# Patient Record
Sex: Female | Born: 1980
Health system: Southern US, Community
[De-identification: ages and names within clinical notes are randomized; demographics above are authoritative.]

## PROBLEM LIST (undated history)

## (undated) ENCOUNTER — Inpatient Hospital Stay (HOSPITAL_COMMUNITY): Payer: Self-pay

## (undated) DIAGNOSIS — F419 Anxiety disorder, unspecified: Secondary | ICD-10-CM

## (undated) DIAGNOSIS — I639 Cerebral infarction, unspecified: Secondary | ICD-10-CM

## (undated) DIAGNOSIS — D649 Anemia, unspecified: Secondary | ICD-10-CM

## (undated) DIAGNOSIS — R002 Palpitations: Secondary | ICD-10-CM

## (undated) DIAGNOSIS — R011 Cardiac murmur, unspecified: Secondary | ICD-10-CM

## (undated) DIAGNOSIS — R569 Unspecified convulsions: Secondary | ICD-10-CM

## (undated) DIAGNOSIS — G459 Transient cerebral ischemic attack, unspecified: Secondary | ICD-10-CM

## (undated) DIAGNOSIS — R079 Chest pain, unspecified: Secondary | ICD-10-CM

## (undated) HISTORY — DX: Transient cerebral ischemic attack, unspecified: G45.9

## (undated) HISTORY — DX: Palpitations: R00.2

## (undated) HISTORY — DX: Cardiac murmur, unspecified: R01.1

## (undated) HISTORY — DX: Unspecified convulsions: R56.9

## (undated) HISTORY — PX: OTHER SURGICAL HISTORY: SHX169

## (undated) HISTORY — PX: ABDOMINAL HYSTERECTOMY: SHX81

## (undated) HISTORY — DX: Anxiety disorder, unspecified: F41.9

## (undated) HISTORY — DX: Chest pain, unspecified: R07.9

---
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
## 1999-05-31 ENCOUNTER — Other Ambulatory Visit: Admission: RE | Admit: 1999-05-31 | Discharge: 1999-05-31 | Payer: Self-pay | Admitting: *Deleted

## 2000-06-01 ENCOUNTER — Other Ambulatory Visit: Admission: RE | Admit: 2000-06-01 | Discharge: 2000-06-01 | Payer: Self-pay | Admitting: *Deleted

## 2001-11-20 ENCOUNTER — Emergency Department (HOSPITAL_COMMUNITY): Admission: EM | Admit: 2001-11-20 | Discharge: 2001-11-21 | Payer: Self-pay | Admitting: Emergency Medicine

## 2001-11-21 ENCOUNTER — Encounter: Payer: Self-pay | Admitting: Emergency Medicine

## 2002-07-31 ENCOUNTER — Emergency Department (HOSPITAL_COMMUNITY): Admission: EM | Admit: 2002-07-31 | Discharge: 2002-07-31 | Payer: Self-pay

## 2004-09-20 ENCOUNTER — Inpatient Hospital Stay (HOSPITAL_COMMUNITY): Admission: AC | Admit: 2004-09-20 | Discharge: 2004-09-25 | Payer: Self-pay

## 2004-10-19 ENCOUNTER — Ambulatory Visit (HOSPITAL_COMMUNITY): Admission: RE | Admit: 2004-10-19 | Discharge: 2004-10-20 | Payer: Self-pay | Admitting: Orthopedic Surgery

## 2004-12-17 ENCOUNTER — Other Ambulatory Visit: Admission: RE | Admit: 2004-12-17 | Discharge: 2004-12-17 | Payer: Self-pay | Admitting: Obstetrics and Gynecology

## 2004-12-28 ENCOUNTER — Emergency Department (HOSPITAL_COMMUNITY): Admission: EM | Admit: 2004-12-28 | Discharge: 2004-12-28 | Payer: Self-pay | Admitting: Emergency Medicine

## 2005-02-05 ENCOUNTER — Emergency Department (HOSPITAL_COMMUNITY): Admission: EM | Admit: 2005-02-05 | Discharge: 2005-02-05 | Payer: Self-pay | Admitting: *Deleted

## 2008-05-30 ENCOUNTER — Other Ambulatory Visit: Admission: RE | Admit: 2008-05-30 | Discharge: 2008-05-30 | Payer: Self-pay | Admitting: Family Medicine

## 2009-03-13 ENCOUNTER — Emergency Department (HOSPITAL_COMMUNITY): Admission: EM | Admit: 2009-03-13 | Discharge: 2009-03-13 | Payer: Self-pay | Admitting: Emergency Medicine

## 2010-03-11 ENCOUNTER — Emergency Department (HOSPITAL_COMMUNITY): Admission: EM | Admit: 2010-03-11 | Discharge: 2010-03-12 | Payer: Self-pay | Admitting: Emergency Medicine

## 2010-09-17 ENCOUNTER — Encounter: Payer: Self-pay | Admitting: Family Medicine

## 2010-09-27 ENCOUNTER — Encounter: Payer: Self-pay | Admitting: Family Medicine

## 2010-11-03 ENCOUNTER — Other Ambulatory Visit: Payer: Self-pay | Admitting: Family Medicine

## 2010-11-03 MED ORDER — FLUCONAZOLE 150 MG PO TABS: 150.0000 mg | ORAL_TABLET | Freq: Once | ORAL | Status: AC

## 2010-12-30 ENCOUNTER — Other Ambulatory Visit: Payer: Self-pay | Admitting: Family Medicine

## 2010-12-30 MED ORDER — AMOXICILLIN 500 MG PO CAPS: 500.0000 mg | ORAL_CAPSULE | Freq: Two times a day (BID) | ORAL | Status: AC
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
## 2010-12-31 ENCOUNTER — Ambulatory Visit (INDEPENDENT_AMBULATORY_CARE_PROVIDER_SITE_OTHER): Payer: 59 | Admitting: Family Medicine

## 2010-12-31 VITALS — BP 137/89 | HR 95 | Ht 67.0 in | Wt 150.0 lb

## 2010-12-31 DIAGNOSIS — R569 Unspecified convulsions: Secondary | ICD-10-CM | POA: Insufficient documentation

## 2010-12-31 MED ORDER — TRAMADOL HCL 50 MG PO TABS: ORAL_TABLET | ORAL | Status: AC

## 2010-12-31 NOTE — Progress Notes (Signed)
  Subjective:    Patient ID: Anita [name], female    DOB: 1981/05/25, 30 y.o.   MRN: [id number]  HPI  Tongue pain We had started her on abx for her skin scrapes and thiose are less red---her tongue is still really sore...more no than initially---where she but it during sz  Review of Systems Pertinent review of systems: negative for fever or unusual weight change.     Objective:   Physical Exam    WDWNNAD OP tip of tongue has linear laceration that does not appear infected. NECK no LAD SKIN Decreased erythema around her various scrapes    Assessment & Plan:  Tongue laceration conintue oral rinses tid and tramadol for pain
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
## 2011-01-26 ENCOUNTER — Other Ambulatory Visit: Payer: Self-pay | Admitting: Family Medicine

## 2011-01-26 DIAGNOSIS — B9689 Other specified bacterial agents as the cause of diseases classified elsewhere: Secondary | ICD-10-CM | POA: Insufficient documentation

## 2011-01-26 DIAGNOSIS — N760 Acute vaginitis: Secondary | ICD-10-CM | POA: Insufficient documentation

## 2011-01-26 MED ORDER — CLINDAMYCIN PHOSPHATE 2 % VA CREA: 1.0000 | TOPICAL_CREAM | Freq: Every day | VAGINAL | Status: DC

## 2011-02-02 ENCOUNTER — Ambulatory Visit (INDEPENDENT_AMBULATORY_CARE_PROVIDER_SITE_OTHER): Payer: 59 | Admitting: Family Medicine

## 2011-02-02 DIAGNOSIS — B3731 Acute candidiasis of vulva and vagina: Secondary | ICD-10-CM

## 2011-02-02 DIAGNOSIS — B373 Candidiasis of vulva and vagina: Secondary | ICD-10-CM

## 2011-02-02 MED ORDER — TERCONAZOLE 0.4 % VA CREA: 1.0000 | TOPICAL_CREAM | Freq: Every day | VAGINAL | Status: AC

## 2011-02-02 NOTE — Progress Notes (Signed)
  Subjective:    Patient ID: Anita [name], female    DOB: 02-25-1981, 30 y.o.   MRN: [id number]  HPI  Several days of itchy vaginal d/c. Sex was painful as skin was irritated. Finished the cliindamycin.   Review of Systems    Pertinent review of systems: negative for fever or unusual weight change.  Objective:   Physical Exam    GU: red vular area with thick white d/c    Assessment & Plan:  Yeast vaginitis terrazol 7 d

## 2011-02-16 ENCOUNTER — Ambulatory Visit (INDEPENDENT_AMBULATORY_CARE_PROVIDER_SITE_OTHER): Payer: 59 | Admitting: Family Medicine

## 2011-02-17 ENCOUNTER — Other Ambulatory Visit: Payer: Self-pay | Admitting: Family Medicine

## 2011-02-17 DIAGNOSIS — G43909 Migraine, unspecified, not intractable, without status migrainosus: Secondary | ICD-10-CM | POA: Insufficient documentation

## 2011-02-17 DIAGNOSIS — R569 Unspecified convulsions: Secondary | ICD-10-CM

## 2011-02-17 MED ORDER — LAMOTRIGINE 200 MG PO TABS: 200.0000 mg | ORAL_TABLET | Freq: Two times a day (BID) | ORAL | Status: DC

## 2011-02-17 MED ORDER — KETOROLAC TROMETHAMINE 30 MG/ML IJ SOLN
30.0000 mg | Freq: Once | INTRAMUSCULAR | Status: AC
  Administered 2011-02-17: 30 mg via INTRAMUSCULAR

## 2011-02-17 NOTE — Assessment & Plan Note (Signed)
Pt is at work and feels like she has a seizure coming on. She does not have her meds with her and needs 1 Lamictal tablet.  Sent to [facility] outpt pharmacy now and she will go pick it up now.

## 2011-02-17 NOTE — Progress Notes (Signed)
Anita [name] describes a migraine HA all day, unilateral on the left, associated with nausea.  She is at work and she just spoke with her neurologist who plans to place her on Imitrex.  She requested intervention.  I am uncomfortable with ordering injectable Imitrex as that is the only triptan we have in stock.  Will have the clinic staff administer Toradol 30 mg IM once.

## 2011-02-21 ENCOUNTER — Ambulatory Visit (INDEPENDENT_AMBULATORY_CARE_PROVIDER_SITE_OTHER): Payer: 59 | Admitting: Family Medicine

## 2011-02-21 ENCOUNTER — Encounter: Payer: Self-pay | Admitting: Family Medicine

## 2011-02-21 VITALS — BP 120/78 | Wt 151.0 lb

## 2011-02-21 DIAGNOSIS — N898 Other specified noninflammatory disorders of vagina: Secondary | ICD-10-CM

## 2011-02-21 DIAGNOSIS — IMO0002 Reserved for concepts with insufficient information to code with codable children: Secondary | ICD-10-CM | POA: Insufficient documentation

## 2011-02-21 LAB — POCT WET PREP (WET MOUNT)

## 2011-02-21 NOTE — Patient Instructions (Signed)
Recommend OTC yeast cream for 3 nights (miconazole or clotrimazole cream, not suppository)  Only use KY jelly, do not use scented lubricants Recommend not aggressively shaving, this is irritating.

## 2011-02-21 NOTE — Miscellaneous (Signed)
Summary: Metronidazole  Clinical Lists Changes  Medications: Added new medication of METRONIDAZOLE 500 MG TABS (METRONIDAZOLE) 1 Po BId x 14 days. - Signed Rx of METRONIDAZOLE 500 MG TABS (METRONIDAZOLE) 1 Po BId x 14 days.;  #28 x 0;  Signed;  Entered by: Jamie Brookes MD;  Authorized by: Jamie Brookes MD;  Method used: Electronically to [facility]*, [address] Shipping/mailing, [place], [place]  [address], Ph: [phone], Fax: [phone]    Prescriptions: METRONIDAZOLE 500 MG TABS (METRONIDAZOLE) 1 Po BId x 14 days.  #28 x 0   Entered and Authorized by:   Jamie Brookes MD   Signed by:   Jamie Brookes MD on 09/27/2010   Method used:   Electronically to        [facility]* (retail)       [address]       [address] [place] [address]mailing       [place], [place]  [address]       Ph: [phone]       Fax: [phone]   RxID:   [id number]

## 2011-02-21 NOTE — Progress Notes (Signed)
  Subjective:    Patient ID: Anita [name], female    DOB: [birth year], 30 y.o.   MRN: [id number]  HPI Complaining or redness and itching in vaginal area.  Discomfort with sexual intercourse.  Boyfriend has irritation at the meatus.  She had a vaginal yeast infection treated recently.  She would like STD testing.       Review of Systems  Constitutional: Negative for fever.  Genitourinary: Positive for vaginal pain, pelvic pain and dyspareunia. Negative for dysuria and vaginal discharge.       Objective:   Physical Exam  Constitutional: She appears well-developed and well-nourished.  Genitourinary:       Externally:  Shaved pubic hair and overall redness of external and internal labia.  No discharge in vaginal canal or from cervical os.  Tender during swabbing of cervix.  No pain with bimanual exam.  Negative wet prep.          Assessment & Plan:

## 2011-02-21 NOTE — Assessment & Plan Note (Signed)
Recent treatment for vaginal candidiasis, and weekend of sexual activity.  Recommended the following: use of OTC vaginal yeast cream for 3 nights to provide comfort.  Use of KY jelly only, not scented or warming lubricants.  Recommended not to shave pubic hair as it is a protective barrier.  Advised to have less trauma during intercourse.

## 2011-02-21 NOTE — Miscellaneous (Signed)
  Clinical Lists Changes Wet prep + yeast; using OTC cream that is causing increased irritation. Medications: Added new medication of FLUCONAZOLE 150 MG TABS (FLUCONAZOLE) one today and one in 2 days - Signed Rx of FLUCONAZOLE 150 MG TABS (FLUCONAZOLE) one today and one in 2 days;  #2 x 1;  Signed;  Entered by: Luretha Murphy NP;  Authorized by: Luretha Murphy NP;  Method used: Electronically to [facility]*, [address] Shipping/mailing, [place], [place]  [address], Ph: [phone], Fax: [phone]    Prescriptions: FLUCONAZOLE 150 MG TABS (FLUCONAZOLE) one today and one in 2 days  #2 x 1   Entered and Authorized by:   Luretha Murphy NP   Signed by:   Luretha Murphy NP on 09/17/2010   Method used:   Electronically to        [facility]* (retail)       [address]       [address] Shipping/mailing       [place], [place]  [address]       Ph: [phone]       Fax: [phone]   RxID:   [id number]

## 2011-02-22 LAB — GC/CHLAMYDIA PROBE AMP, GENITAL: Chlamydia, DNA Probe: NEGATIVE

## 2011-03-01 NOTE — Progress Notes (Signed)
  Subjective:    Patient ID: Anita [name], female    DOB: 1981-02-08, 30 y.o.   MRN: [id number]  HPI  Appointment cancelled  Review of Systems     Objective:   Physical Exam        Assessment & Plan:

## 2011-04-07 ENCOUNTER — Other Ambulatory Visit: Payer: Self-pay | Admitting: Family Medicine

## 2011-04-07 ENCOUNTER — Encounter: Payer: Self-pay | Admitting: Family Medicine

## 2011-04-07 ENCOUNTER — Emergency Department (HOSPITAL_COMMUNITY)
Admission: EM | Admit: 2011-04-07 | Discharge: 2011-04-07 | Disposition: A | Discharge status: Home / Self Care | Payer: 59 | Attending: Emergency Medicine | Admitting: Emergency Medicine

## 2011-04-07 DIAGNOSIS — R569 Unspecified convulsions: Secondary | ICD-10-CM

## 2011-04-07 DIAGNOSIS — R51 Headache: Secondary | ICD-10-CM | POA: Insufficient documentation

## 2011-04-07 DIAGNOSIS — G40909 Epilepsy, unspecified, not intractable, without status epilepticus: Secondary | ICD-10-CM | POA: Insufficient documentation

## 2011-04-07 DIAGNOSIS — Z79899 Other long term (current) drug therapy: Secondary | ICD-10-CM | POA: Insufficient documentation

## 2011-04-07 LAB — CBC
HCT: 40.8 % (ref 36.0–46.0)
Hemoglobin: 14 g/dL (ref 12.0–15.0)
MCHC: 34.3 g/dL (ref 30.0–36.0)
RBC: 4.6 MIL/uL (ref 3.87–5.11)

## 2011-04-07 LAB — DIFFERENTIAL
Basophils Absolute: 0 10*3/uL (ref 0.0–0.1)
Lymphocytes Relative: 27 % (ref 12–46)
Monocytes Absolute: 0.9 10*3/uL (ref 0.1–1.0)
Monocytes Relative: 11 % (ref 3–12)
Neutro Abs: 4.9 10*3/uL (ref 1.7–7.7)

## 2011-04-07 LAB — BASIC METABOLIC PANEL
BUN: 8 mg/dL (ref 6–23)
CO2: 24 mEq/L (ref 19–32)
Chloride: 102 mEq/L (ref 96–112)
GFR calc non Af Amer: >60 mL/min (ref >60)
Glucose, Bld: 89 mg/dL (ref 70–99)
Potassium: 3.5 mEq/L (ref 3.5–5.1)

## 2011-04-07 MED ORDER — CLONAZEPAM 0.5 MG PO TABS: 0.5000 mg | ORAL_TABLET | Freq: Every evening | ORAL | Status: DC | PRN

## 2011-04-07 MED ORDER — LORAZEPAM 2 MG/ML IJ SOLN
2.0000 mg | Freq: Once | INTRAMUSCULAR | Status: AC
  Administered 2011-04-07: 2 mg via INTRAMUSCULAR

## 2011-04-07 MED ORDER — LACOSAMIDE 50 MG PO TABS: 50.0000 mg | ORAL_TABLET | Freq: Two times a day (BID) | ORAL | Status: DC

## 2011-04-07 MED ORDER — LAMOTRIGINE 200 MG PO TABS: ORAL_TABLET | ORAL | Status: DC

## 2011-04-07 NOTE — Progress Notes (Signed)
Seen by me at [facility] with seizurea activity this am.   Had two episodes of "blacking out" un witnessed as she was riding bus to work. Then at work she developed headache and aura and had seizure activity x2.  Loss of bladder. Post ictal.  1st episode lasted a few minutes per witnesses and second lasted about 20 minutes--she received 2 mg lorazepam Im injected by me at 15 min or so into her last episode.  She did not fall--her co workers noticed she was acting odd and were there to help her to floor and place pillow under her head.  Specific pertinents on ROS.Denies any recent illness, had headache this am bu that is frequent part of her aura per patient. Denies any recent fever, cough, no dysuria no sweats, no nausea or Gi upset. No alcohol in may months. Is taking her lamictal regularly. Has had no migraines in last few weeks. No new stressors, no change in sleep patter or eating habits. No weight change. No menstrual irregularities. ROS is otherwise negative.  PE: BP during sz was 200 /110 and post ictal was 140 / 80.   GENERALl: Well developed, well nourished, in no acute distress. NECK: Supple, FROM, without lymphadenopathy.   PERRLA EOMI. nonicteric sclera THYROID: normal without nodularity CAROTID ARTERIES: without bruits LUNGS: clear to auscultation bilaterally. No wheezes or rales. HEART: Regular rate and rhythm, no murmurs ABDOMEN: soft with positive bowel sounds SKIN no rash. Tatoos. Normal color and normal cap refill PSTCH A xO x 4 atthis time. Interactive and asking and answering questions appropriately. NEURO: No gross focal deficits after she recovered from post ictal period.

## 2011-04-07 NOTE — Progress Notes (Signed)
Lorazepam  2 mg  IM drawn up by Arlyss Repress CMA and  administered by Dr. Jennette Kettle.

## 2011-04-07 NOTE — Progress Notes (Signed)
Addended by: Orvil Feil L on: 04/07/2011 08:53 AM   Modules accepted: Orders

## 2011-04-07 NOTE — Progress Notes (Signed)
  Subjective:    Patient ID: Anita [name], female    DOB: Jul 26, 1981, 30 y.o.   MRN: [id number]  HPI  She reports additional med changes of increase inlamictal 200 am and 300pm with klonopin 0.5 qhs.   Review of Systems     Objective:   Physical Exam        Assessment & Plan:

## 2011-04-07 NOTE — Assessment & Plan Note (Signed)
tnsf to ED where we will get CBC, UA, UDS, lanmictal level. Her Mom is on her way over. She is resting comfortabley. SPoke w her neurologist Dr Terrace Arabia and see pt instructions for our treatment plan. Add vimpat 50 bid and she has f/u Dr Terrace Arabia already scheduled end of September. She will be transported home by her mom.

## 2011-04-07 NOTE — Patient Instructions (Addendum)
I have spoke with Dr Eldridge Scot [phone] and advised her of seizure activity this am .Dr Terrace Arabia suggests we add vimpat 5o bid and Norma keeps her existing appt at end of September.  I will forard lamictal level to Dr Terrace Arabia. Dr Terrace Arabia asks Emmogene to call her office in interim with any new issues or concerns. She can also follow up at [facility] with me, Dr Jennette Kettle.

## 2011-04-07 NOTE — Progress Notes (Signed)
30 yo female with known seizure disorder who had two witnessed seizures this morning upon arrival to work at [facility] center. By report she had two "black out" episodes on the bus on the way to work this morning.  Her first seizure lasted over 1 minute and was generalized tonic clonic. There was an interval of postictal confusion for around 2 mins followed by another seizure which lasted about 1 minute. Following this seizure Anita [name] had continued confusion but improving mental status. She was given 2mg  im ativan and EMS was called for transfer to the ED.   Patient Active Problem List  Diagnoses  . Seizure  . Migraine  . Dyspareunia    Current Outpatient Prescriptions on File Prior to Visit  Medication Sig Dispense Refill  . lamoTRIgine (LAMICTAL) 200 MG tablet Take 1 tablet (200 mg total) by mouth 2 (two) times daily.  1 tablet  0

## 2011-04-10 LAB — LAMOTRIGINE LEVEL: Lamotrigine Lvl: 8.6 ug/mL (ref 4.0–18.0)

## 2011-05-31 ENCOUNTER — Other Ambulatory Visit: Payer: Self-pay | Admitting: Family Medicine

## 2011-11-03 ENCOUNTER — Other Ambulatory Visit: Payer: Self-pay | Admitting: Family Medicine

## 2011-11-03 ENCOUNTER — Other Ambulatory Visit (INDEPENDENT_AMBULATORY_CARE_PROVIDER_SITE_OTHER): Payer: 59

## 2011-11-03 DIAGNOSIS — N760 Acute vaginitis: Secondary | ICD-10-CM

## 2011-11-03 LAB — POCT WET PREP (WET MOUNT)

## 2011-11-03 MED ORDER — TERCONAZOLE 0.4 % VA CREA: 1.0000 | TOPICAL_CREAM | Freq: Every day | VAGINAL | Status: AC

## 2011-11-03 NOTE — Progress Notes (Signed)
Vaginal itching w d/c, burning. Wet prep done the other day showed yeast, she had diflucan x 2 and no improvement. Exam c/w yeast with white d/c, reddened vestibule. No lesions that would be concerning for herpes. Will try terazol to get C. Lillia Corporal

## 2012-04-17 ENCOUNTER — Ambulatory Visit (INDEPENDENT_AMBULATORY_CARE_PROVIDER_SITE_OTHER): Payer: 59 | Admitting: Family Medicine

## 2012-04-17 VITALS — HR 75 | Temp 98.0°F | Resp 18

## 2012-04-17 DIAGNOSIS — N949 Unspecified condition associated with female genital organs and menstrual cycle: Secondary | ICD-10-CM

## 2012-04-17 DIAGNOSIS — R102 Pelvic and perineal pain: Secondary | ICD-10-CM | POA: Insufficient documentation

## 2012-04-17 NOTE — Assessment & Plan Note (Addendum)
With positive finding of right adnexal mass and mild cervical motion tenderness we will consider in the differential ectopic pregnancy, anatomic pathology (cysts) and PID. Ectopic Pregnancy: Pt just came of her period and denies the possibility of pregnancy. Anatomic pathology: pt has history of ovarian cysts and with mass palpated this could be a possibility. Mild to moderated PID: pt with symptoms and signs that can be consistent with this diagnosis. At this time pt does not seem to have an acute abdomen.  Plan: Dicussed signs and symptoms of worsening condition and the need of reevaluation if this happens.  Pelvic ultrasound and CBC w/diff. If by u/s ectopic is concerning will obtain Bhcg quantification. Due to the absence of fever, signs of peritonitis and PO tolerability I will be incline to treat PID as outpatient basis. The mass palpated is worrisome for possibility of tubal/ovary involvement. Ultrasound will guide [place] more in this respect. The antibiotic regimen for this condition will be Ceftriaxone 250 mg IM x1. Plus Metronidazole 500 mg PO BID and Doxycycline 100 mg PO BID for 14 days course.

## 2012-04-17 NOTE — Progress Notes (Signed)
  Subjective:    Patient ID: Anita [name], female    DOB: Aug 27, 1981, 31 y.o.   MRN: [id number]  HPI Pt seen today for pelvic pain. She has been having abdominal/pelvic discomfort for a while sometimes before her periods others after her menses. At this time the pain started after her LMP. Pain is described as dull with the sensation of "something hardened/inflamed inside". Pain exacerbates with sexual intercourse. She also complaints of mild to moderated white vaginal discharge, denies vaginal bleeding.She mentions that when she was around 31 y/o was diagnosed with ovarian cysts and has not had recent follow up. She Pt denies fever or chills, nausea or vomiting.  Review of Systems Per HPI    Objective:   Physical Exam  Constitutional: She is oriented to person, place, and time. No distress.  Cardiovascular: Normal rate.   Pulmonary/Chest: Effort normal.  Abdominal: Soft. She exhibits no distension.  Genitourinary:       Speculum: mild thin vaginal discharge. Nulliparous OS. No cervical erythema or friability. Bimanual exam: very posterior cervix, mild cervical motion tenderness. Tender mass of approximately 2cm  palpated on the right adnexa. Left adnexa non palpable. Uterus normal size.   Musculoskeletal: She exhibits no edema.  Neurological: She is alert and oriented to person, place, and time.       Assessment & Plan:

## 2012-04-18 ENCOUNTER — Other Ambulatory Visit (INDEPENDENT_AMBULATORY_CARE_PROVIDER_SITE_OTHER): Payer: 59 | Admitting: Family Medicine

## 2012-04-18 ENCOUNTER — Other Ambulatory Visit: Payer: Self-pay | Admitting: Family Medicine

## 2012-04-18 DIAGNOSIS — R102 Pelvic and perineal pain: Secondary | ICD-10-CM

## 2012-04-19 ENCOUNTER — Encounter: Payer: Self-pay | Admitting: Family Medicine

## 2012-04-19 ENCOUNTER — Ambulatory Visit (HOSPITAL_COMMUNITY)
Admission: RE | Admit: 2012-04-19 | Discharge: 2012-04-19 | Disposition: A | Discharge status: Home / Self Care | Payer: 59 | Source: Ambulatory Visit | Attending: Family Medicine | Admitting: Family Medicine

## 2012-04-19 DIAGNOSIS — R102 Pelvic and perineal pain: Secondary | ICD-10-CM

## 2012-04-19 DIAGNOSIS — N949 Unspecified condition associated with female genital organs and menstrual cycle: Secondary | ICD-10-CM | POA: Insufficient documentation

## 2012-04-19 LAB — CBC WITH DIFFERENTIAL/PLATELET
Basophils Absolute: 0.1 10*3/uL (ref 0.0–0.1)
Basophils Relative: 1 % (ref 0–1)
Eosinophils Relative: 2 % (ref 0–5)
HCT: 38.9 % (ref 36.0–46.0)
Hemoglobin: 13.2 g/dL (ref 12.0–15.0)
MCH: 29.9 pg (ref 26.0–34.0)
MCHC: 33.9 g/dL (ref 30.0–36.0)
MCV: 88 fL (ref 78.0–100.0)
Monocytes Absolute: 1 10*3/uL (ref 0.1–1.0)
Monocytes Relative: 12 % (ref 3–12)
RDW: 12.8 % (ref 11.5–15.5)

## 2012-04-19 MED ORDER — DOXYCYCLINE HYCLATE 100 MG PO TABS: 100.0000 mg | ORAL_TABLET | Freq: Two times a day (BID) | ORAL | Status: AC

## 2012-04-19 MED ORDER — CEFTRIAXONE SODIUM 250 MG IJ SOLR
250.0000 mg | Freq: Once | INTRAMUSCULAR | Status: AC
  Administered 2012-04-19: 250 mg via INTRAMUSCULAR

## 2012-04-19 MED ORDER — METRONIDAZOLE 500 MG PO TABS: 500.0000 mg | ORAL_TABLET | Freq: Two times a day (BID) | ORAL | Status: AC

## 2012-04-19 MED ORDER — CEFTRIAXONE SODIUM 250 MG IJ SOLR: 250.0000 mg | Freq: Once | INTRAMUSCULAR | Status: AC

## 2012-04-19 NOTE — Addendum Note (Signed)
Addended by: Deno Etienne on: 04/19/2012 02:02 PM   Modules accepted: Orders

## 2012-05-02 ENCOUNTER — Other Ambulatory Visit: Payer: Self-pay | Admitting: Family Medicine

## 2012-05-02 DIAGNOSIS — R102 Pelvic and perineal pain: Secondary | ICD-10-CM

## 2012-05-02 NOTE — Progress Notes (Signed)
Continued pelvic pain Increased clots with menstruation; increased dysmenorrhea PUS normal Clinically sounds like adenomyosis Will set up pelvic mri Denny Levy

## 2012-05-02 NOTE — Progress Notes (Signed)
Pt has an appt for Monday September 10 @ 445 with [facility] radiology. Pt has been made aware of this appt.Loralee Pacas [place]

## 2012-05-08 ENCOUNTER — Inpatient Hospital Stay (HOSPITAL_COMMUNITY): Admission: RE | Admit: 2012-05-08 | Payer: 59 | Source: Ambulatory Visit

## 2012-05-11 ENCOUNTER — Ambulatory Visit (HOSPITAL_COMMUNITY)
Admission: RE | Admit: 2012-05-11 | Discharge: 2012-05-11 | Disposition: A | Discharge status: Home / Self Care | Payer: 59 | Source: Ambulatory Visit | Attending: Family Medicine | Admitting: Family Medicine

## 2012-05-11 DIAGNOSIS — R102 Pelvic and perineal pain: Secondary | ICD-10-CM

## 2012-05-11 DIAGNOSIS — N949 Unspecified condition associated with female genital organs and menstrual cycle: Secondary | ICD-10-CM | POA: Insufficient documentation

## 2012-05-11 DIAGNOSIS — N72 Inflammatory disease of cervix uteri: Secondary | ICD-10-CM | POA: Insufficient documentation

## 2012-05-14 ENCOUNTER — Other Ambulatory Visit: Payer: Self-pay | Admitting: Family Medicine

## 2012-05-14 DIAGNOSIS — E282 Polycystic ovarian syndrome: Secondary | ICD-10-CM | POA: Insufficient documentation

## 2012-05-14 MED ORDER — DESOGESTREL-ETHINYL ESTRADIOL 0.15-30 MG-MCG PO TABS: 1.0000 | ORAL_TABLET | Freq: Every day | ORAL | Status: DC

## 2012-08-27 ENCOUNTER — Other Ambulatory Visit (HOSPITAL_COMMUNITY): Payer: Self-pay | Admitting: Neurology

## 2012-08-27 DIAGNOSIS — G40309 Generalized idiopathic epilepsy and epileptic syndromes, not intractable, without status epilepticus: Secondary | ICD-10-CM

## 2012-08-28 ENCOUNTER — Ambulatory Visit (HOSPITAL_COMMUNITY)
Admission: RE | Admit: 2012-08-28 | Discharge: 2012-08-28 | Disposition: A | Discharge status: Home / Self Care | Payer: 59 | Source: Ambulatory Visit | Attending: Neurology | Admitting: Neurology

## 2012-08-28 DIAGNOSIS — G40309 Generalized idiopathic epilepsy and epileptic syndromes, not intractable, without status epilepticus: Secondary | ICD-10-CM

## 2012-11-28 ENCOUNTER — Other Ambulatory Visit: Payer: Self-pay | Admitting: Family Medicine

## 2012-11-28 DIAGNOSIS — R5381 Other malaise: Secondary | ICD-10-CM

## 2012-11-28 DIAGNOSIS — R5383 Other fatigue: Secondary | ICD-10-CM

## 2012-11-29 ENCOUNTER — Other Ambulatory Visit: Payer: Self-pay | Admitting: Family Medicine

## 2012-11-29 DIAGNOSIS — R5381 Other malaise: Secondary | ICD-10-CM

## 2012-11-29 DIAGNOSIS — R5383 Other fatigue: Secondary | ICD-10-CM

## 2012-11-29 LAB — TSH: TSH: 1.55 u[IU]/mL (ref 0.350–4.500)

## 2012-12-07 ENCOUNTER — Other Ambulatory Visit: Payer: Self-pay

## 2012-12-07 NOTE — Telephone Encounter (Signed)
I did not change Lamictal dose. Continue as I previously prescribed. Will get a Lamictal level at next visit on the 28th of April.

## 2012-12-07 NOTE — Telephone Encounter (Signed)
I called patient back to relay that Eber Jones would like the patient to remain on meds as prescribed.  Patient verbalized understanding and said she would talk to CM about meds when she is in for appt.  I called pharmacy as well to advise no med change at this time.  Spoke with Brett Canales.  He is aware patient should remain on same dose.

## 2012-12-07 NOTE — Telephone Encounter (Signed)
Patient called back.  I spoke with her.  She said she is now taking Lamictal 200mg  tabs one and one half twice daily.  Says she has also cut back her Vimpat from 200mg  twice daily to 200mg  once daily.  Says she has an appt with CM later this month.  She claims she discussed the Lamictal Dose change with provider here, however I cannot seem to find anything in system regarding this.  Said she will speak with Eber Jones about Vimpat dose at her OV and the only med she needs at this time is Lamictal at new dose.  Eber Jones, please advise.  Thank you.

## 2012-12-07 NOTE — Telephone Encounter (Signed)
Pharmacy is requesting refills.  Brett Canales called [place] (from pharmacy) stating Mom called them and told them the dose has changed on Lamictal and Vimpat and they need new rx's.  I do not show a dose change in system.  I called home number, got no answer, left message.  I called alternate number [phone], it was a work line.  Got no answer, was put into VM for physicians and pharmacist.  I left a message asking that Lupita Leash call us back regarding Keyri.

## 2012-12-10 ENCOUNTER — Ambulatory Visit: Payer: 59 | Admitting: Family Medicine

## 2012-12-10 ENCOUNTER — Other Ambulatory Visit (INDEPENDENT_AMBULATORY_CARE_PROVIDER_SITE_OTHER): Payer: 59 | Admitting: *Deleted

## 2012-12-10 ENCOUNTER — Encounter (HOSPITAL_COMMUNITY): Payer: Self-pay

## 2012-12-10 ENCOUNTER — Other Ambulatory Visit: Payer: Self-pay | Admitting: Family Medicine

## 2012-12-10 ENCOUNTER — Ambulatory Visit (HOSPITAL_COMMUNITY)
Admission: RE | Admit: 2012-12-10 | Discharge: 2012-12-10 | Disposition: A | Discharge status: Home / Self Care | Payer: 59 | Source: Ambulatory Visit | Attending: Family Medicine | Admitting: Family Medicine

## 2012-12-10 DIAGNOSIS — R109 Unspecified abdominal pain: Secondary | ICD-10-CM

## 2012-12-10 DIAGNOSIS — E282 Polycystic ovarian syndrome: Secondary | ICD-10-CM

## 2012-12-10 DIAGNOSIS — N949 Unspecified condition associated with female genital organs and menstrual cycle: Secondary | ICD-10-CM | POA: Insufficient documentation

## 2012-12-10 DIAGNOSIS — R110 Nausea: Secondary | ICD-10-CM | POA: Insufficient documentation

## 2012-12-10 DIAGNOSIS — N83209 Unspecified ovarian cyst, unspecified side: Secondary | ICD-10-CM | POA: Insufficient documentation

## 2012-12-10 LAB — BASIC METABOLIC PANEL
BUN: 11 mg/dL (ref 6–23)
CO2: 29 mEq/L (ref 19–32)
Calcium: 9.7 mg/dL (ref 8.4–10.5)
Chloride: 99 mEq/L (ref 96–112)
Creat: 0.71 mg/dL (ref 0.50–1.10)

## 2012-12-10 MED ORDER — IOHEXOL 300 MG/ML  SOLN
80.0000 mL | Freq: Once | INTRAMUSCULAR | Status: AC | PRN
  Administered 2012-12-10: 80 mL via INTRAVENOUS

## 2012-12-24 ENCOUNTER — Ambulatory Visit (INDEPENDENT_AMBULATORY_CARE_PROVIDER_SITE_OTHER): Payer: 59 | Admitting: Nurse Practitioner

## 2012-12-24 ENCOUNTER — Encounter: Payer: Self-pay | Admitting: Nurse Practitioner

## 2012-12-24 VITALS — BP 103/64 | HR 77 | Ht 67.5 in | Wt 158.0 lb

## 2012-12-24 DIAGNOSIS — Z79899 Other long term (current) drug therapy: Secondary | ICD-10-CM

## 2012-12-24 DIAGNOSIS — Z5181 Encounter for therapeutic drug level monitoring: Secondary | ICD-10-CM | POA: Insufficient documentation

## 2012-12-24 DIAGNOSIS — G40309 Generalized idiopathic epilepsy and epileptic syndromes, not intractable, without status epilepticus: Secondary | ICD-10-CM

## 2012-12-24 MED ORDER — LAMOTRIGINE 200 MG PO TABS: 300.0000 mg | ORAL_TABLET | Freq: Two times a day (BID) | ORAL | Status: DC

## 2012-12-24 MED ORDER — LACOSAMIDE 200 MG PO TABS: 200.0000 mg | ORAL_TABLET | Freq: Every day | ORAL | Status: DC

## 2012-12-24 NOTE — Patient Instructions (Addendum)
Patient to continue Lamictal 300 mg twice daily, meds to be renewed Patient to continue Vimpat 200 daily at bedtime Continue Klonopin 0.5 at bedtime as needed Patient to come into the office in May to get labs done, Lamictal level Followup in 6 months

## 2012-12-24 NOTE — Progress Notes (Signed)
HPI: Returns for followup after her last visit 08/24/2012. She has a generalized seizure disorder. Date of last seizure 08/24/2012 when she hit her head on the table after having a seizure. CT of the head was normal. She denies further staring spells, confusion, sleep disturbances, lapses of time, headache and bowel and bladder incontinence. She continues to work full-time at [employer] . No new neurologic complaints   ROS: - negative   Physical Exam General: well developed, well nourished, seated, in no evident distress Head: head normocephalic and atraumatic. Oropharynx benign Neck: supple with no carotid or supraclavicular bruits Cardiovascular: regular rate and rhythm, no murmurs  Neurologic Exam Mental Status: Awake and fully alert. Oriented to place and time. Recent and remote memory intact. Attention span, concentration and fund of knowledge appropriate. Mood and affect appropriate.  Cranial Nerves:  Pupils equal, briskly reactive to light. Extraocular movements full without nystagmus. Visual fields full to confrontation. Hearing intact and symmetric to finger snap. Facial sensation intact. Face, tongue, palate move normally and symmetrically. Neck flexion and extension normal.  Motor: Normal bulk and tone. Normal strength in all tested extremity muscles. Sensory.: intact to touch and pinprick and vibratory.  Coordination: Rapid alternating movements normal in all extremities. Finger-to-nose and heel-to-shin performed accurately bilaterally. Gait and Station: Arises from chair without difficulty. Stance is normal. Gait demonstrates normal stride length and balance . Able to heel, toe and tandem walk without difficulty.  Reflexes: 1+ and symmetric. Toes downgoing.     ASSESSMENT: Generalized seizure disorder last seizure being 08/24/2012. Patient changed her Lamictal dose to 300 twice a day and is only taking Vimpat 200 daily instead of twice daily. She feels this combination  has less side effects for her.     PLAN: Patient to continue Lamictal 300 mg twice daily, meds to be renewed Patient to continue Vimpat 200 daily at bedtime Continue Klonopin 0.5 at bedtime as needed Patient to come into the office in May (in several weeks to get labs done, Lamictal level. She has already taken dose today.  Followup in 6 months  Nilda Riggs, [facility] APRN

## 2012-12-27 ENCOUNTER — Telehealth: Payer: Self-pay | Admitting: *Deleted

## 2012-12-27 NOTE — Telephone Encounter (Signed)
Lanora Manis with [facility] is calling because she needs to know if you met to send in the generic Lamictal? The patient has always been on the brand name and they need to make sure. Please call Lanora Manis at [phone] to clarify.

## 2012-12-27 NOTE — Telephone Encounter (Signed)
Call to [facility], needs brand Lamictal.

## 2013-01-29 ENCOUNTER — Other Ambulatory Visit: Payer: Self-pay | Admitting: Neurology

## 2013-03-20 ENCOUNTER — Other Ambulatory Visit: Payer: Self-pay | Admitting: *Deleted

## 2013-03-20 DIAGNOSIS — N760 Acute vaginitis: Secondary | ICD-10-CM

## 2013-03-20 MED ORDER — CLINDAMYCIN PHOSPHATE 2 % VA CREA: 1.0000 | TOPICAL_CREAM | Freq: Every day | VAGINAL | Status: AC

## 2013-03-25 ENCOUNTER — Encounter: Payer: Self-pay | Admitting: Family Medicine

## 2013-05-10 ENCOUNTER — Other Ambulatory Visit: Payer: Self-pay | Admitting: Sports Medicine

## 2013-05-10 ENCOUNTER — Ambulatory Visit (HOSPITAL_COMMUNITY)
Admission: RE | Admit: 2013-05-10 | Discharge: 2013-05-10 | Disposition: A | Discharge status: Home / Self Care | Payer: 59 | Source: Ambulatory Visit | Attending: Family Medicine | Admitting: Family Medicine

## 2013-05-10 DIAGNOSIS — R05 Cough: Secondary | ICD-10-CM | POA: Insufficient documentation

## 2013-05-10 DIAGNOSIS — R042 Hemoptysis: Secondary | ICD-10-CM

## 2013-05-10 DIAGNOSIS — R079 Chest pain, unspecified: Secondary | ICD-10-CM | POA: Insufficient documentation

## 2013-05-10 DIAGNOSIS — R059 Cough, unspecified: Secondary | ICD-10-CM | POA: Insufficient documentation

## 2013-05-10 NOTE — Progress Notes (Signed)
1 day hx of 3 episodes of hemoptysis. No chest pain, no LE swelling, no recent travel. No hypoxia, normal HR in clinic (97% and 72) Lung Sounds clear.  Hx of night sweats, no weight loss. CXR to ensure no occult PNA, infectious etiology.   Discussed red flags for further follow up.  No concerns for PE but must consider if persistent symptoms; well's score of 1 due to hemoptysis.  Discussed with Dr. Salem Caster, DO [facility] Resident - PGY-3 05/10/2013 12:41 PM

## 2013-06-25 ENCOUNTER — Ambulatory Visit (INDEPENDENT_AMBULATORY_CARE_PROVIDER_SITE_OTHER): Payer: 59 | Admitting: Nurse Practitioner

## 2013-06-25 ENCOUNTER — Encounter (INDEPENDENT_AMBULATORY_CARE_PROVIDER_SITE_OTHER): Payer: Self-pay

## 2013-06-25 ENCOUNTER — Encounter: Payer: Self-pay | Admitting: Nurse Practitioner

## 2013-06-25 VITALS — BP 102/67 | HR 77 | Ht 67.5 in | Wt 165.0 lb

## 2013-06-25 DIAGNOSIS — Z79899 Other long term (current) drug therapy: Secondary | ICD-10-CM

## 2013-06-25 DIAGNOSIS — G40309 Generalized idiopathic epilepsy and epileptic syndromes, not intractable, without status epilepticus: Secondary | ICD-10-CM

## 2013-06-25 MED ORDER — CLONAZEPAM 0.5 MG PO TABS: 0.5000 mg | ORAL_TABLET | Freq: Every evening | ORAL | Status: DC | PRN

## 2013-06-25 NOTE — Patient Instructions (Signed)
Trough Lamictal level Continue Lamictal and Vimpat at current doses.  Will refill Klonopin F/U in 6 months

## 2013-06-25 NOTE — Progress Notes (Signed)
[facility]  PATIENT: Anita [name] DOB: 1981/01/28   REASON FOR VISIT: follow up for seizure disorder   HISTORY OF PRESENT ILLNESS:Anita [name] returns for follow up. Anita [name] is a  32 year old female with  a generalized seizure disorder. Date of last seizure 08/24/2012 when she hit her head on the table after having a seizure. CT of the head was normal. She denies further staring spells, confusion, sleep disturbances, lapses of time, headache and bowel and bladder incontinence. She continues to work full-time at [employer] . She did not get her Lamictal level after last visit. She is not driving.    REVIEW OF SYSTEMS: Full 14 system review of systems performed and notable only for:  Constitutional: N/A  Cardiovascular: N/A  Ear/Nose/Throat: N/A  Skin: N/A  Eyes: N/A  Respiratory: N/A  Gastroitestinal: N/A  Hematology/Lymphatic: N/A  Endocrine: N/A Musculoskeletal:N/A  Allergy/Immunology: N/A  Neurological: N/A Psychiatric: N/A   ALLERGIES: Allergies  Allergen Reactions  . Dye Fdc Red [Dye Fdc Red 3 (Erythrosine)]   . Flagyl [Metronidazole Hcl]     GI upset  . Morphine And Related     vomitting    HOME MEDICATIONS: Outpatient Prescriptions Prior to Visit  Medication Sig Dispense Refill  . clonazePAM (KLONOPIN) 0.5 MG tablet TAKE 1 TABLET BY MOUTH AT NIGHT, MAY REPEAT IF NEEDED FOR INSOMNIA  45 tablet  3  . lacosamide (VIMPAT) 200 MG TABS Take 1 tablet (200 mg total) by mouth at bedtime.  30 tablet  6  . lamoTRIgine (LAMICTAL) 200 MG tablet Take 300 mg by mouth 2 (two) times daily. BRAND Medically Necessary       No facility-administered medications prior to visit.    PAST MEDICAL HISTORY: Past Medical History  Diagnosis Date  . Seizures   . Anxiety   . Heart murmur     PAST SURGICAL HISTORY: History reviewed. No pertinent past surgical history.  FAMILY HISTORY: Family History  Problem Relation Age of Onset  . Healthy Mother   .  Healthy Father     SOCIAL HISTORY: History   Social History  . Marital Status: Single    Spouse Name: N/A    Number of Children: 0  . Years of Education: college   Occupational History  .  [employer]   Social History Main Topics  . Smoking status: Never Smoker   . Smokeless tobacco: Never Used  . Alcohol Use: No  . Drug Use: No  . Sexual Activity: Not on file   Other Topics Concern  . Not on file   Social History Narrative   Patient lives at home with her boyfriend. Patient works full time at [employer].   Education- College   Right handed.   Caffeine- coffee four times a week.     PHYSICAL EXAM  Filed Vitals:   06/25/13 0820  BP: 102/67  Pulse: 77  Height: 5' 7.5" (1.715 m)  Weight: 165 lb (74.844 kg)   Body mass index is 25.45 kg/(m^2).  Generalized: Well developed, in no acute distress    Neurological examination   Mentation: Alert oriented to time, place, history taking. Follows all commands speech and language fluent  Cranial nerve II-XII: Pupils were equal round reactive to light extraocular movements were full, visual field were full on confrontational test. Facial sensation and strength were normal. hearing was intact to finger rubbing bilaterally. Uvula tongue midline. head turning and shoulder shrug and were normal and symmetric.Tongue protrusion into  cheek strength was normal. Motor: normal bulk and tone, full strength in the BUE, BLE, fine finger movements normal,  No focal weakness Coordination: finger-nose-finger, heel-to-shin bilaterally, no dysmetria Reflexes: 1+ upper lower and symmetric Gait and Station: Rising up from seated position without assistance, normal stance,  moderate stride, good arm swing, smooth turning, able to perform tiptoe, and heel walking without difficulty. Tandem steady  DIAGNOSTIC DATA (LABS, IMAGING, TESTING) - I reviewed patient records, labs, notes, testing and imaging myself where available.      Component  Value Date/Time   NA 137 12/10/2012 1122   K 4.2 12/10/2012 1122   CL 99 12/10/2012 1122   CO2 29 12/10/2012 1122   GLUCOSE 78 12/10/2012 1122   BUN 11 12/10/2012 1122   CREATININE 0.71 12/10/2012 1122   CREATININE 0.70 04/07/2011 0900   CALCIUM 9.7 12/10/2012 1122   GFRNONAA >60 04/07/2011 0900   GFRAA >60 04/07/2011 0900      Lab Results  Component Value Date   TSH 1.550 11/29/2012      ASSESSMENT AND PLAN  32 y.o. year old female  has a past medical history of Seizures here to followup. Currently on Vimpat and Lamictal with last seizure occurring 08/24/12.  Trough Lamictal level Continue Lamictal and Vimpat at current doses.  Will refill Klonopin, RX given to patient F/U in 6 months Anita [name], [facility], [facility], APRN  [facility] [address], [address] [place], [place] [address] [phone]

## 2013-07-02 ENCOUNTER — Ambulatory Visit: Payer: 59

## 2013-07-08 ENCOUNTER — Other Ambulatory Visit: Payer: Self-pay | Admitting: Nurse Practitioner

## 2013-07-11 NOTE — Telephone Encounter (Signed)
Pt's prescription was faxed over to [facility] at [phone].

## 2013-07-17 ENCOUNTER — Other Ambulatory Visit: Payer: Self-pay | Admitting: Family Medicine

## 2013-07-17 MED ORDER — CLINDAMYCIN HCL 300 MG PO CAPS: 300.0000 mg | ORAL_CAPSULE | Freq: Three times a day (TID) | ORAL | Status: DC

## 2013-07-24 ENCOUNTER — Telehealth: Payer: Self-pay | Admitting: Nurse Practitioner

## 2013-07-24 NOTE — Telephone Encounter (Signed)
Called for more clarification, no answer, lt VM message for call back

## 2013-07-24 NOTE — Telephone Encounter (Signed)
TC to pt. Left message last RX on 06/25/13 for 45 pills which is 1 daily and prn. She has refills on that on

## 2013-11-18 ENCOUNTER — Other Ambulatory Visit: Payer: Self-pay | Admitting: Family Medicine

## 2013-11-18 MED ORDER — CITALOPRAM HYDROBROMIDE 20 MG PO TABS: 20.0000 mg | ORAL_TABLET | Freq: Every day | ORAL | Status: DC

## 2013-11-18 NOTE — Progress Notes (Signed)
Increased depression and anxiety---situational issues. Home stressors. No SI/HI but anxious and tearful. Anxiety worst when coming out into pulblic. Similar problems about 3 years ago. Was briefly on meds (SSRI and benzo).I think she needs different PCP for these type issues. We discussed at length. Seems like anxious and depressive sx that are situational so I will give her one month SSRI until she gets with her new PCP. She is in agreement with this. She will let me know if things change before she is able to get appt w new PCP. Reiterated no alcohol, continue her sz meds.

## 2013-12-11 ENCOUNTER — Other Ambulatory Visit: Payer: Self-pay | Admitting: Neurology

## 2013-12-24 ENCOUNTER — Encounter (INDEPENDENT_AMBULATORY_CARE_PROVIDER_SITE_OTHER): Payer: Self-pay

## 2013-12-24 ENCOUNTER — Other Ambulatory Visit (INDEPENDENT_AMBULATORY_CARE_PROVIDER_SITE_OTHER): Payer: Self-pay

## 2013-12-24 ENCOUNTER — Ambulatory Visit (INDEPENDENT_AMBULATORY_CARE_PROVIDER_SITE_OTHER): Payer: 59 | Admitting: Nurse Practitioner

## 2013-12-24 ENCOUNTER — Encounter: Payer: Self-pay | Admitting: Nurse Practitioner

## 2013-12-24 VITALS — BP 108/68 | HR 86 | Ht 67.0 in | Wt 167.0 lb

## 2013-12-24 DIAGNOSIS — Z79899 Other long term (current) drug therapy: Secondary | ICD-10-CM

## 2013-12-24 DIAGNOSIS — Z0289 Encounter for other administrative examinations: Secondary | ICD-10-CM

## 2013-12-24 DIAGNOSIS — G40309 Generalized idiopathic epilepsy and epileptic syndromes, not intractable, without status epilepticus: Secondary | ICD-10-CM

## 2013-12-24 MED ORDER — CLONAZEPAM 0.5 MG PO TABS: 0.5000 mg | ORAL_TABLET | Freq: Every evening | ORAL | Status: DC | PRN

## 2013-12-24 NOTE — Patient Instructions (Signed)
Lamictal level already drawn today Continue Lamictal and Vimpat at current dose Continue Klonopin will refill Followup yearly when necessary Call for any seizure activity

## 2013-12-24 NOTE — Progress Notes (Signed)
[facility]  PATIENT: JAVIER MAMONE DOB: [birth year]   REASON FOR VISIT: Followup for seizure disorder   HISTORY OF PRESENT ILLNESS: [name], 33 year old female returns for followup. She has a history of generalized seizure disorder with last seizure occurring December 2013. She is currently on Vimpat at 200 mg at bedtime and Lamictal 200 mg in the morning and 300 at night. She got trough labs drawn  this morning prior to visit. She continues to work full time. She is driving. She has had no new neurologic complaints    HISTORY:  generalized seizure disorder. Date of last seizure 08/24/2012 when she hit her head on the table after having a seizure. CT of the head was normal. She denies further staring spells, confusion, sleep disturbances, lapses of time, headache and bowel and bladder incontinence. She continues to work full-time at [employer] . She did not get her Lamictal level after last visit. She is not driving.   REVIEW OF SYSTEMS: Full 14 system review of systems performed and notable only for those listed, all others are neg:  Constitutional: N/A  Cardiovascular: N/A  Ear/Nose/Throat: N/A  Skin: N/A  Eyes: N/A  Respiratory: N/A  Gastroitestinal: N/A  Hematology/Lymphatic: N/A  Endocrine: N/A Musculoskeletal:N/A  Allergy/Immunology: N/A  Neurological: N/A Psychiatric: N/A   ALLERGIES: Allergies  Allergen Reactions  . Dye Fdc Red [Dye Fdc Red 3 (Erythrosine)]   . Flagyl [Metronidazole Hcl]     GI upset  . Morphine And Related     vomitting    HOME MEDICATIONS: Outpatient Prescriptions Prior to Visit  Medication Sig Dispense Refill  . VIMPAT 200 MG TABS tablet TAKE 1 TABLET BY MOUTH AT BEDTIME  30 tablet  PRN  . clonazePAM (KLONOPIN) 0.5 MG tablet Take 1 tablet (0.5 mg total) by mouth at bedtime as needed for anxiety.  45 tablet  3  . LAMICTAL 200 MG tablet Take 1.5 tablets (300 mg total) by mouth 2 (two) times daily.  90 tablet  0    . citalopram (CELEXA) 20 MG tablet Take 1 tablet (20 mg total) by mouth daily.  30 tablet  0   No facility-administered medications prior to visit.    PAST MEDICAL HISTORY: Past Medical History  Diagnosis Date  . Seizures   . Anxiety   . Heart murmur     PAST SURGICAL HISTORY: History reviewed. No pertinent past surgical history.  FAMILY HISTORY: Family History  Problem Relation Age of Onset  . Healthy Mother   . Healthy Father     SOCIAL HISTORY: History   Social History  . Marital Status: Single    Spouse Name: N/A    Number of Children: 0  . Years of Education: college   Occupational History  .  [employer]   Social History Main Topics  . Smoking status: Never Smoker   . Smokeless tobacco: Never Used  . Alcohol Use: No  . Drug Use: No  . Sexual Activity: Not on file   Other Topics Concern  . Not on file   Social History Narrative   Patient lives at home with her boyfriend. Patient works full time at [employer].   Education- College   Right handed.   Caffeine- coffee four times a week.     PHYSICAL EXAM  Filed Vitals:   12/24/13 0858  BP: 108/68  Pulse: 86  Height: 5\' 7"  (1.702 m)  Weight: 167 lb (75.751 kg)   Body mass index is  26.15 kg/(m^2).  Generalized: Well developed, in no acute distress  Head: normocephalic and atraumatic,. Oropharynx benign  Musculoskeletal: No deformity   Neurological examination   Mentation: Alert oriented to time, place, history taking. Follows all commands speech and language fluent  Cranial nerve II-XII: Pupils were equal round reactive to light extraocular movements were full, visual field were full on confrontational test. Facial sensation and strength were normal. hearing was intact to finger rubbing bilaterally. Uvula tongue midline. head turning and shoulder shrug were normal and symmetric.Tongue protrusion into cheek strength was normal. Motor: normal bulk and tone, full strength in the BUE, BLE, fine  finger movements normal, no pronator drift. No focal weakness Coordination: finger-nose-finger, heel-to-shin bilaterally, no dysmetria Reflexes: 1+ upper lower and symmetric, plantar responses were flexor bilaterally. Gait and Station: Rising up from seated position without assistance, normal stance,  moderate stride, good arm swing, smooth turning, able to perform tiptoe, and heel walking without difficulty. Tandem gait is steady  DIAGNOSTIC DATA (LABS, IMAGING, TESTING) - ASSESSMENT AND PLAN  33 y.o. year old female  has a past medical history of Seizures; and insomnia here to followup. Last seizure occurred 08/24/2012  Lamictal level already drawn today Continue Lamictal and Vimpat at current dose Continue Klonopin will refill Followup yearly when necessary Call for any seizure activity [name], [facility], [facility], [facility] [address], [place], [place] [address] [phone]

## 2013-12-26 LAB — LAMOTRIGINE LEVEL: LAMOTRIGINE LVL: 7.2 ug/mL (ref 2.0–20.0)

## 2013-12-26 NOTE — Progress Notes (Signed)
Quick Note:  Shared labs with patient per Ms Martin's findings, thru VM message ______

## 2014-01-16 ENCOUNTER — Other Ambulatory Visit: Payer: Self-pay

## 2014-01-16 MED ORDER — LAMICTAL 200 MG PO TABS: ORAL_TABLET | ORAL | Status: DC

## 2014-01-16 MED ORDER — LAMOTRIGINE 200 MG PO TABS: ORAL_TABLET | ORAL | Status: DC

## 2014-02-05 ENCOUNTER — Other Ambulatory Visit: Payer: Self-pay | Admitting: Neurology

## 2014-02-07 NOTE — Telephone Encounter (Signed)
Pt's Rx was faxed to the pharmacy.

## 2014-05-20 ENCOUNTER — Ambulatory Visit (HOSPITAL_COMMUNITY)
Admission: RE | Admit: 2014-05-20 | Discharge: 2014-05-20 | Disposition: A | Discharge status: Home / Self Care | Payer: 59 | Source: Ambulatory Visit | Attending: Family Medicine | Admitting: Family Medicine

## 2014-05-20 ENCOUNTER — Ambulatory Visit (INDEPENDENT_AMBULATORY_CARE_PROVIDER_SITE_OTHER): Payer: 59 | Admitting: Family Medicine

## 2014-05-20 DIAGNOSIS — S99921A Unspecified injury of right foot, initial encounter: Secondary | ICD-10-CM

## 2014-05-20 DIAGNOSIS — M79609 Pain in unspecified limb: Secondary | ICD-10-CM | POA: Diagnosis not present

## 2014-05-20 DIAGNOSIS — S8990XA Unspecified injury of unspecified lower leg, initial encounter: Secondary | ICD-10-CM | POA: Insufficient documentation

## 2014-05-20 DIAGNOSIS — S99929A Unspecified injury of unspecified foot, initial encounter: Secondary | ICD-10-CM

## 2014-05-20 DIAGNOSIS — S99919A Unspecified injury of unspecified ankle, initial encounter: Secondary | ICD-10-CM | POA: Insufficient documentation

## 2014-05-20 DIAGNOSIS — X58XXXA Exposure to other specified factors, initial encounter: Secondary | ICD-10-CM | POA: Diagnosis not present

## 2014-05-20 MED ORDER — KETOROLAC TROMETHAMINE 30 MG/ML IM SOLN: 30.0000 mg | Freq: Once | INTRAMUSCULAR | Status: DC

## 2014-05-20 MED ORDER — KETOROLAC TROMETHAMINE 30 MG/ML IJ SOLN
30.0000 mg | Freq: Once | INTRAMUSCULAR | Status: AC
  Administered 2014-05-20: 30 mg via INTRAMUSCULAR

## 2014-05-20 NOTE — Addendum Note (Signed)
Addended by: Cordelia Poche A on: 05/20/2014 03:30 PM   Modules accepted: Orders

## 2014-05-20 NOTE — Progress Notes (Signed)
    Subjective   POLA FURNO is a 33 y.o. female that presents for a same day visit  1. Right foot pain: Patient presents with right foot pain that started 11 days ago after assumed trauma. She states she could have injured while she was trekking through [employer] or after jumping over a fence. The pain has made it difficult for her to walk. She has used naproxen, ibuprofen and Tylenol daily for pain, while recently starting [employer], which has helped. She has iced and elevated the foot as well. She is currently worried about a possible fracture since the foot pain is not improving.   History  Substance Use Topics  . Smoking status: Never Smoker   . Smokeless tobacco: Never Used  . Alcohol Use: No    ROS Per HPI  Objective   There were no vitals taken for this visit.  General: Well appearing female in no acute distress Musculoskeletal: Tenderness along right 4th and 5th metatarsals. 2+ DP. Normal ROM of right ankle compared to left. No obvious deformity. Gait assessed and patient walks with a limp due to pain.  Assessment and Plan   Please refer to problem based charting of assessment and plan

## 2014-05-20 NOTE — Addendum Note (Signed)
Addended by: [place] Pink on: 05/20/2014 04:59 PM   Modules accepted: Orders

## 2014-05-20 NOTE — Assessment & Plan Note (Addendum)
Possible sprain vs fracture. Will obtain x-ray. Concern for excessive NSAID use. Counseled on decreasing use. Will obtain BMET and prescribe diclofenac 75mg  BID. Patient instructed to stop OTC NSAID use while taking this medication. Will follow-up x-ray. Recommended Rest, Ice, Compression and Elevation.  Addendum: Toradol 30mg  IM while in clinic. Patient to start analgesic therapy tomorrow.

## 2014-05-21 LAB — BASIC METABOLIC PANEL
BUN: 10 mg/dL (ref 6–23)
CHLORIDE: 103 meq/L (ref 96–112)
CO2: 27 meq/L (ref 19–32)
Calcium: 9.7 mg/dL (ref 8.4–10.5)
Creat: 0.67 mg/dL (ref 0.50–1.10)
Glucose, Bld: 80 mg/dL (ref 70–99)
POTASSIUM: 4 meq/L (ref 3.5–5.3)
SODIUM: 137 meq/L (ref 135–145)

## 2014-05-22 ENCOUNTER — Other Ambulatory Visit: Payer: Self-pay | Admitting: Family Medicine

## 2014-05-22 MED ORDER — DICLOFENAC SODIUM 75 MG PO TBEC: 75.0000 mg | DELAYED_RELEASE_TABLET | Freq: Two times a day (BID) | ORAL | Status: DC

## 2014-06-23 ENCOUNTER — Encounter (HOSPITAL_COMMUNITY): Payer: Self-pay | Admitting: Emergency Medicine

## 2014-06-23 ENCOUNTER — Other Ambulatory Visit (HOSPITAL_COMMUNITY)
Admission: RE | Admit: 2014-06-23 | Discharge: 2014-06-23 | Disposition: A | Discharge status: Home / Self Care | Payer: 59 | Source: Ambulatory Visit | Attending: Family Medicine | Admitting: Family Medicine

## 2014-06-23 ENCOUNTER — Emergency Department (HOSPITAL_COMMUNITY)
Admission: EM | Admit: 2014-06-23 | Discharge: 2014-06-23 | Disposition: A | Discharge status: Home / Self Care | Payer: 59 | Source: Home / Self Care | Attending: Family Medicine | Admitting: Family Medicine

## 2014-06-23 DIAGNOSIS — Z113 Encounter for screening for infections with a predominantly sexual mode of transmission: Secondary | ICD-10-CM | POA: Diagnosis present

## 2014-06-23 DIAGNOSIS — N760 Acute vaginitis: Secondary | ICD-10-CM | POA: Diagnosis present

## 2014-06-23 NOTE — ED Provider Notes (Signed)
CSN: [id number]     Arrival date & time 06/23/14  1824 History   First MD Initiated Contact with Patient 06/23/14 1856     Chief Complaint  Patient presents with  . Vaginal Discharge   (Consider location/radiation/quality/duration/timing/severity/associated sxs/prior Treatment) Patient is a 33 y.o. female presenting with vaginal discharge. The history is provided by the patient.  Vaginal Discharge Quality:  White and watery Severity:  Mild Onset quality:  Gradual Duration:  3 weeks Chronicity:  New Associated symptoms: dyspareunia   Associated symptoms: no dysuria, no fever, no urinary frequency, no urinary hesitancy, no urinary incontinence and no vaginal itching   Risk factors: no new sexual partner and no STI exposure   Risk factors comment:  H/o ovarian cysts.   Past Medical History  Diagnosis Date  . Seizures   . Anxiety   . Heart murmur    History reviewed. No pertinent past surgical history. Family History  Problem Relation Age of Onset  . Healthy Mother   . Healthy Father    History  Substance Use Topics  . Smoking status: Never Smoker   . Smokeless tobacco: Never Used  . Alcohol Use: No   OB History   Grav Para Term Preterm Abortions TAB SAB Ect Mult Living                 Review of Systems  Constitutional: Negative.  Negative for fever.  Genitourinary: Positive for vaginal discharge, pelvic pain and dyspareunia. Negative for bladder incontinence, dysuria, hesitancy, frequency, flank pain, vaginal bleeding and menstrual problem.    Allergies  Dye fdc red; Flagyl; and Morphine and related  Home Medications   Prior to Admission medications   Medication Sig Start Date End Date Taking? Authorizing Provider  clonazePAM (KLONOPIN) 0.5 MG tablet Take 1 tablet (0.5 mg total) by mouth at bedtime as needed (take 1 tab at bedtime as needed for insomnia and repeat as neccessary #45). 12/24/13  Yes [name], NP  LAMICTAL 200 MG tablet 1.5 tabs in the  am 1 tab at hs 01/16/14  Yes Marcial Pacas, MD  VIMPAT 200 MG TABS tablet TAKE 1 TABLET BY MOUTH AT BEDTIME 02/05/14  Yes Marcial Pacas, MD  diclofenac (VOLTAREN) 75 MG EC tablet Take 1 tablet (75 mg total) by mouth 2 (two) times daily. 05/22/14   Cordelia Poche, MD  ketorolac (TORADOL) 30 MG/ML injection Inject 1 mL (30 mg total) into the muscle once. 05/20/14   Cordelia Poche, MD   BP 112/79  Pulse 74  Temp(Src) 97.9 F (36.6 C) (Oral)  Resp 14  SpO2 100%  LMP 06/14/2014 Physical Exam  Nursing note and vitals reviewed. Constitutional: She is oriented to person, place, and time. She appears well-developed and well-nourished.  Neck: Normal range of motion. Neck supple.  Abdominal: Soft. Bowel sounds are normal.  Genitourinary: Uterus normal. Uterus is not tender. Cervix exhibits no motion tenderness. Right adnexum displays tenderness and fullness. Right adnexum displays no mass. Left adnexum displays tenderness and fullness. Left adnexum displays no mass. No vaginal discharge found.    Endocervical polyp, no d/c.or bleeding.  Lymphadenopathy:    She has no cervical adenopathy.  Neurological: She is alert and oriented to person, place, and time.  Skin: Skin is warm and dry.    ED Course  Procedures (including critical care time) Labs Review Labs Reviewed - No data to display  Imaging Review No results found.   MDM   1. Vaginitis  Billy Fischer, MD 06/23/14 [phone]

## 2014-06-23 NOTE — ED Notes (Signed)
Pt states that she has had a discharge for 3 weeks. Pt states no pain at this time and pt is in no acute distress at this time.

## 2014-06-23 NOTE — Discharge Instructions (Signed)
See your doctor for pelvic u/s and bx of cervical polyp and check of test results.

## 2014-06-24 LAB — CERVICOVAGINAL ANCILLARY ONLY
Chlamydia: NEGATIVE
NEISSERIA GONORRHEA: NEGATIVE

## 2014-06-25 LAB — CERVICOVAGINAL ANCILLARY ONLY
WET PREP (BD AFFIRM): NEGATIVE
WET PREP (BD AFFIRM): NEGATIVE
WET PREP (BD AFFIRM): NEGATIVE

## 2014-07-07 ENCOUNTER — Other Ambulatory Visit: Payer: Self-pay

## 2014-07-07 MED ORDER — CLONAZEPAM 0.5 MG PO TABS: 0.5000 mg | ORAL_TABLET | Freq: Every evening | ORAL | Status: DC | PRN

## 2014-07-10 ENCOUNTER — Ambulatory Visit (INDEPENDENT_AMBULATORY_CARE_PROVIDER_SITE_OTHER): Payer: 59 | Admitting: Family Medicine

## 2014-07-10 VITALS — BP 119/72 | HR 92 | Temp 98.5°F | Ht 67.0 in | Wt 163.1 lb

## 2014-07-10 DIAGNOSIS — Z3041 Encounter for surveillance of contraceptive pills: Secondary | ICD-10-CM

## 2014-07-10 DIAGNOSIS — N841 Polyp of cervix uteri: Secondary | ICD-10-CM

## 2014-07-10 MED ORDER — NORGESTIMATE-ETH ESTRADIOL 0.25-35 MG-MCG PO TABS: 1.0000 | ORAL_TABLET | Freq: Every day | ORAL | Status: DC

## 2014-07-11 DIAGNOSIS — Z309 Encounter for contraceptive management, unspecified: Secondary | ICD-10-CM | POA: Insufficient documentation

## 2014-07-11 DIAGNOSIS — Z0000 Encounter for general adult medical examination without abnormal findings: Secondary | ICD-10-CM | POA: Insufficient documentation

## 2014-07-11 NOTE — Progress Notes (Signed)
Patient ID: Anita [name], female   DOB: [birth year], 33 y.o.   MRN: [id number] Patient here for further evaluation and management of a polyp noted at her cervical os during a visit to urgent care for vaginal discharge.has been having a lot of discharge and has been treated with doxycycline which initially seemed to make it better but then it returned. Discharge is thick, clear or light in color. Her menses are regular. She's not currently using any birth control although she is section active with men. Has not had any unusual spotting or unusual vaginal bleeding.  PERTINENT  PMH / PSH: I have reviewed the patient's medications, allergies, past medical and surgical history. Pertinent findings that relate to today's visit / issues include: Polycystic ovarian syndrome, seizure disorder on chronic antiepileptics. OBJECTIVE:vital signs are reviewed GU: Externally normal female. No adnexal masses or tenderness are noted. Uterus appears normal in size and position. Speculum exam reveals a small typical appearing polyp at the cervical os. PROCEDURE NOTE: prior to the examination, thePatient given informed consent for removal of cervical polyp should it be Determined that in fact that's what the lesion was. Signed copy of the consent form is in the chart. After the os was exposed with speculum, Procrit time out was taken within the period typical appearing polyp was grasped with ring forceps and it easily separated from the os. Polyp was about 3-4 mm in diameter. There were no abnormalities of the polyp that made me concerned for malignancy and we did not send this for pathology. There was essentially no bleeding from loss. Patient tolerated procedure well. All equipment was removed. Post procedure instructions were given. ASSESSMENT: Cervical polyp, removed #2. Sexually active female without any current contraception and with history of PCO S complaining of recurrent vaginal process think is most likely cervical mucus.  We discussed at length. I would recommend she goes back on the birth control pills or some other type of contraception and she agrees. It may also decrease her problems with discharge. Her prescription and will be called and she'll follow-up with me in 2 months, sooner with problems.

## 2014-07-20 ENCOUNTER — Emergency Department (HOSPITAL_COMMUNITY)
Admission: EM | Admit: 2014-07-20 | Discharge: 2014-07-20 | Disposition: A | Discharge status: Home / Self Care | Payer: 59 | Source: Home / Self Care | Attending: Family Medicine | Admitting: Family Medicine

## 2014-07-20 ENCOUNTER — Encounter (HOSPITAL_COMMUNITY): Payer: Self-pay

## 2014-07-20 DIAGNOSIS — J040 Acute laryngitis: Secondary | ICD-10-CM

## 2014-07-20 DIAGNOSIS — J029 Acute pharyngitis, unspecified: Secondary | ICD-10-CM

## 2014-07-20 DIAGNOSIS — J069 Acute upper respiratory infection, unspecified: Secondary | ICD-10-CM

## 2014-07-20 LAB — POCT RAPID STREP A: Streptococcus, Group A Screen (Direct): NEGATIVE

## 2014-07-20 MED ORDER — IPRATROPIUM BROMIDE 0.02 % IN SOLN: 0.5000 mg | Freq: Once | RESPIRATORY_TRACT | Status: DC

## 2014-07-20 MED ORDER — IPRATROPIUM-ALBUTEROL 0.5-2.5 (3) MG/3ML IN SOLN
3.0000 mL | Freq: Once | RESPIRATORY_TRACT | Status: AC
  Administered 2014-07-20: 3 mL via RESPIRATORY_TRACT

## 2014-07-20 MED ORDER — IPRATROPIUM BROMIDE 0.06 % NA SOLN: 2.0000 | NASAL | Status: DC | PRN

## 2014-07-20 MED ORDER — ALBUTEROL SULFATE (2.5 MG/3ML) 0.083% IN NEBU: 5.0000 mg | INHALATION_SOLUTION | Freq: Once | RESPIRATORY_TRACT | Status: DC

## 2014-07-20 MED ORDER — IPRATROPIUM-ALBUTEROL 0.5-2.5 (3) MG/3ML IN SOLN
RESPIRATORY_TRACT | Status: AC
  Filled 2014-07-20: qty 3

## 2014-07-20 NOTE — Discharge Instructions (Signed)
Laryngitis Laryngitis is redness, soreness, and puffiness (inflammation) of the vocal cords. It causes hoarseness, cough, loss of voice, sore throat, and dry throat. It may be caused by:  Infection.  Too much smoking.  Too much talking or yelling.  Breathing in of toxic fumes.  Allergies.  A backup of acid from your stomach. HOME CARE  Drink enough fluids to keep your pee (urine) clear or pale yellow.  Rest until you no longer have problems or as told by your doctor.  Breathe in moist air.  Take all medicine as told by your doctor.  Do not smoke.  Talk as little as possible (this includes whispering).  Write on paper instead of talking until your voice is back to normal.  Follow up with your doctor if you have not improved after 10 days. GET HELP IF:   You have trouble breathing.  You cough up blood.  You have a fever that will not go away.  You have increasing pain.  You have trouble swallowing. MAKE SURE YOU:  Understand these instructions.  Will watch your condition.  Will get help right away if you are not doing well or get worse. Document Released: 08/04/2011 Document Revised: 11/07/2011 Document Reviewed: 08/04/2011 [facility] Patient Information 2015 [place], [place]. This information is not intended to replace advice given to you by your health care provider. Make sure you discuss any questions you have with your health care provider.  Pharyngitis Pharyngitis is a sore throat (pharynx). There is redness, pain, and swelling of your throat. HOME CARE   Drink enough fluids to keep your pee (urine) clear or pale yellow.  Only take medicine as told by your doctor.  You may get sick again if you do not take medicine as told. Finish your medicines, even if you start to feel better.  Do not take aspirin.  Rest.  Rinse your mouth (gargle) with salt water ( tsp of salt per 1 qt of water) every 1-2 hours. This will help the pain.  If you are not at risk  for choking, you can suck on hard candy or sore throat lozenges. GET HELP IF:  You have large, tender lumps on your neck.  You have a rash.  You cough up green, yellow-brown, or bloody spit. GET HELP RIGHT AWAY IF:   You have a stiff neck.  You drool or cannot swallow liquids.  You throw up (vomit) or are not able to keep medicine or liquids down.  You have very bad pain that does not go away with medicine.  You have problems breathing (not from a stuffy nose). MAKE SURE YOU:   Understand these instructions.  Will watch your condition.  Will get help right away if you are not doing well or get worse. Document Released: 02/01/2008 Document Revised: 06/05/2013 Document Reviewed: 04/22/2013 [facility] Patient Information 2015 [place], [place]. This information is not intended to replace advice given to you by your health care provider. Make sure you discuss any questions you have with your health care provider.  Upper Respiratory Infection, Adult An upper respiratory infection (URI) is also sometimes known as the common cold. The upper respiratory tract includes the nose, sinuses, throat, trachea, and bronchi. Bronchi are the airways leading to the lungs. Most people improve within 1 week, but symptoms can last up to 2 weeks. A residual cough may last even longer.  CAUSES Many different viruses can infect the tissues lining the upper respiratory tract. The tissues become irritated and inflamed and often  become very moist. Mucus production is also common. A cold is contagious. You can easily spread the virus to others by oral contact. This includes kissing, sharing a glass, coughing, or sneezing. Touching your mouth or nose and then touching a surface, which is then touched by another person, can also spread the virus. SYMPTOMS  Symptoms typically develop 1 to 3 days after you come in contact with a cold virus. Symptoms vary from person to person. They may include:  Runny  nose.  Sneezing.  Nasal congestion.  Sinus irritation.  Sore throat.  Loss of voice (laryngitis).  Cough.  Fatigue.  Muscle aches.  Loss of appetite.  Headache.  Low-grade fever. DIAGNOSIS  You might diagnose your own cold based on familiar symptoms, since most people get a cold 2 to 3 times a year. Your caregiver can confirm this based on your exam. Most importantly, your caregiver can check that your symptoms are not due to another disease such as strep throat, sinusitis, pneumonia, asthma, or epiglottitis. Blood tests, throat tests, and X-rays are not necessary to diagnose a common cold, but they may sometimes be helpful in excluding other more serious diseases. Your caregiver will decide if any further tests are required. RISKS AND COMPLICATIONS  You may be at risk for a more severe case of the common cold if you smoke cigarettes, have chronic heart disease (such as heart failure) or lung disease (such as asthma), or if you have a weakened immune system. The very young and very old are also at risk for more serious infections. Bacterial sinusitis, middle ear infections, and bacterial pneumonia can complicate the common cold. The common cold can worsen asthma and chronic obstructive pulmonary disease (COPD). Sometimes, these complications can require emergency medical care and may be life-threatening. PREVENTION  The best way to protect against getting a cold is to practice good hygiene. Avoid oral or hand contact with people with cold symptoms. Wash your hands often if contact occurs. There is no clear evidence that vitamin C, vitamin E, echinacea, or exercise reduces the chance of developing a cold. However, it is always recommended to get plenty of rest and practice good nutrition. TREATMENT  Treatment is directed at relieving symptoms. There is no cure. Antibiotics are not effective, because the infection is caused by a virus, not by bacteria. Treatment may include:  Increased  fluid intake. Sports drinks offer valuable electrolytes, sugars, and fluids.  Breathing heated mist or steam (vaporizer or shower).  Eating chicken soup or other clear broths, and maintaining good nutrition.  Getting plenty of rest.  Using gargles or lozenges for comfort.  Controlling fevers with ibuprofen or acetaminophen as directed by your caregiver.  Increasing usage of your inhaler if you have asthma. Zinc gel and zinc lozenges, taken in the first 24 hours of the common cold, can shorten the duration and lessen the severity of symptoms. Pain medicines may help with fever, muscle aches, and throat pain. A variety of non-prescription medicines are available to treat congestion and runny nose. Your caregiver can make recommendations and may suggest nasal or lung inhalers for other symptoms.  HOME CARE INSTRUCTIONS   Only take over-the-counter or prescription medicines for pain, discomfort, or fever as directed by your caregiver.  Use a warm mist humidifier or inhale steam from a shower to increase air moisture. This may keep secretions moist and make it easier to breathe.  Drink enough water and fluids to keep your urine clear or pale yellow.  Rest as  needed.  Return to work when your temperature has returned to normal or as your caregiver advises. You may need to stay home longer to avoid infecting others. You can also use a face mask and careful hand washing to prevent spread of the virus. SEEK MEDICAL CARE IF:   After the first few days, you feel you are getting worse rather than better.  You need your caregiver's advice about medicines to control symptoms.  You develop chills, worsening shortness of breath, or brown or red sputum. These may be signs of pneumonia.  You develop yellow or brown nasal discharge or pain in the face, especially when you bend forward. These may be signs of sinusitis.  You develop a fever, swollen neck glands, pain with swallowing, or white areas in  the back of your throat. These may be signs of strep throat. SEEK IMMEDIATE MEDICAL CARE IF:   You have a fever.  You develop severe or persistent headache, ear pain, sinus pain, or chest pain.  You develop wheezing, a prolonged cough, cough up blood, or have a change in your usual mucus (if you have chronic lung disease).  You develop sore muscles or a stiff neck. Document Released: 02/08/2001 Document Revised: 11/07/2011 Document Reviewed: 11/20/2013 [facility] Patient Information 2015 [place], [place]. This information is not intended to replace advice given to you by your health care provider. Make sure you discuss any questions you have with your health care provider.

## 2014-07-20 NOTE — ED Provider Notes (Signed)
CSN: [id number]     Arrival date & time 07/20/14  1220 History   First MD Initiated Contact with Patient 07/20/14 1331     Chief Complaint  Patient presents with  . Cough   (Consider location/radiation/quality/duration/timing/severity/associated sxs/prior Treatment) HPI           33 year old female presents for evaluation of several days of cough, losing her voice, and sore throat. This began after she was exposed to someone at work who had similar illness. She decided to go ahead and come in to be seen today because she is experiencing a burning sensation in her chest. The cough is dry, nonproductive. She denies fever, chills, NVD, chest pain, or shortness of breath. No recent travel. Over-the-counter medications are not helping.  Past Medical History  Diagnosis Date  . Seizures   . Anxiety   . Heart murmur    History reviewed. No pertinent past surgical history. Family History  Problem Relation Age of Onset  . Healthy Mother   . Healthy Father    History  Substance Use Topics  . Smoking status: Never Smoker   . Smokeless tobacco: Never Used  . Alcohol Use: No   OB History    No data available     Review of Systems  Constitutional: Negative for fever and chills.  HENT: Positive for congestion, rhinorrhea and sore throat. Negative for sinus pressure.   Respiratory: Positive for cough. Negative for chest tightness and wheezing.   All other systems reviewed and are negative.   Allergies  Dye fdc red; Flagyl; and Morphine and related  Home Medications   Prior to Admission medications   Medication Sig Start Date End Date Taking? Authorizing Provider  clonazePAM (KLONOPIN) 0.5 MG tablet Take 1 tablet (0.5 mg total) by mouth at bedtime as needed (take 1 tab at bedtime as needed for insomnia and repeat as neccessary #45). 07/07/14   Marcial Pacas, MD  ipratropium (ATROVENT) 0.06 % nasal spray Place 2 sprays into both nostrils every 4 (four) hours as needed for rhinitis. 07/20/14    Liam Graham, PA-C  LAMICTAL 200 MG tablet 1.5 tabs in the am 1 tab at hs 01/16/14   Marcial Pacas, MD  norgestimate-ethinyl estradiol (ORTHO-CYCLEN,SPRINTEC,PREVIFEM) 0.25-35 MG-MCG tablet Take 1 tablet by mouth daily. 07/10/14   Dickie La, MD  VIMPAT 200 MG TABS tablet TAKE 1 TABLET BY MOUTH AT BEDTIME 02/05/14   Marcial Pacas, MD   BP 120/88 mmHg  Pulse 86  Temp(Src) 98.2 F (36.8 C) (Oral)  Resp 16  SpO2 99%  LMP 07/10/2014 Physical Exam  Constitutional: She is oriented to person, place, and time. Vital signs are normal. She appears well-developed and well-nourished. No distress.  HENT:  Head: Normocephalic and atraumatic.  Cardiovascular: Normal rate, regular rhythm and normal heart sounds.   Pulmonary/Chest: Effort normal. No respiratory distress. She has wheezes (very slight).  Neurological: She is alert and oriented to person, place, and time. She has normal strength. Coordination normal.  Skin: Skin is warm and dry. No rash noted. She is not diaphoretic.  Psychiatric: She has a normal mood and affect. Judgment normal.  Nursing note and vitals reviewed.   ED Course  Procedures (including critical care time) Labs Review Labs Reviewed  POCT RAPID STREP A ([facility] CARE ONLY)    Imaging Review No results found.  Wheezing has resolved with 1 DuoNeb breathing treatment, although she cannot finish it because it caused a severe headache  MDM   1.  URI (upper respiratory infection)   2. Pharyngitis   3. Laryngitis    Vital signs are normal, she is afebrile and nontoxic. Most likely viral upper respiratory infection. Treat symptomatically. Atrovent nasal spray for the rhinorrhea. Follow-up when necessary  Meds ordered this encounter  Medications  . DISCONTD: albuterol (PROVENTIL) (2.5 MG/3ML) 0.083% nebulizer solution 5 mg    Sig:   . DISCONTD: ipratropium (ATROVENT) nebulizer solution 0.5 mg    Sig:   . ipratropium-albuterol (DUONEB) 0.5-2.5 (3) MG/3ML nebulizer solution 3  mL    Sig:   . ipratropium (ATROVENT) 0.06 % nasal spray    Sig: Place 2 sprays into both nostrils every 4 (four) hours as needed for rhinitis.    Dispense:  15 mL    Refill:  1    Order Specific Question:  Supervising Provider    Answer:  Lynne Leader, [place]       Liam Graham, PA-C 07/20/14 1348

## 2014-07-20 NOTE — ED Notes (Signed)
C/o cough x several days, getting worse, lost her voice, ST

## 2014-07-22 LAB — CULTURE, GROUP A STREP

## 2014-08-04 ENCOUNTER — Other Ambulatory Visit: Payer: Self-pay | Admitting: Neurology

## 2014-08-13 ENCOUNTER — Telehealth: Payer: Self-pay | Admitting: Nurse Practitioner

## 2014-08-13 NOTE — Telephone Encounter (Signed)
Patient stated she trying to get pregnant and questioning what medications does she need to discontinue.  Please call and advise.

## 2014-08-14 NOTE — Telephone Encounter (Signed)
TC to patient in looking back at her record in [place] she has been on multiple medications and monotherapy has not controlled seizure activity. Last seizure in 2013. Advised to take folic acid and when she gets pregnant she is considered high risk. Will need to check levels every 3 to 4 months.She verbalizes understanding

## 2014-08-28 ENCOUNTER — Other Ambulatory Visit: Payer: Self-pay | Admitting: Neurology

## 2014-08-28 NOTE — Telephone Encounter (Signed)
Dr Krista Blue is out of the office.  Forwarding request to [facility] for approval

## 2014-08-28 NOTE — Telephone Encounter (Signed)
Rx signed and faxed.

## 2014-09-24 ENCOUNTER — Other Ambulatory Visit: Payer: Self-pay | Admitting: Obstetrics and Gynecology

## 2014-09-25 LAB — CYTOLOGY - PAP

## 2014-09-29 ENCOUNTER — Other Ambulatory Visit (HOSPITAL_COMMUNITY): Payer: Self-pay | Admitting: Obstetrics and Gynecology

## 2014-09-29 DIAGNOSIS — N809 Endometriosis, unspecified: Secondary | ICD-10-CM

## 2014-10-06 ENCOUNTER — Ambulatory Visit (HOSPITAL_COMMUNITY)
Admission: RE | Admit: 2014-10-06 | Discharge: 2014-10-06 | Disposition: A | Discharge status: Home / Self Care | Payer: 59 | Source: Ambulatory Visit | Attending: Obstetrics and Gynecology | Admitting: Obstetrics and Gynecology

## 2014-10-06 DIAGNOSIS — N803 Endometriosis of pelvic peritoneum: Secondary | ICD-10-CM | POA: Diagnosis not present

## 2014-10-06 DIAGNOSIS — N809 Endometriosis, unspecified: Secondary | ICD-10-CM

## 2014-10-06 DIAGNOSIS — N979 Female infertility, unspecified: Secondary | ICD-10-CM | POA: Insufficient documentation

## 2014-10-06 MED ORDER — IOHEXOL 300 MG/ML  SOLN
20.0000 mL | Freq: Once | INTRAMUSCULAR | Status: AC | PRN
  Administered 2014-10-06: 20 mL

## 2014-10-08 ENCOUNTER — Ambulatory Visit: Payer: 59 | Admitting: Family Medicine

## 2014-12-18 ENCOUNTER — Telehealth: Payer: Self-pay | Admitting: *Deleted

## 2014-12-18 DIAGNOSIS — Z0289 Encounter for other administrative examinations: Secondary | ICD-10-CM

## 2014-12-18 NOTE — Telephone Encounter (Signed)
Patient form on [employer].

## 2014-12-24 ENCOUNTER — Other Ambulatory Visit: Payer: Self-pay | Admitting: Neurology

## 2014-12-30 ENCOUNTER — Telehealth: Payer: Self-pay | Admitting: *Deleted

## 2014-12-30 NOTE — Telephone Encounter (Signed)
Patient DMV form faxed to [place] [employer] on 12/30/14.

## 2015-01-20 ENCOUNTER — Other Ambulatory Visit: Payer: Self-pay | Admitting: Neurology

## 2015-01-20 ENCOUNTER — Telehealth: Payer: Self-pay | Admitting: Neurology

## 2015-01-20 MED ORDER — LAMICTAL 200 MG PO TABS: ORAL_TABLET | ORAL | Status: DC

## 2015-01-20 NOTE — Telephone Encounter (Signed)
This is a patient of [facility].  She has scheduled her annual appt.  I have re-sent the Rx to last until seen.  I called back to advise.  Got no answer.  Left message.   (Sorry Faith, unsure why this was sent to you as well, this is not your patient.)

## 2015-01-20 NOTE — Telephone Encounter (Signed)
Patient called stating the LAMICTAL 200 MG tablet was called in for 12 day supply. She would like 30 day supply. She is not picking up the 12 day supply so not to have to pay 2 copays. Please call and advise. Patient can be reached at [phone].

## 2015-01-22 ENCOUNTER — Telehealth: Payer: Self-pay | Admitting: *Deleted

## 2015-01-22 ENCOUNTER — Other Ambulatory Visit: Payer: Self-pay | Admitting: Neurology

## 2015-01-22 NOTE — Telephone Encounter (Signed)
Clonazepam rx faxed and confirmed to [place] at [phone].

## 2015-01-23 ENCOUNTER — Telehealth: Payer: Self-pay | Admitting: Neurology

## 2015-01-23 ENCOUNTER — Telehealth: Payer: Self-pay

## 2015-01-23 NOTE — Telephone Encounter (Signed)
Patient called stating she had a seizure yesterday 01/22/15 wanting to speak with Dr. Please call and advise. Patient can be reached at [phone].

## 2015-01-23 NOTE — Telephone Encounter (Signed)
Patient states she takes Klonopin PRN for sleep, but lately has been taking nightly.  She says that when she tries to go without it she has a seizure.  She is afraid of addiction and doesn't want to take nightly, can you please advise.

## 2015-01-27 NOTE — Telephone Encounter (Signed)
Separate enc sent to Dr. Krista Blue for review.  Anita [name] spoke to this patient.

## 2015-01-27 NOTE — Telephone Encounter (Signed)
Left message for a return call.  Need to offer her an earlier appt.  We have openings this week available.

## 2015-01-27 NOTE — Telephone Encounter (Signed)
Patient called/returning Michelle's call from 01/27/15. Patient states that she would like to just keep her appointment for 02/10/15 with Cecille Rubin, NP

## 2015-01-27 NOTE — Telephone Encounter (Signed)
Chart reviewed, patient was last seen by Hoyle Sauer in April 2015, is taking Vimpat 200 mg twice a day, lamotrigine 200/300 every day for her epilepsy,  She has follow-up with Hoyle Sauer again in June 2016, Sharyn Lull, may move up her appointment with me at my next available to discuss her medication choices.

## 2015-02-10 ENCOUNTER — Ambulatory Visit (INDEPENDENT_AMBULATORY_CARE_PROVIDER_SITE_OTHER): Payer: 59 | Admitting: Nurse Practitioner

## 2015-02-10 ENCOUNTER — Encounter: Payer: Self-pay | Admitting: Nurse Practitioner

## 2015-02-10 VITALS — BP 131/83 | HR 84 | Ht 67.0 in | Wt 177.8 lb

## 2015-02-10 DIAGNOSIS — Z5181 Encounter for therapeutic drug level monitoring: Secondary | ICD-10-CM

## 2015-02-10 DIAGNOSIS — G40309 Generalized idiopathic epilepsy and epileptic syndromes, not intractable, without status epilepticus: Secondary | ICD-10-CM

## 2015-02-10 DIAGNOSIS — R569 Unspecified convulsions: Secondary | ICD-10-CM

## 2015-02-10 MED ORDER — LACOSAMIDE 200 MG PO TABS: 200.0000 mg | ORAL_TABLET | Freq: Every day | ORAL | Status: DC

## 2015-02-10 MED ORDER — LAMICTAL 200 MG PO TABS: 300.0000 mg | ORAL_TABLET | Freq: Two times a day (BID) | ORAL | Status: DC

## 2015-02-10 NOTE — Progress Notes (Signed)
[facility]  PATIENT: Anita [name] DOB: 18-Sep-1980   REASON FOR VISIT:follow up for seizure disorder, chronic insomnia HISTORY FROM:patient    HISTORY OF PRESENT ILLNESS:Anita [name], 34 year old female returns for followup. She was last seen in this office 4/28/ 2015.  She has a history of generalized seizure disorder with last seizure occurring Jan 23, 2015 after stopping her Klonopin abruptly for 1 week. She is trying to get pregnant and wanted to be off of the drug. She takes Klonopin to help her sleep at night . She never feels rested in the mornings but she denies any daytime drowsiness. She is currently on Vimpat  200 mg at bedtime and Lamictal 300 mg in the morning and 300 at night. She increased her morning dose after her most recent seizure, she did not notify us.  She continues to work full time.  She has had no other new neurologic complaints.    HISTORY: generalized seizure disorder. Date of last seizure 08/24/2012 when she hit her head on the table after having a seizure. CT of the head was normal. She denies further staring spells, confusion, sleep disturbances, lapses of time, headache and bowel and bladder incontinence. She continues to work full-time at [employer] . She did not get her Lamictal level after last visit. She is not driving.    REVIEW OF SYSTEMS: Full 14 system review of systems performed and notable only for those listed, all others are neg:  Constitutional: neg  Cardiovascular: neg Ear/Nose/Throat: neg  Skin: neg Eyes: neg Respiratory: neg Gastroitestinal: neg  Hematology/Lymphatic: neg  Endocrine: neg Musculoskeletal:neg Allergy/Immunology: neg Neurological: Recent seizure after abruptly stopping Klonopin Psychiatric: neg Sleep : Chronic insomnia   ALLERGIES: Allergies  Allergen Reactions  . Dye Fdc Red [Dye Fdc Red 3 (Erythrosine)]   . Flagyl [Metronidazole Hcl]     GI upset  . Morphine And Related    vomitting    HOME MEDICATIONS: Outpatient Prescriptions Prior to Visit  Medication Sig Dispense Refill  . clonazePAM (KLONOPIN) 0.5 MG tablet TAKE 1 TABLET ONCE DAILY AT BEDTIME AS NEEDED FOR INSOMNIA, REPEAT AS NECCESSARY *MUST LAST 30 DAYS* 45 tablet 0  . LAMICTAL 200 MG tablet TAKE 1 TABLET BY MOUTH IN THE MORNING AND 1 & 1/2 TABLETS AT NIGHT (Patient taking differently: Take 300 mg by mouth 2 (two) times daily. TAKE 1 TABLET BY MOUTH IN THE MORNING AND 1 & 1/2 TABLETS AT NIGHT) 75 tablet 0  . VIMPAT 200 MG TABS tablet TAKE 1 TABLET BY MOUTH ONCE DAILY AT BEDTIME 30 tablet 5  . ipratropium (ATROVENT) 0.06 % nasal spray Place 2 sprays into both nostrils every 4 (four) hours as needed for rhinitis. (Patient not taking: Reported on 02/10/2015) 15 mL 1  . norgestimate-ethinyl estradiol (ORTHO-CYCLEN,SPRINTEC,PREVIFEM) 0.25-35 MG-MCG tablet Take 1 tablet by mouth daily. (Patient not taking: Reported on 02/10/2015) 3 Package 3   No facility-administered medications prior to visit.    PAST MEDICAL HISTORY: Past Medical History  Diagnosis Date  . Seizures   . Anxiety   . Heart murmur     PAST SURGICAL HISTORY: History reviewed. No pertinent past surgical history.  FAMILY HISTORY: Family History  Problem Relation Age of Onset  . Healthy Mother   . Healthy Father     SOCIAL HISTORY: History   Social History  . Marital Status: Single    Spouse Name: N/A  . Number of Children: 0  . Years of Education: college   Occupational  History  .     Social History Main Topics  . Smoking status: Never Smoker   . Smokeless tobacco: Never Used  . Alcohol Use: No  . Drug Use: No  . Sexual Activity: Yes   Other Topics Concern  . Not on file   Social History Narrative   Patient lives at home with her boyfriend. Patient works full time at [employer].   Education- College   Right handed.   Caffeine- coffee four times a week.     PHYSICAL EXAM  Filed Vitals:   02/10/15  0806  BP: 131/83  Pulse: 84  Height: 5\' 7"  (1.702 m)  Weight: 177 lb 12.8 oz (80.65 kg)   Body mass index is 27.84 kg/(m^2). Generalized: Well developed, in no acute distress  Head: normocephalic and atraumatic,. Oropharynx benign  Musculoskeletal: No deformity   Neurological examination   Mentation: Alert oriented to time, place, history taking. Follows all commands speech and language fluent ESS 11.   Cranial nerve II-XII: Pupils were equal round reactive to light extraocular movements were full, visual field were full on confrontational test. Facial sensation and strength were normal. hearing was intact to finger rubbing bilaterally. Uvula tongue midline. head turning and shoulder shrug were normal and symmetric.Tongue protrusion into cheek strength was normal. Motor: normal bulk and tone, full strength in the BUE, BLE, fine finger movements normal, no pronator drift. No focal weakness Coordination: finger-nose-finger, heel-to-shin bilaterally, no dysmetria Reflexes: 1+ upper lower and symmetric, plantar responses were flexor bilaterally. Gait and Station: Rising up from seated position without assistance, normal stance, moderate stride, good arm swing, smooth turning, able to perform tiptoe, and heel walking without difficulty. Tandem gait is steady  DIAGNOSTIC DATA (LABS, IMAGING, TESTING) -  ASSESSMENT AND PLAN  34 y.o. year old female  has a past medical history of Seizures; Anxiety;  and insomnia here to follow-up for seizure disorder. Last seizure occurred in 01/23/2015 after abruptly stopping her Klonopin for more than a week.  Continue Vimpat at current dose will refill Continue Lamictal at current dose will refill Trough level of Lamictal later in the week Alternate Klonopin half a tab every other day with a full tab every other day for 2 weeks then decrease to half a tab every other day for 2 weeks then call the office for further direction Set up for sleep study after  discontinuation of Klonopin Follow-up in 3 months [name], [facility], [facility], [facility] [address], [place], [place] [address] [phone]

## 2015-02-10 NOTE — Patient Instructions (Addendum)
Continue Vimpat at current dose will refill Continue Lamictal at current dose will refill Trough level of Lamictal later in the week Alternate Klonopin half a tab every other day with a full tab every other day for 2 weeks then decrease to half a tab every other day for 2 weeks then call the office for further direction Set up for sleep study after discontinuation of Klonopin Follow-up in 3 months

## 2015-02-11 ENCOUNTER — Ambulatory Visit (INDEPENDENT_AMBULATORY_CARE_PROVIDER_SITE_OTHER): Payer: 59 | Admitting: Family Medicine

## 2015-02-11 DIAGNOSIS — M6248 Contracture of muscle, other site: Secondary | ICD-10-CM | POA: Diagnosis not present

## 2015-02-11 DIAGNOSIS — M62838 Other muscle spasm: Secondary | ICD-10-CM

## 2015-02-11 DIAGNOSIS — R569 Unspecified convulsions: Secondary | ICD-10-CM | POA: Diagnosis not present

## 2015-02-11 MED ORDER — KETOROLAC TROMETHAMINE 60 MG/2ML IM SOLN
60.0000 mg | Freq: Once | INTRAMUSCULAR | Status: AC
  Administered 2015-02-11: 60 mg via INTRAMUSCULAR

## 2015-02-11 NOTE — Assessment & Plan Note (Signed)
Seizure 01/23/2015. See note on 02/11/2015. This is followed by neurology.

## 2015-02-11 NOTE — Addendum Note (Signed)
Addended by: Katharina Caper, Darene Nappi D on: 02/11/2015 10:15 AM   Modules accepted: Orders

## 2015-02-11 NOTE — Progress Notes (Signed)
   Subjective:    Patient ID: Anita [name], female    DOB: 1980-09-25, 34 y.o.   MRN: [id number]  HPI Acute onset last evening of left-sided neck pain radiating around the left ear and into the left trapezius muscle. Ache, 6 out of 10. She took some Tylenol did not seem to help much. She is a little worried because she's never had any pain similar to this before. She's not having any problems hearing, no visual changes, no rash noted, has not had fever, chills, sweats. Denies nausea, no chest pain, shortness of breath.  Only additional medical history since I saw her last is that she was trying to taper off her Klonopin which she takes for seizure anxiety disorder and evidently she abruptly stopped it. Had a seizure on May 27. Was not driving. Says her neurologist is aware of this. They have come up with a more reasonable plan to taper off Klonopin.  Review of Systems See history of present illness    Objective:   Physical Exam Vital signs are reviewed GEN.: Well-developed female no acute distress HEENT: Neck is supple with full range of motion. There is one area of muscle spasm is very prominent and tender to palpation in the left posterior neck radiating down to the left trapezius. Palpation here increases her head and neck pain somewhat. Pupils are equal round reactive to light, extraocular muscles are intact, conjunctivae nonicteric. Oropharynx is benign. TMs bilaterally are somewhat retracted but still have a good cone of light and are mobile. SKIN: There is no rash, no vesicles noted around the left ear were in the external auditory canal. CV: Regular rate and rhythm       Assessment & Plan:  Neck pain which I think is related to muscle spasm. Other things that would include in the differential are ear infection but there is no sign of that. The mastoid is nontender so don't think she's dealing with anything infectious such as mastoiditis. She's had no fever. Could be early presentation  of shingles and we discussed that. If she starts developing rash she will let me know immediately. In clinic we gave her Toradol injection and she will take 6oo OTC milligrams ibuprofen every 8 hours with food over the next 2-3 days. Follow-up if not improving or follow-up sooner if she has new or worsening symptoms.

## 2015-02-12 NOTE — Progress Notes (Signed)
I have reviewed and agreed above plan. 

## 2015-02-16 ENCOUNTER — Other Ambulatory Visit: Payer: Self-pay | Admitting: Neurology

## 2015-02-18 ENCOUNTER — Other Ambulatory Visit: Payer: Self-pay | Admitting: Nurse Practitioner

## 2015-02-18 MED ORDER — CLONAZEPAM 0.5 MG PO TABS: ORAL_TABLET | ORAL | Status: DC

## 2015-02-18 NOTE — Telephone Encounter (Signed)
Patient is calling to get a Rx for clonazePAM (KLONOPIN) 0.5 MG tablet sent to [facility] on [address]. The Rx was to be sent but the pharmacy states they do not have it. Thank you.

## 2015-02-18 NOTE — Telephone Encounter (Signed)
Patient needs new Rx for Clonazepam with updated instructions.  Last OV note says: Alternate Klonopin half a tab every other day with a full tab every other day for 2 weeks then decrease to half a tab every other day for 2 weeks then call the office for further direction.  Request entered, forwarded to provider for approval.

## 2015-02-18 NOTE — Telephone Encounter (Signed)
Rx has been signed and faxed  

## 2015-03-11 ENCOUNTER — Encounter: Payer: Self-pay | Admitting: Family Medicine

## 2015-03-11 ENCOUNTER — Ambulatory Visit (INDEPENDENT_AMBULATORY_CARE_PROVIDER_SITE_OTHER): Payer: 59 | Admitting: Family Medicine

## 2015-03-11 VITALS — BP 129/82 | HR 106 | Temp 98.3°F | Ht 67.0 in | Wt 177.8 lb

## 2015-03-11 DIAGNOSIS — L0292 Furuncle, unspecified: Secondary | ICD-10-CM

## 2015-03-11 MED ORDER — FLUCONAZOLE 100 MG PO TABS: 100.0000 mg | ORAL_TABLET | Freq: Every day | ORAL | Status: DC

## 2015-03-11 MED ORDER — CEPHALEXIN 500 MG PO CAPS: 500.0000 mg | ORAL_CAPSULE | Freq: Two times a day (BID) | ORAL | Status: DC

## 2015-03-11 NOTE — Progress Notes (Signed)
   Subjective:    Patient ID: Anita [name], female    DOB: 04/05/1981, 34 y.o.   MRN: [id number]  HPI Lumps under left axilla. Tender. Noticed them 3 or 4 days ago in they've gotten a little bit worse. They're red but not hot. No discharge. She also has one in her right groin area there is much smaller and single. Has never had this type of lesion before. Feels otherwise well.   Review of Systems No fever, sweats, chills. No unusual fatigue, no unusual bruising.    Objective:   Physical Exam  Vital signs reviewed GEN.: Well-developed female no acute distress SKIN: Left axilla reveals several subcutaneous nodular areas in a linear path with some superficial erythema, no discharge, no excess warmth. There is no lymphadenopathy in either axilla, no supraclavicular or neck lymphadenopathy. The lesion in the right groin is single, 3 or 4 mm in diameter, subcutaneous.      Assessment & Plan:  This is most likely a folliculitis related to shaving. I'll put her on some anabiotics orally for about a week and see if it resolves. 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 She's never had that before and it would be unusual to have her first signs at age 34. She'll calm if this does not resolve or she has any new or worsening symptoms.

## 2015-03-13 ENCOUNTER — Encounter: Payer: Self-pay | Admitting: Family Medicine

## 2015-03-13 ENCOUNTER — Ambulatory Visit (INDEPENDENT_AMBULATORY_CARE_PROVIDER_SITE_OTHER): Payer: 59 | Admitting: Family Medicine

## 2015-03-13 DIAGNOSIS — L0293 Carbuncle, unspecified: Secondary | ICD-10-CM

## 2015-03-13 MED ORDER — CLINDAMYCIN HCL 300 MG PO CAPS: 300.0000 mg | ORAL_CAPSULE | Freq: Four times a day (QID) | ORAL | Status: DC

## 2015-03-13 MED ORDER — ACETAMINOPHEN 500 MG PO TABS
1500.0000 mg | ORAL_TABLET | Freq: Once | ORAL | Status: AC
  Administered 2015-03-13: 1500 mg via ORAL

## 2015-03-13 NOTE — Progress Notes (Addendum)
Subjective: Anita [name] is a 34 y.o. female patient of Dr. Verlon Au presenting for carbuncle/boil.   An intensely painful red, raised lesion grew from perifollicular inflammation in the right upper leg crease over the past 3-4 days. She was evaluated for similar lesion in her left axilla on 7/13 by PCP and treated with keflex. Both areas have enlarged and become more painful despite taking this as directed. Advil help the pain somewhat, but she called out of work today due to severe pain with walking and sitting.  - ROS: No fevers, sweats, chills, fatigue, unusual bleeding or bruising.  Objective: LMP 02/16/2015 Gen: Afebrile, well-appearing 34 y.o. female  limping into exam room and grimacing in pain with sitting.  Skin: About 2 inches from, and not involving, the right vaginal labia is an approximately 1.5in x 1in raised erythematous tender indurated abscess with some fluctuance and surrounding erythema. Left axilla with slightly tender single deep subcutaneous nodular erythematous area without warmth or discharge. Lymph: No inguinal, axillary or cervical lymphadenopathy.   INCISION AND DRAINAGE OF ABSCESS PROCEDURE:  A timeout protocol was performed prior to initiating the procedure. The area was prepared and draped in the usual, sterile manner. The site was anesthetized with 10cc 2% lidocaine with epinephrine. A small (~0.5cm long) stab incision along the local skin lines was made and a small volume of purulent material expressed. Loculations were disrupted with hemostat exploration. ~1.5cm iodoform packing was loosely place in abscess cavity with ~1cm exposed tail. Bleeding was minimal. The patient tolerated the procedure well without complications.  Standard post-procedure care is explained and return precautions are given.  Leonia Corona, CMA was present for the entirety of this encounter.   Assessment/Plan: Anita [name] is a 34 y.o. female here for perivaginal carbuncle.   See problem  list for plan.

## 2015-03-13 NOTE — Patient Instructions (Signed)
You need to be seen   Incision Care  An incision is a surgical cut to open your skin. You need to take care of your incision. This helps you to not get an infection. HOME CARE  Only take medicine as told by your doctor.  Do not take off your bandage (dressing) or get your incision wet until your doctor approves. Change the bandage and call your doctor if the bandage gets wet, dirty, or starts to smell.  Take showers. Do not take baths, swim, or do anything that may soak your incision until it heals.  Return to your normal diet and activities as told or allowed by your doctor.  Avoid lifting any weight until your doctor approves.  Put medicine that helps lessen itching on your incision as told by your doctor. Do not pick or scratch at your incision.  Keep your doctor visit to have your stitches (sutures) or staples removed.  Drink enough fluids to keep your pee (urine) clear or pale yellow. GET HELP RIGHT AWAY IF:  You have a fever.  You have a rash.  You are dizzy, or you pass out (faint) while standing.  You have trouble breathing.  You have a reaction or side effects to medicine given to you.  You have redness, puffiness (swelling), or more pain in the incision and medicine does not help.  You have fluid, blood, or yellowish-white fluid (pus) coming from the incision lasting over 1 day.  You have muscle aches, chills, or you feel sick.  You have a bad smell coming from the incision or bandage.  Your incision opens up after stitches, staples, or sticky strips have been removed.  You keep feeling sick to your stomach (nauseous) or keep throwing up (vomiting). MAKE SURE YOU:   Understand these instructions.  Will watch your condition.  Will get help right away if you are not doing well or get worse. Document Released: 11/07/2011 Document Reviewed: 10/09/2013 [facility] Patient Information 2015 [place]. This information is not intended to replace advice given  to you by your health care provider. Make sure you discuss any questions you have with your health care provider.

## 2015-03-13 NOTE — Assessment & Plan Note (Addendum)
I&D today with minimal packing to maintain drainage. Standard post-procedure care explained and supplies given for dressing changes. Follow up scheduled for 7/18 at 3:15pm for recheck. Will call me if worsening before that time. Given cellulitis and worsening after starting keflex, I suspect MRSA and will Rx clindamycin (Preg cat. B as she is attempting to become pregnant).

## 2015-03-16 ENCOUNTER — Encounter: Payer: Self-pay | Admitting: Family Medicine

## 2015-03-16 ENCOUNTER — Ambulatory Visit (INDEPENDENT_AMBULATORY_CARE_PROVIDER_SITE_OTHER): Payer: 59 | Admitting: Family Medicine

## 2015-03-16 VITALS — BP 130/88 | HR 109 | Temp 98.0°F | Ht 67.0 in | Wt 178.0 lb

## 2015-03-16 DIAGNOSIS — L0293 Carbuncle, unspecified: Secondary | ICD-10-CM

## 2015-03-16 NOTE — Patient Instructions (Signed)
Community-Associated MRSA °CA-MRSA stands for community-associated methicillin-resistant Staphylococcus aureus. MRSA is a type of bacteria that is resistant to some common antibiotics. It can cause infections in the skin and many other places in the body. Staphylococcus aureus, often called "staph," is a bacteria that normally lives on the skin or in the nose. Staph on the surface of the skin or in the nose does not cause problems. However, if the staph enters the body through a cut, wound, or break in the skin, an infection can happen. °Up until recently, infections with the MRSA type of staph mainly occurred in hospitals and other health care settings. There are now increasing problems with MRSA infections in the community as well. Infections with MRSA may be very serious or even life threatening. °CA-MRSA is becoming more common. It is known to spread in crowded settings, in jails and prisons, and in situations where there is close skin-to-skin contact, such as during sporting events or in locker rooms. MRSA can be spread through shared items, such as children's toys, razors, towels, or sports equipment.  °CAUSES °All staph, including MRSA, are normally harmless unless they enter the body through a scratch, cut, or wound, such as with surgery. All staph, including MRSA, can be spread from person-to-person by touching contaminated objects or through direct contact. °· MRSA now causes illness in people who have not been in hospitals or other health care facilities. Cases of MRSA diseases in the community have been associated with: °¨ Recent antibiotic use. °¨ Sharing contaminated towels or clothes. °¨ Having active skin diseases. °¨ Participating in contact sports. °¨ Living in crowded settings. °¨ Intravenous (IV) drug use. °· Community-associated MRSA infections are usually skin infections, but may cause other severe illnesses. °· Staph bacteria are one of the most common causes of skin infection. However, they  are also a common cause of pneumonia, bone or joint infections, and bloodstream infections. °DIAGNOSIS °Diagnosis of MRSA is done by cultures of fluid samples that may come from: °· Swabs taken from cuts or wounds in infected areas. °· Nasal swabs. °· Saliva or deep cough specimens from the lungs (sputum). °· Urine. °· Blood. °Many people are "colonized" with MRSA but have no signs of infection. This means that people carry the MRSA germ on their skin or in their nose and may never develop MRSA infection.  °TREATMENT  °Treatment varies and is based on how serious, how deep, or how extensive the infection is. For example: °· Some skin infections, such as a small boil or abscess, may be treated by draining yellowish-white fluid (pus) from the site of the infection. °· Deeper or more widespread soft tissue infections are usually treated with surgery to drain pus and with antibiotic medicine given by vein or by mouth. This may be recommended even if you are pregnant. °· Serious infections may require a hospital stay. °If antibiotics are given, they may be needed for several weeks. °PREVENTION °Because many people are colonized with staph, including MRSA, preventing the spread of the bacteria from person-to-person is most important. The best way to prevent the spread of bacteria and other germs is through proper hand washing or by using alcohol-based hand disinfectants. The following are other ways to help prevent MRSA infection within community settings.  °· Wash your hands frequently with soap and water for at least 15 seconds. Otherwise, use alcohol-based hand disinfectants when soap and water is not available. °· Make sure people who live with you wash their hands often, too. °·   Do not share personal items. For example, avoid sharing razors and other personal hygiene items, towels, clothing, and athletic equipment. °· Wash and dry your clothes and bedding at the warmest temperatures recommended on the labels. °· Keep  wounds covered. Pus from infected sores may contain MRSA and other bacteria. Keep cuts and abrasions clean and covered with germ-free (sterile), dry bandages until they are healed. °· If you have a wound that appears infected, ask your caregiver if a culture for MRSA and other bacteria should be done. °· If you are breastfeeding, talk to your caregiver about MRSA. You may be asked to temporarily stop breastfeeding. °HOME CARE INSTRUCTIONS  °· Take your antibiotics as directed. Finish them even if you start to feel better. °· Avoid close contact with those around you as much as possible. Do not use towels, razors, toothbrushes, bedding, or other items that will be used by others. °· To fight the infection, follow your caregiver's instructions for wound care. Wash your hands before and after changing your bandages. °· If you have an intravascular device, such as a catheter, make sure you know how to care for it. °· Be sure to tell any health care providers that you have MRSA so they are aware of your infection. °SEEK IMMEDIATE MEDICAL CARE IF: °· The infection appears to be getting worse. Signs include: °¨ Increased warmth, redness, or tenderness around the wound site. °¨ A red line that extends from the infection site. °¨ A dark color in the area around the infection. °¨ Wound drainage that is tan, yellow, or green. °¨ A bad smell coming from the wound. °· You feel sick to your stomach (nauseous) and throw up (vomit) or cannot keep medicine down. °· You have a fever. °· Your baby is older than 3 months with a rectal temperature of 102°F (38.9°C) or higher. °· Your baby is 3 months old or younger with a rectal temperature of 100.4°F (38°C) or higher. °· You have difficulty breathing. °MAKE SURE YOU:  °· Understand these instructions. °· Will watch your condition. °· Will get help right away if you are not doing well or get worse. °Document Released: 11/18/2005 Document Revised: 12/30/2013 Document Reviewed:  11/18/2010 °[employer]® Patient Information ©2015 [employer]. This information is not intended to replace advice given to you by your health care provider. Make sure you discuss any questions you have with your health care provider. ° °

## 2015-03-16 NOTE — Assessment & Plan Note (Signed)
Much improved - regression of erythema and induration showing organism is sensitive - should be completely resolved by end of abx. Told to call if not continuing to resolve. No shaving for some time and throw away razor she used. Also wash with hibiclens recommended.

## 2015-03-16 NOTE — Progress Notes (Signed)
Subjective: Anita [name] is a 34 y.o. female for wound recheck.  Pain much better, lesion smaller. Packing fell out 48 hrs after I&D and continued to drain until earlier this morning.   - ROS: No fevers.   Objective: BP 130/88 mmHg  Pulse 109  Temp(Src) 98 F (36.7 C) (Oral)  Ht 5\' 7"  (1.702 m)  Wt 178 lb (80.74 kg)  BMI 27.87 kg/m2  LMP 02/16/2015 Gen: Well 34 y.o. female in no distress Skin: Largely regressed erythema atop < 1cm diameter induration to the right of vaginal labia without fluctuance. Axillary lesion resolved. Lymph: No inguinal, axillary or cervical lymphadenopathy.   Anita [name], CMA was present for the entirety of this encounter.   Assessment/Plan: Anita [name] is a 34 y.o. female here for carbuncle effectively treated with clindamycin.

## 2015-03-19 ENCOUNTER — Other Ambulatory Visit: Payer: Self-pay | Admitting: Family Medicine

## 2015-03-19 DIAGNOSIS — L0293 Carbuncle, unspecified: Secondary | ICD-10-CM

## 2015-03-19 MED ORDER — CLINDAMYCIN HCL 300 MG PO CAPS: 300.0000 mg | ORAL_CAPSULE | Freq: Four times a day (QID) | ORAL | Status: DC

## 2015-05-13 ENCOUNTER — Ambulatory Visit (INDEPENDENT_AMBULATORY_CARE_PROVIDER_SITE_OTHER): Payer: 59 | Admitting: Nurse Practitioner

## 2015-05-13 ENCOUNTER — Encounter: Payer: Self-pay | Admitting: Nurse Practitioner

## 2015-05-13 VITALS — BP 114/79 | HR 77 | Ht 67.0 in | Wt 178.0 lb

## 2015-05-13 DIAGNOSIS — G40309 Generalized idiopathic epilepsy and epileptic syndromes, not intractable, without status epilepticus: Secondary | ICD-10-CM

## 2015-05-13 DIAGNOSIS — R569 Unspecified convulsions: Secondary | ICD-10-CM

## 2015-05-13 MED ORDER — LAMICTAL 200 MG PO TABS: 300.0000 mg | ORAL_TABLET | Freq: Two times a day (BID) | ORAL | Status: DC

## 2015-05-13 NOTE — Patient Instructions (Signed)
Continue Vimpat at current dose Continue Lamictal 300mg  twice daily Obtain trough level in the next week F/U in 6 months

## 2015-05-13 NOTE — Progress Notes (Signed)
[facility]  PATIENT: HARLEY MCCARTNEY DOB: 01-Dec-1980   REASON FOR VISIT: Follow-up for generalized seizure disorder  HISTORY FROM: Patient    HISTORY OF PRESENT ILLNESS:[name], 34 year old female returns for followup. She was last seen in this office 02/12/15. She has a history of generalized seizure disorder with last seizure occurring Jan 23, 2015 after stopping her Klonopin abruptly for 1 week. She is trying to get pregnant and wanted to be off of the drug. She takes Klonopin to help her sleep at night . She has now tapered off of the drug over several months.  She is currently on Vimpat 200 mg at bedtime and Lamictal 300 mg in the morning and 300 at night.  She continues to work full time. She has had no other new neurologic complaints. She returns for reevaluation    HISTORY: generalized seizure disorder. Date of last seizure 08/24/2012 when she hit her head on the table after having a seizure. CT of the head was normal. She denies further staring spells, confusion, sleep disturbances, lapses of time, headache and bowel and bladder incontinence. She continues to work full-time at [employer] . She did not get her Lamictal level after last visit. She is not driving.    REVIEW OF SYSTEMS: Full 14 system review of systems performed and notable only for those listed, all others are neg:  Constitutional: neg  Cardiovascular: neg Ear/Nose/Throat: neg  Skin: neg Eyes: neg Respiratory: neg Gastroitestinal: neg  Hematology/Lymphatic: neg  Endocrine: neg Musculoskeletal:neg Allergy/Immunology: neg Neurological: Seizure disorder Psychiatric: Anxiety Sleep : Restless leg   ALLERGIES: Allergies  Allergen Reactions  . Dye Fdc Red [Dye Fdc Red 3 (Erythrosine)]   . Flagyl [Metronidazole Hcl]     GI upset  . Morphine And Related     vomitting    HOME MEDICATIONS: Outpatient Prescriptions Prior to Visit  Medication Sig Dispense Refill  . lacosamide  (VIMPAT) 200 MG TABS tablet Take 1 tablet (200 mg total) by mouth at bedtime. 90 tablet 1  . LAMICTAL 200 MG tablet Take 1.5 tablets (300 mg total) by mouth 2 (two) times daily. TAKE 1.5 TABLETS BID 270 tablet 1  . clindamycin (CLEOCIN) 300 MG capsule Take 1 capsule (300 mg total) by mouth 4 (four) times daily. (Patient not taking: Reported on 05/13/2015) 21 capsule 0  . clonazePAM (KLONOPIN) 0.5 MG tablet Alternate one half a tab every other day with a full tab every other day for 2 weeks then decrease to half a tab every other day for 2 weeks then call the office for further direction (Patient not taking: Reported on 05/13/2015) 30 tablet 0  . fluconazole (DIFLUCAN) 100 MG tablet Take 1 tablet (100 mg total) by mouth daily. (Patient not taking: Reported on 05/13/2015) 3 tablet 1  . ipratropium (ATROVENT) 0.06 % nasal spray Place 2 sprays into both nostrils every 4 (four) hours as needed for rhinitis. (Patient not taking: Reported on 02/10/2015) 15 mL 1  . norgestimate-ethinyl estradiol (ORTHO-CYCLEN,SPRINTEC,PREVIFEM) 0.25-35 MG-MCG tablet Take 1 tablet by mouth daily. (Patient not taking: Reported on 02/10/2015) 3 Package 3   No facility-administered medications prior to visit.    PAST MEDICAL HISTORY: Past Medical History  Diagnosis Date  . Seizures   . Anxiety   . Heart murmur     PAST SURGICAL HISTORY: History reviewed. No pertinent past surgical history.  FAMILY HISTORY: Family History  Problem Relation Age of Onset  . Healthy Mother   . Healthy Father  SOCIAL HISTORY: Social History   Social History  . Marital Status: Single    Spouse Name: N/A  . Number of Children: 0  . Years of Education: college   Occupational History  .  [employer]   Social History Main Topics  . Smoking status: Never Smoker   . Smokeless tobacco: Never Used  . Alcohol Use: No  . Drug Use: No  . Sexual Activity: Yes   Other Topics Concern  . Not on file   Social History Narrative    Patient lives at home with her boyfriend. Patient works full time at [employer].   Education- College   Right handed.   Caffeine- coffee four times a week.   Trying to get pregnant.  05-13-15     PHYSICAL EXAM  Filed Vitals:   05/13/15 0750  BP: 114/79  Pulse: 77  Height: 5\' 7"  (1.702 m)  Weight: 178 lb (80.74 kg)   Body mass index is 27.87 kg/(m^2). Generalized: Well developed, in no acute distress  Head: normocephalic and atraumatic,. Oropharynx benign  Musculoskeletal: No deformity   Neurological examination   Mentation: Alert oriented to time, place, history taking. Follows all commands speech and language fluent  Cranial nerve II-XII: Pupils were equal round reactive to light extraocular movements were full, visual field were full on confrontational test. Facial sensation and strength were normal. hearing was intact to finger rubbing bilaterally. Uvula tongue midline. head turning and shoulder shrug were normal and symmetric.Tongue protrusion into cheek strength was normal. Motor: normal bulk and tone, full strength in the BUE, BLE, fine finger movements normal, no pronator drift. No focal weakness Coordination: finger-nose-finger, heel-to-shin bilaterally, no dysmetria Reflexes: 1+ upper lower and symmetric, plantar responses were flexor bilaterally. Gait and Station: Rising up from seated position without assistance, normal stance, moderate stride, good arm swing, smooth turning, able to perform tiptoe, and heel walking without difficulty. Tandem gait is steady  DIAGNOSTIC DATA (LABS, IMAGING, TESTING) -  ASSESSMENT AND PLAN  34 y.o. year old female  has a past medical history of Seizures;  here to follow-up. Last seizure occurred in 01/23/2015 after stopping her Klonopin abruptly. She has now tapered off the Klonopin over 2 month period of time. She is wanting to get pregnant and was made aware she would be high risk because of  seizure disorder  Continue Vimpat at  current dose Continue Lamictal 300mg  twice daily Obtain trough level in the next week If she becomes pregnant we will need to check Lamictal levels every 3 months, she verbalizes understanding at this F/U in 6 months [name], [facility], [facility], [facility] [address], [place], [place] [address] [phone]

## 2015-05-13 NOTE — Progress Notes (Signed)
I have reviewed and agreed above plan. 

## 2015-06-15 ENCOUNTER — Telehealth: Payer: Self-pay | Admitting: *Deleted

## 2015-06-15 ENCOUNTER — Encounter: Payer: Self-pay | Admitting: *Deleted

## 2015-06-15 NOTE — Telephone Encounter (Signed)
I mailed a letter reminding pt that CM/NP ordered lab work for her to have done (trough level lamictal).  To come by office and have done or if done somewhere else to have them send copy of results.

## 2015-09-03 ENCOUNTER — Other Ambulatory Visit: Payer: Self-pay | Admitting: Nurse Practitioner

## 2015-09-03 MED FILL — VIMPAT 200 MG TABLET: 200 | 30 days supply | Qty: 30 | Fill #0

## 2015-09-10 ENCOUNTER — Telehealth: Payer: Self-pay | Admitting: Nurse Practitioner

## 2015-09-10 DIAGNOSIS — N926 Irregular menstruation, unspecified: Secondary | ICD-10-CM | POA: Diagnosis not present

## 2015-09-18 ENCOUNTER — Encounter (HOSPITAL_COMMUNITY): Payer: Self-pay | Admitting: *Deleted

## 2015-10-02 DIAGNOSIS — N946 Dysmenorrhea, unspecified: Secondary | ICD-10-CM | POA: Diagnosis not present

## 2015-10-02 DIAGNOSIS — R102 Pelvic and perineal pain: Secondary | ICD-10-CM | POA: Diagnosis not present

## 2015-10-07 MED FILL — VIMPAT 200 MG TABLET: 200 | 30 days supply | Qty: 30 | Fill #1

## 2015-10-08 ENCOUNTER — Encounter (HOSPITAL_COMMUNITY): Payer: Self-pay | Admitting: Anesthesiology

## 2015-10-08 MED ORDER — DEXTROSE 5 % IV SOLN
2.0000 g | INTRAVENOUS | Status: AC
  Administered 2015-10-09: 2 g via INTRAVENOUS
  Filled 2015-10-08: qty 2

## 2015-10-08 NOTE — Anesthesia Preprocedure Evaluation (Addendum)
Anesthesia Evaluation  Patient identified by MRN, date of birth, ID band Patient awake    Reviewed: Allergy & Precautions, NPO status , Patient's Chart, lab work & pertinent test results  Airway Mallampati: I  TM Distance: >3 FB     Dental   Pulmonary neg pulmonary ROS,    Pulmonary exam normal        Cardiovascular negative cardio ROS Normal cardiovascular exam+ Valvular Problems/Murmurs      Neuro/Psych Seizures -, Well Controlled,  Anxiety    GI/Hepatic negative GI ROS, Neg liver ROS,   Endo/Other  negative endocrine ROS  Renal/GU negative Renal ROS     Musculoskeletal negative musculoskeletal ROS (+)   Abdominal   Peds  Hematology negative hematology ROS (+)   Anesthesia Other Findings   Reproductive/Obstetrics Dysmenorrhea Pelvic pain  Menorrhagia                            Anesthesia Physical Anesthesia Plan  ASA: II  Anesthesia Plan: General   Post-op Pain Management:    Induction: Intravenous  Airway Management Planned: Oral ETT  Additional Equipment:   Intra-op Plan:   Post-operative Plan: Extubation in OR  Informed Consent: I have reviewed the patients History and Physical, chart, labs and discussed the procedure including the risks, benefits and alternatives for the proposed anesthesia with the patient or authorized representative who has indicated his/her understanding and acceptance.   Dental advisory given  Plan Discussed with: CRNA, Anesthesiologist and Surgeon  Anesthesia Plan Comments:         Anesthesia Quick Evaluation

## 2015-10-09 ENCOUNTER — Ambulatory Visit (HOSPITAL_COMMUNITY)
Admission: RE | Admit: 2015-10-09 | Discharge: 2015-10-09 | Disposition: A | Discharge status: Home / Self Care | Payer: 59 | Source: Ambulatory Visit | Attending: Obstetrics and Gynecology | Admitting: Obstetrics and Gynecology

## 2015-10-09 ENCOUNTER — Ambulatory Visit (HOSPITAL_COMMUNITY): Payer: 59 | Admitting: Anesthesiology

## 2015-10-09 ENCOUNTER — Encounter (HOSPITAL_COMMUNITY): Payer: Self-pay

## 2015-10-09 ENCOUNTER — Encounter (HOSPITAL_COMMUNITY)
Admission: RE | Disposition: A | Discharge status: Home / Self Care | Payer: Self-pay | Source: Ambulatory Visit | Attending: Obstetrics and Gynecology

## 2015-10-09 DIAGNOSIS — N920 Excessive and frequent menstruation with regular cycle: Secondary | ICD-10-CM | POA: Diagnosis not present

## 2015-10-09 DIAGNOSIS — N736 Female pelvic peritoneal adhesions (postinfective): Secondary | ICD-10-CM | POA: Diagnosis not present

## 2015-10-09 DIAGNOSIS — N941 Unspecified dyspareunia: Secondary | ICD-10-CM | POA: Diagnosis not present

## 2015-10-09 DIAGNOSIS — N800 Endometriosis of uterus: Secondary | ICD-10-CM | POA: Diagnosis not present

## 2015-10-09 DIAGNOSIS — N802 Endometriosis of fallopian tube: Secondary | ICD-10-CM | POA: Insufficient documentation

## 2015-10-09 DIAGNOSIS — N801 Endometriosis of ovary: Secondary | ICD-10-CM | POA: Insufficient documentation

## 2015-10-09 DIAGNOSIS — R102 Pelvic and perineal pain: Secondary | ICD-10-CM | POA: Insufficient documentation

## 2015-10-09 DIAGNOSIS — N840 Polyp of corpus uteri: Secondary | ICD-10-CM | POA: Insufficient documentation

## 2015-10-09 DIAGNOSIS — N803 Endometriosis of pelvic peritoneum: Secondary | ICD-10-CM | POA: Insufficient documentation

## 2015-10-09 DIAGNOSIS — N946 Dysmenorrhea, unspecified: Secondary | ICD-10-CM | POA: Diagnosis not present

## 2015-10-09 DIAGNOSIS — N945 Secondary dysmenorrhea: Secondary | ICD-10-CM | POA: Diagnosis not present

## 2015-10-09 HISTORY — PX: LAPAROSCOPY: SHX197

## 2015-10-09 HISTORY — PX: HYSTEROSCOPY WITH D & C: SHX1775

## 2015-10-09 LAB — TYPE AND SCREEN
ABO/RH(D): O POS
ANTIBODY SCREEN: NEGATIVE

## 2015-10-09 LAB — CBC
HCT: 40 % (ref 36.0–46.0)
Hemoglobin: 13.7 g/dL (ref 12.0–15.0)
MCH: 30.4 pg (ref 26.0–34.0)
MCHC: 34.3 g/dL (ref 30.0–36.0)
MCV: 88.7 fL (ref 78.0–100.0)
PLATELETS: 376 10*3/uL (ref 150–400)
RBC: 4.51 MIL/uL (ref 3.87–5.11)
RDW: 13 % (ref 11.5–15.5)
WBC: 4.6 10*3/uL (ref 4.0–10.5)

## 2015-10-09 LAB — PREGNANCY, URINE: PREG TEST UR: NEGATIVE

## 2015-10-09 LAB — ABO/RH: ABO/RH(D): O POS

## 2015-10-09 SURGERY — LAPAROSCOPY, DIAGNOSTIC
Anesthesia: General

## 2015-10-09 MED ORDER — ROCURONIUM BROMIDE 100 MG/10ML IV SOLN
INTRAVENOUS | Status: DC | PRN
  Administered 2015-10-09: 35 mg via INTRAVENOUS

## 2015-10-09 MED ORDER — LACTATED RINGERS IV SOLN
INTRAVENOUS | Status: DC
  Administered 2015-10-09 (×3): via INTRAVENOUS

## 2015-10-09 MED ORDER — GLYCOPYRROLATE 0.2 MG/ML IJ SOLN
INTRAMUSCULAR | Status: AC
  Filled 2015-10-09: qty 3

## 2015-10-09 MED ORDER — MIDAZOLAM HCL 2 MG/2ML IJ SOLN
INTRAMUSCULAR | Status: DC | PRN
  Administered 2015-10-09: 1 mg via INTRAVENOUS

## 2015-10-09 MED ORDER — PROPOFOL 10 MG/ML IV BOLUS
INTRAVENOUS | Status: DC | PRN
  Administered 2015-10-09: 180 mg via INTRAVENOUS

## 2015-10-09 MED ORDER — HYDROMORPHONE HCL 1 MG/ML IJ SOLN: 0.5000 mg | INTRAMUSCULAR | Status: DC | PRN

## 2015-10-09 MED ORDER — PROMETHAZINE HCL 25 MG/ML IJ SOLN
6.2500 mg | INTRAMUSCULAR | Status: DC | PRN
  Administered 2015-10-09: 6.25 mg via INTRAVENOUS

## 2015-10-09 MED ORDER — DEXAMETHASONE SODIUM PHOSPHATE 10 MG/ML IJ SOLN
INTRAMUSCULAR | Status: DC | PRN
  Administered 2015-10-09: 4 mg via INTRAVENOUS

## 2015-10-09 MED ORDER — PROMETHAZINE HCL 25 MG/ML IJ SOLN
INTRAMUSCULAR | Status: AC
  Administered 2015-10-09: 6.25 mg via INTRAVENOUS
  Filled 2015-10-09: qty 1

## 2015-10-09 MED ORDER — KETOROLAC TROMETHAMINE 30 MG/ML IJ SOLN
INTRAMUSCULAR | Status: DC | PRN
  Administered 2015-10-09: 30 mg via INTRAVENOUS

## 2015-10-09 MED ORDER — METOCLOPRAMIDE HCL 5 MG/ML IJ SOLN
10.0000 mg | Freq: Once | INTRAMUSCULAR | Status: AC | PRN
  Administered 2015-10-09: 10 mg via INTRAVENOUS

## 2015-10-09 MED ORDER — METOCLOPRAMIDE HCL 5 MG/ML IJ SOLN
INTRAMUSCULAR | Status: AC
  Filled 2015-10-09: qty 2

## 2015-10-09 MED ORDER — NEOSTIGMINE METHYLSULFATE 10 MG/10ML IV SOLN
INTRAVENOUS | Status: AC
  Filled 2015-10-09: qty 1

## 2015-10-09 MED ORDER — BUPIVACAINE HCL (PF) 0.25 % IJ SOLN
INTRAMUSCULAR | Status: AC
  Filled 2015-10-09: qty 30

## 2015-10-09 MED ORDER — GLYCINE 1.5 % IR SOLN
Status: DC | PRN
  Administered 2015-10-09: 3000 mL

## 2015-10-09 MED ORDER — LACTATED RINGERS IR SOLN
Status: DC | PRN
  Administered 2015-10-09: 200 mL

## 2015-10-09 MED ORDER — SCOPOLAMINE 1 MG/3DAYS TD PT72
MEDICATED_PATCH | TRANSDERMAL | Status: AC
  Administered 2015-10-09: 1.5 mg via TRANSDERMAL
  Filled 2015-10-09: qty 1

## 2015-10-09 MED ORDER — FENTANYL CITRATE (PF) 100 MCG/2ML IJ SOLN
INTRAMUSCULAR | Status: DC | PRN
  Administered 2015-10-09 (×4): 50 ug via INTRAVENOUS

## 2015-10-09 MED ORDER — BUPIVACAINE HCL (PF) 0.25 % IJ SOLN
INTRAMUSCULAR | Status: DC | PRN
  Administered 2015-10-09: 10 mL

## 2015-10-09 MED ORDER — SCOPOLAMINE 1 MG/3DAYS TD PT72
1.0000 | MEDICATED_PATCH | Freq: Once | TRANSDERMAL | Status: DC
  Administered 2015-10-09: 1.5 mg via TRANSDERMAL

## 2015-10-09 MED ORDER — HYDROCODONE-ACETAMINOPHEN 7.5-325 MG PO TABS
ORAL_TABLET | ORAL | Status: AC
  Filled 2015-10-09: qty 1

## 2015-10-09 MED ORDER — ONDANSETRON HCL 4 MG/2ML IJ SOLN: 4.0000 mg | Freq: Once | INTRAMUSCULAR | Status: DC | PRN

## 2015-10-09 MED ORDER — PROPOFOL 10 MG/ML IV BOLUS
INTRAVENOUS | Status: AC
  Filled 2015-10-09: qty 20

## 2015-10-09 MED ORDER — DEXAMETHASONE SODIUM PHOSPHATE 4 MG/ML IJ SOLN
INTRAMUSCULAR | Status: AC
  Filled 2015-10-09: qty 1

## 2015-10-09 MED ORDER — HYDROCODONE-ACETAMINOPHEN 5-325 MG PO TABS: 1.0000 | ORAL_TABLET | Freq: Four times a day (QID) | ORAL | Status: DC | PRN

## 2015-10-09 MED ORDER — MEPERIDINE HCL 25 MG/ML IJ SOLN: 6.2500 mg | INTRAMUSCULAR | Status: DC | PRN

## 2015-10-09 MED ORDER — LIDOCAINE HCL (CARDIAC) 20 MG/ML IV SOLN
INTRAVENOUS | Status: DC | PRN
  Administered 2015-10-09: 70 mg via INTRAVENOUS
  Administered 2015-10-09: 30 mg via INTRAVENOUS

## 2015-10-09 MED ORDER — LIDOCAINE-EPINEPHRINE 1 %-1:100000 IJ SOLN
INTRAMUSCULAR | Status: AC
  Filled 2015-10-09: qty 1

## 2015-10-09 MED ORDER — MIDAZOLAM HCL 2 MG/2ML IJ SOLN
INTRAMUSCULAR | Status: AC
  Filled 2015-10-09: qty 2

## 2015-10-09 MED ORDER — KETOROLAC TROMETHAMINE 30 MG/ML IJ SOLN
INTRAMUSCULAR | Status: AC
  Filled 2015-10-09: qty 1

## 2015-10-09 MED ORDER — ONDANSETRON HCL 4 MG/2ML IJ SOLN
INTRAMUSCULAR | Status: DC | PRN
  Administered 2015-10-09: 4 mg via INTRAVENOUS

## 2015-10-09 MED ORDER — METHYLENE BLUE 0.5 % INJ SOLN
INTRAVENOUS | Status: DC | PRN
  Administered 2015-10-09: 1 mL

## 2015-10-09 MED ORDER — ROCURONIUM BROMIDE 100 MG/10ML IV SOLN
INTRAVENOUS | Status: AC
  Filled 2015-10-09: qty 1

## 2015-10-09 MED ORDER — LIDOCAINE HCL (CARDIAC) 20 MG/ML IV SOLN
INTRAVENOUS | Status: AC
  Filled 2015-10-09: qty 5

## 2015-10-09 MED ORDER — ONDANSETRON HCL 4 MG/2ML IJ SOLN
INTRAMUSCULAR | Status: AC
  Filled 2015-10-09: qty 2

## 2015-10-09 MED ORDER — DEXTROSE 5 % IV SOLN: 2.0000 g | INTRAVENOUS | Status: DC

## 2015-10-09 MED ORDER — FENTANYL CITRATE (PF) 250 MCG/5ML IJ SOLN
INTRAMUSCULAR | Status: AC
  Filled 2015-10-09: qty 5

## 2015-10-09 MED ORDER — METHYLENE BLUE 1 % INJ SOLN
INTRAMUSCULAR | Status: AC
  Filled 2015-10-09: qty 1

## 2015-10-09 MED ORDER — FENTANYL CITRATE (PF) 100 MCG/2ML IJ SOLN: 25.0000 ug | INTRAMUSCULAR | Status: DC | PRN

## 2015-10-09 MED ORDER — GLYCOPYRROLATE 0.2 MG/ML IJ SOLN
INTRAMUSCULAR | Status: DC | PRN
  Administered 2015-10-09: 0.6 mg via INTRAVENOUS

## 2015-10-09 MED ORDER — NEOSTIGMINE METHYLSULFATE 10 MG/10ML IV SOLN
INTRAVENOUS | Status: DC | PRN
  Administered 2015-10-09: 4 mg via INTRAVENOUS

## 2015-10-09 MED ORDER — HYDROCODONE-ACETAMINOPHEN 7.5-325 MG PO TABS
1.0000 | ORAL_TABLET | Freq: Once | ORAL | Status: AC | PRN
  Administered 2015-10-09: 1 via ORAL

## 2015-10-09 SURGICAL SUPPLY — 37 items
CABLE HIGH FREQUENCY MONO STRZ (ELECTRODE) IMPLANT
CANISTER SUCT 3000ML (MISCELLANEOUS) ×2 IMPLANT
CATH ROBINSON RED A/P 16FR (CATHETERS) ×2 IMPLANT
CLOTH BEACON ORANGE TIMEOUT ST (SAFETY) ×2 IMPLANT
CONTAINER PREFILL 10% NBF 60ML (FORM) ×4 IMPLANT
DRSG COVADERM PLUS 2X2 (GAUZE/BANDAGES/DRESSINGS) ×4 IMPLANT
DRSG OPSITE POSTOP 3X4 (GAUZE/BANDAGES/DRESSINGS) ×1 IMPLANT
ELECT REM PT RETURN 9FT ADLT (ELECTROSURGICAL) ×2
ELECTRODE REM PT RTRN 9FT ADLT (ELECTROSURGICAL) IMPLANT
GLOVE BIO SURGEON STRL SZ8 (GLOVE) ×2 IMPLANT
GLOVE BIOGEL PI IND STRL 7.0 (GLOVE) ×1 IMPLANT
GLOVE BIOGEL PI INDICATOR 7.0 (GLOVE) ×1
GLOVE SURG ORTHO 8.0 STRL STRW (GLOVE) ×2 IMPLANT
GOWN STRL REUS W/TWL LRG LVL3 (GOWN DISPOSABLE) ×4 IMPLANT
LIGASURE LAP L-HOOKWIRE 5 44CM (INSTRUMENTS) IMPLANT
LIQUID BAND (GAUZE/BANDAGES/DRESSINGS) ×1 IMPLANT
LOOP ANGLED CUTTING 22FR (CUTTING LOOP) IMPLANT
NEEDLE INSUFFLATION 120MM (ENDOMECHANICALS) ×2 IMPLANT
NS IRRIG 1000ML POUR BTL (IV SOLUTION) ×2 IMPLANT
PACK LAPAROSCOPY BASIN (CUSTOM PROCEDURE TRAY) ×2 IMPLANT
PACK VAGINAL MINOR WOMEN LF (CUSTOM PROCEDURE TRAY) ×2 IMPLANT
PAD OB MATERNITY 4.3X12.25 (PERSONAL CARE ITEMS) ×2 IMPLANT
PAD TRENDELENBURG POSITION (MISCELLANEOUS) ×2 IMPLANT
SET IRRIG TUBING LAPAROSCOPIC (IRRIGATION / IRRIGATOR) ×1 IMPLANT
SLEEVE XCEL OPT CAN 5 100 (ENDOMECHANICALS) ×2 IMPLANT
SOLUTION ELECTROLUBE (MISCELLANEOUS) ×2 IMPLANT
SUT VICRYL 0 UR6 27IN ABS (SUTURE) ×2 IMPLANT
SUT VICRYL RAPIDE 3 0 (SUTURE) ×2 IMPLANT
SYR 50ML LL SCALE MARK (SYRINGE) ×1 IMPLANT
SYR TB 1ML 25GX5/8 (SYRINGE) ×1 IMPLANT
TOWEL OR 17X24 6PK STRL BLUE (TOWEL DISPOSABLE) ×4 IMPLANT
TROCAR OPTI TIP 5M 100M (ENDOMECHANICALS) ×2 IMPLANT
TROCAR XCEL DIL TIP R 11M (ENDOMECHANICALS) ×2 IMPLANT
TUBING AQUILEX INFLOW (TUBING) ×2 IMPLANT
TUBING AQUILEX OUTFLOW (TUBING) ×2 IMPLANT
WARMER LAPAROSCOPE (MISCELLANEOUS) ×2 IMPLANT
WATER STERILE IRR 1000ML POUR (IV SOLUTION) ×2 IMPLANT

## 2015-10-09 NOTE — Transfer of Care (Signed)
Immediate Anesthesia Transfer of Care Note  Patient: Anita [name]  Procedure(s) Performed: Procedure(s): LAPAROSCOPY DIAGNOSTIC with CO 2 laser  WITH CHROMOPERTUBATION AND LASER OF ENDOMETRIOSIS WITH LYSIS OF ADHESIONS (N/A) DILATATION AND CURETTAGE /HYSTEROSCOPY  (N/A)  Patient Location: PACU  Anesthesia Type:General  Level of Consciousness: awake, alert , oriented and patient cooperative  Airway & Oxygen Therapy: Patient Spontanous Breathing and Patient connected to nasal cannula oxygen  Post-op Assessment: Report given to RN and Post -op Vital signs reviewed and stable  Post vital signs: Reviewed and stable  Last Vitals:  Filed Vitals:   10/09/15 0600  BP: 121/94  Pulse: 73  Temp: 36.7 C  Resp: 20    Complications: No apparent anesthesia complications

## 2015-10-09 NOTE — Brief Op Note (Signed)
10/09/2015  8:30 AM  PATIENT:  Anita [name]  35 y.o. female  PRE-OPERATIVE DIAGNOSIS:  dysmenorrhea, pelvic pain MENORRHAGIA  POST-OPERATIVE DIAGNOSIS:  dysmenorrhea, pelvic pain MENORRHAGIA, minimal endometriosis, pelvic adhesions, endometrial polyp  PROCEDURE:  Procedure(s): LAPAROSCOPY DIAGNOSTIC with CO 2 laser  WITH CHROMOPERTUBATION AND LASER OF ENDOMETRIOSIS WITH LYSIS OF ADHESIONS (N/A) DILATATION AND CURETTAGE /HYSTEROSCOPY  (N/A)  SURGEON:  Surgeon(s) and Role:    * Louretta Shorten, MD - Primary  PHYSICIAN ASSISTANT:   ASSISTANTS: none   ANESTHESIA:   general  EBL:  Total I/O In: 1000 [I.V.:1000] Out: 100 [Urine:100]  BLOOD ADMINISTERED:none  DRAINS: none   LOCAL MEDICATIONS USED:  MARCAINE     SPECIMEN:  Source of Specimen:  endometrial polyp  DISPOSITION OF SPECIMEN:  PATHOLOGY  COUNTS:  YES  TOURNIQUET:  * No tourniquets in log *  DICTATION: .Other Dictation: Dictation Number 1  PLAN OF CARE: Discharge to home after PACU  PATIENT DISPOSITION:  PACU - hemodynamically stable.   Delay start of Pharmacological VTE agent (>24hrs) due to surgical blood loss or risk of bleeding: not applicable

## 2015-10-09 NOTE — Anesthesia Procedure Notes (Signed)
Procedure Name: Intubation Date/Time: 10/09/2015 7:37 AM Performed by: Tobin Chad Pre-anesthesia Checklist: Patient identified, Emergency Drugs available, Suction available, Patient being monitored and Timeout performed Patient Re-evaluated:Patient Re-evaluated prior to inductionOxygen Delivery Method: Circle system utilized and Simple face mask Preoxygenation: Pre-oxygenation with 100% oxygen Intubation Type: IV induction and Inhalational induction Laryngoscope Size: Mac and 3 Grade View: Grade II Tube size: 7.0 mm Number of attempts: 1 Secured at: 21 cm Tube secured with: Tape Dental Injury: Teeth and Oropharynx as per pre-operative assessment

## 2015-10-09 NOTE — Op Note (Signed)
NAMEROBBYN, NYLIN                  ACCOUNT NO.:  [id number]  MEDICAL RECORD NO.:  [id number]  LOCATION:  [facility]                          FACILITY:  [facility]  PHYSICIAN:  Monia Sabal. Corinna Capra, M.D.    DATE OF BIRTH:  05/10/1981  DATE OF PROCEDURE:  10/09/2015 DATE OF DISCHARGE:                              OPERATIVE REPORT   PREOPERATIVE DIAGNOSES:  Pelvic pain, dyspareunia, dysmenorrhea, and menorrhagia.  POSTOPERATIVE DIAGNOSES:  Pelvic pain, dyspareunia, dysmenorrhea, menorrhagia, minimal endometriosis, pelvic adhesions, and endometrial polyp.  PROCEDURE:  Laparoscopy with CO2 laser ablation of endometriosis implants, lysis of adhesions, chromopertubation, hysteroscopy and dilation and curettage with polypectomy.  SURGEON:  Monia Sabal. Corinna Capra, M.D.  ANESTHESIA:  General endotracheal.  INDICATIONS:  Ms. Anita [name] is a 35 year old, nulligravida who has been seen in my office for worsening dysmenorrhea, pelvic pain, and dyspareunia. She has also been having problems with menorrhagia to the point that she is passing large clots.  She is hoping to conceive so she does not want to do oral contraceptive agents.  At this time, she wants to proceed with further surgical evaluation.  We discussed the plan of care of the procedure, the risks and benefits, pros and cons.  She does give informed consent and wished to proceed with surgery.  Minimal endometriosis with implants below the left tube and ovary,  also on the back side of the right ovary and on the bladder.  She had good bilateral spill with chromopertubation, otherwise normal-appearing uterus, fallopian tubes, ovaries, cul-de-sac, normal-appearing appendix, liver and gallbladder.  On hysteroscopy, she did have a right posterior wall small endometrial polyp, otherwise normal-appearing endometrial cavity, normal-appearing ostia and cervix.  DESCRIPTION OF PROCEDURE:  After adequate analgesia, the patient was placed in the dorsal lithotomy position.   She was sterilely prepped and draped.  Bladder sterilely drained.  Graves speculum was placed.  A tenaculum was placed on the anterior lip of the cervix and a Cohen tenaculum was then placed.  A 1-cm infraumbilical skin incision was made.  Veress needle was inserted.  The abdomen was insufflated with dullness to percussion.  An 11-mm trocar was inserted.  The above findings were noted by laparoscope.  A 5-mm trocar was inserted left of the midline 2 fingerbreadths above the pubic symphysis.  After careful systematic evaluation of the abdomen and pelvis, a CO2 laser was attached with wattage at 5 watts carefully ablated the endometriosis implants along the left pelvic sidewall underneath the left ovary and the ovarian fossa and also on the back wall of the ovarian fossa.  The right ovary was adhered to the pelvic sidewall from endometriosis.  This also was lysed using CO2 laser ablated endometriosis implants and freeing the right ovary from the side wall.  Care was taken to avoid the underlying vessels and also underlying ureter, and the ureter was identified away from the dissection line on both sides with good peristalsis noted bilaterally.  The endometriosis implants along the left bladder wall was ablated using CO2 laser with good cautery effect with no evidence of residual endometriosis.  After careful systematic evaluation and no further endometriosis or adhesions noted, a chromopertubation was performed with good  bilateral spill.  The abdomen was then desufflated and trocars removed.  The infraumbilical skin incision was closed with 0 Vicryl interrupted suture, the fascia with 3- 0 Vicryl Rapide subcuticular suture.  The 5-mm site was closed with 3-0 Vicryl Rapide subcuticular suture and Dermabond.  Incisions were injected with 0.25% Marcaine, total of 10 mL used.  The legs were repositioned.  Speculum placed, the uterus was sounded to 8 cm, easily dilated to a #23 Pratt dilator.   Hysteroscope was inserted, the above findings were noted.  Gentle sharp curettage was carried out on the left sidewall where the endometrial polyp was removing the small fragment. Re-examination with the hysteroscope revealed complete removal of the endometrial polyp and normal-appearing endometrial cavity, ostia and cervix.  The hysteroscope was then removed.  Tenaculum removed from the cervix and noted to be hemostatic.  The patient then transferred to recovery room in stable condition.  Sponge and instrument counts were normal x3.  Estimated blood loss was minimal.  Saline deficit was 100 mL.  The patient received 1 g of cefotetan preoperatively and 30 mg of Toradol intraoperatively.  The patient will be discharged home with followup in the office in 2-3 weeks.  Sent home with a routine instruction sheet for laparoscopy, told to return for increased pain, fever, or bleeding.  Also given a prescription for Vicodin 5/300 mg #20.     Monia Sabal Corinna Capra, M.D.     DCL/MEDQ  D:  10/09/2015  T:  10/09/2015  Job:  [id number]

## 2015-10-09 NOTE — Anesthesia Postprocedure Evaluation (Signed)
Anesthesia Post Note  Patient: Anita [name]  Procedure(s) Performed: Procedure(s) (LRB): LAPAROSCOPY DIAGNOSTIC with CO 2 laser  WITH CHROMOPERTUBATION AND LASER OF ENDOMETRIOSIS WITH LYSIS OF ADHESIONS (N/A) DILATATION AND CURETTAGE /HYSTEROSCOPY  (N/A)  Patient location during evaluation: PACU Anesthesia Type: General Level of consciousness: awake and alert and oriented Pain management: pain level controlled Vital Signs Assessment: post-procedure vital signs reviewed and stable Respiratory status: spontaneous breathing, nonlabored ventilation and respiratory function stable Cardiovascular status: blood pressure returned to baseline and stable Anesthetic complications: no    Last Vitals:  Filed Vitals:   10/09/15 0930 10/09/15 0945  BP:  104/67  Pulse:  52  Temp:    Resp: 16 16    Last Pain:  Filed Vitals:   10/09/15 0952  PainSc: Asleep                 Morse Brueggemann A.

## 2015-10-09 NOTE — H&P (Addendum)
  Tyteanna EVANS [id number] 10/02/2015 S:  Anita [name] presents for evaluation of worsening dysmenorrhea, pelvic pain and dyspareunia.  She also has been having problems with menorrhagia to the point that she is passing large clots.  Hoping to get pregnant.  She does not want to do oral contraceptives as a treatment option.  She does have a seizure disorder and is followed by a neurologist, currently on Lamictal.   O:  Physical exam:  Heart regular rate and rhythm.  Lungs are clear to auscultation bilaterally.  Abdomen is nondistended, nontender.  Pelvic exam:  Uterus is anteverted, mobile and nontender.   A/P:  1) Pelvic pain and dyspareunia, dysmenorrhea, menorrhagia.  Discussed different options for Anita [name].  Plan laparoscopic evaluation for the pain.  Possible laser ablation of endometriosis or adhesions if noted.  Recommend chromopertubation patient at that time.  The patient also desires evaluation of the menorrhagia.  Plan hysteroscopic evaluation, possible D and C.  Risks and benefits and pros and cons were discussed at length.  Informed consent was obtained.        Louretta Shorten, MD   This patient has been seen and examined.   All of her questions were answered.  Labs and vital signs reviewed.  Informed consent has been obtained.  The History and Physical is current. 10/09/15 0715 DL

## 2015-10-11 ENCOUNTER — Encounter (HOSPITAL_COMMUNITY): Payer: Self-pay | Admitting: Obstetrics and Gynecology

## 2015-10-27 ENCOUNTER — Other Ambulatory Visit (INDEPENDENT_AMBULATORY_CARE_PROVIDER_SITE_OTHER): Payer: Self-pay

## 2015-10-27 DIAGNOSIS — G40309 Generalized idiopathic epilepsy and epileptic syndromes, not intractable, without status epilepticus: Secondary | ICD-10-CM

## 2015-10-27 DIAGNOSIS — Z0289 Encounter for other administrative examinations: Secondary | ICD-10-CM

## 2015-10-27 DIAGNOSIS — Z5181 Encounter for therapeutic drug level monitoring: Secondary | ICD-10-CM

## 2015-10-28 LAB — LAMOTRIGINE LEVEL: LAMOTRIGINE LVL: 7.5 ug/mL (ref 2.0–20.0)

## 2015-10-28 NOTE — Telephone Encounter (Signed)
error 

## 2015-10-29 ENCOUNTER — Telehealth: Payer: Self-pay | Admitting: Nurse Practitioner

## 2015-10-29 NOTE — Telephone Encounter (Signed)
-----   Message from [name], NP sent at 10/28/2015  5:24 PM EST ----- Good level of Lamictal please call

## 2015-10-29 NOTE — Telephone Encounter (Signed)
Called and left message asking for patient to call me back. If patient calls back back . Please relay to her that her Lamictal level was good. I relayed to patient I will be out of the office 10/29/2014.

## 2015-10-29 NOTE — Telephone Encounter (Signed)
Noted Thanks Dana  °

## 2015-10-29 NOTE — Telephone Encounter (Signed)
Pt returned Dana's call. Message relayed.

## 2015-11-03 ENCOUNTER — Telehealth: Payer: Self-pay | Admitting: Nurse Practitioner

## 2015-11-03 MED FILL — VIMPAT 200 MG TABLET: 200 | 30 days supply | Qty: 30 | Fill #2

## 2015-11-03 MED FILL — LaMICtal 200 MG TABS: 200 | 30 days supply | Qty: 90 | Fill #1

## 2015-11-03 NOTE — Progress Notes (Signed)
Quick Note:  Called and spoke to patient relayed Lamictal level was good . Patient understood. ______

## 2015-11-03 NOTE — Telephone Encounter (Signed)
-----   Message from [name], NP sent at 10/28/2015  5:24 PM EST ----- Good level of Lamictal please call

## 2015-11-03 NOTE — Telephone Encounter (Signed)
Called and spoke to patient relayed Lamictal level was good patient understood.

## 2015-11-18 ENCOUNTER — Encounter: Payer: Self-pay | Admitting: Nurse Practitioner

## 2015-11-18 ENCOUNTER — Ambulatory Visit (INDEPENDENT_AMBULATORY_CARE_PROVIDER_SITE_OTHER): Payer: 59 | Admitting: Nurse Practitioner

## 2015-11-18 VITALS — BP 105/70 | HR 76 | Ht 67.0 in | Wt 175.0 lb

## 2015-11-18 DIAGNOSIS — G40309 Generalized idiopathic epilepsy and epileptic syndromes, not intractable, without status epilepticus: Secondary | ICD-10-CM | POA: Diagnosis not present

## 2015-11-18 DIAGNOSIS — Z5181 Encounter for therapeutic drug level monitoring: Secondary | ICD-10-CM

## 2015-11-18 MED ORDER — LAMICTAL 200 MG PO TABS: 300.0000 mg | ORAL_TABLET | Freq: Two times a day (BID) | ORAL | Status: DC

## 2015-11-18 NOTE — Progress Notes (Signed)
[facility]  PATIENT: Anita [name] DOB: Aug 24, 1981   REASON FOR VISIT: Follow-up for generalized epilepsy HISTORY FROM: Patient    HISTORY OF PRESENT ILLNESS:Ms. Anita [name], 35 year old female returns for followup. She was last seen in this office 05/13/15.  She has a history of generalized seizure disorder with last seizure occurring Jan 23, 2015 after stopping her Klonopin abruptly for 1 week. She is trying to get pregnant and wanted to be off of the drug.  She has now tapered off of the drug over several months. She is currently on Vimpat 200 mg at bedtime and Lamictal 300 mg in the morning and 300 at night. She continues to work full time. She reports 2-3 times a week having an episode of staring spells lasting about 5 seconds. Recent trough level of Lamictal was 7.5. She has not had recent  trough level of Vimpat. She has had no other new neurologic complaints. She returns for reevaluation    HISTORY: generalized seizure disorder. Date of last seizure 08/24/2012 when she hit her head on the table after having a seizure. CT of the head was normal. She denies further staring spells, confusion, sleep disturbances, lapses of time, headache and bowel and bladder incontinence. She continues to work full-time at [employer] . She did not get her Lamictal level after last visit. She is not driving.   REVIEW OF SYSTEMS: Full 14 system review of systems performed and notable only for those listed, all others are neg:  Constitutional: neg  Cardiovascular: neg Ear/Nose/Throat: neg  Skin: neg Eyes: neg Respiratory: neg Gastroitestinal: neg  Hematology/Lymphatic: neg  Endocrine: neg Musculoskeletal:neg Allergy/Immunology: neg Neurological: neg Psychiatric: neg Sleep : neg   ALLERGIES: Allergies  Allergen Reactions  . Dye Fdc Red [Dye Fdc Red 3 (Erythrosine)] Nausea And Vomiting  . Flagyl [Metronidazole Hcl]     GI upset  . Morphine And Related Nausea And  Vomiting    HOME MEDICATIONS: Outpatient Prescriptions Prior to Visit  Medication Sig Dispense Refill  . LAMICTAL 200 MG tablet Take 1.5 tablets (300 mg total) by mouth 2 (two) times daily. TAKE 1.5 TABLETS BID 270 tablet 1  . VIMPAT 200 MG TABS tablet TAKE 1 TABLET BY MOUTH ONCE DAILY AT BEDTIME 90 tablet 0  . HYDROcodone-acetaminophen (NORCO) 5-325 MG tablet Take 1-2 tablets by mouth every 6 (six) hours as needed for moderate pain. 20 tablet 0   No facility-administered medications prior to visit.    PAST MEDICAL HISTORY: Past Medical History  Diagnosis Date  . Seizures ([place])   . Anxiety   . Heart murmur     PAST SURGICAL HISTORY: Past Surgical History  Procedure Laterality Date  . Laparoscopy N/A 10/09/2015    Procedure: LAPAROSCOPY DIAGNOSTIC with CO 2 laser  WITH CHROMOPERTUBATION AND LASER OF ENDOMETRIOSIS WITH LYSIS OF ADHESIONS;  Surgeon: Louretta Shorten, MD;  Location: [facility];  Service: Gynecology;  Laterality: N/A;  . Hysteroscopy w/d&c N/A 10/09/2015    Procedure: DILATATION AND CURETTAGE /HYSTEROSCOPY ;  Surgeon: Louretta Shorten, MD;  Location: [facility];  Service: Gynecology;  Laterality: N/A;    FAMILY HISTORY: Family History  Problem Relation Age of Onset  . Healthy Mother   . Healthy Father     SOCIAL HISTORY: Social History   Social History  . Marital Status: Single    Spouse Name: N/A  . Number of Children: 0  . Years of Education: college   Occupational History  .  [place]  Social History Main Topics  . Smoking status: Never Smoker   . Smokeless tobacco: Never Used  . Alcohol Use: No  . Drug Use: No  . Sexual Activity: Yes   Other Topics Concern  . Not on file   Social History Narrative   Patient lives at home with her boyfriend. Patient works full time at [employer].   Education- College   Right handed.   Caffeine- coffee four times a week.   Trying to get pregnant.  05-13-15     PHYSICAL EXAM  Filed Vitals:   11/18/15 0822  BP: 105/70    Pulse: 76  Height: 5\' 7"  (1.702 m)  Weight: 175 lb (79.379 kg)   Body mass index is 27.4 kg/(m^2). Generalized: Well developed, in no acute distress  Head: normocephalic and atraumatic,. Oropharynx benign  Musculoskeletal: No deformity   Neurological examination   Mentation: Alert oriented to time, place, history taking. Follows all commands speech and language fluent  Cranial nerve II-XII: Pupils were equal round reactive to light extraocular movements were full, visual field were full on confrontational test. Facial sensation and strength were normal. hearing was intact to finger rubbing bilaterally. Uvula tongue midline. head turning and shoulder shrug were normal and symmetric.Tongue protrusion into cheek strength was normal. Motor: normal bulk and tone, full strength in the BUE, BLE, fine finger movements normal, no pronator drift. No focal weakness Coordination: finger-nose-finger, heel-to-shin bilaterally, no dysmetria Reflexes: 1+ upper lower and symmetric, plantar responses were flexor bilaterally. Gait and Station: Rising up from seated position without assistance, normal stance, moderate stride, good arm swing, smooth turning, able to perform tiptoe, and heel walking without difficulty. Tandem gait is steady  DIAGNOSTIC DATA (LABS, IMAGING, TESTING) - I reviewed patient records, labs, notes, testing and imaging myself where available.  Lab Results  Component Value Date   WBC 4.6 10/09/2015   HGB 13.7 10/09/2015   HCT 40.0 10/09/2015   MCV 88.7 10/09/2015   PLT 376 10/09/2015    ASSESSMENT AND PLAN 35 y.o. year old female has a past medical history of Seizures; here to follow-up. Last generalized seizure occurred in 01/23/2015 after stopping her Klonopin abruptly. She has now tapered off the Klonopin over 2 month period of time. She is wanting to get pregnant and was made aware she would be high risk because of seizure disorder. She also reports staring spells  lasting less than 5 seconds 2-3 times a week.  Continue Vimpat at current dose will check level Continue Lamictal 300mg  twice daily refilled for 1 month supply x 6.  Recent  trough level Lamictal 7.3 (3) weeks ago If she becomes pregnant we will need to check Lamictal levels every 3 months, she verbalizes understanding at this F/U in 6 months [name], [facility], [facility], [facility] [address], [place], [place] [address] [phone] n

## 2015-11-18 NOTE — Patient Instructions (Signed)
Continue Vimpat at current dose Will check Vimpat level Continue Lamictal 300mg  twice daily Trough 3 weeks ago of Lamictal  7.3 If she becomes pregnant we will need to check Lamictal levels every 3 months,  F/U in 6 months

## 2015-11-18 NOTE — Progress Notes (Signed)
I have reviewed and agreed above plan. 

## 2015-11-20 LAB — LACOSAMIDE: LACOSAMIDE: 3.2 ug/mL — AB (ref 5.0–10.0)

## 2015-11-30 MED FILL — LETROZOLE 2.5 MG TABLET: 2.5 | 5 days supply | Qty: 10 | Fill #0

## 2015-12-04 MED FILL — LaMICtal 200 MG TABS: 200 | 30 days supply | Qty: 90 | Fill #2

## 2015-12-07 ENCOUNTER — Other Ambulatory Visit: Payer: Self-pay | Admitting: *Deleted

## 2015-12-07 MED ORDER — LACOSAMIDE 200 MG PO TABS: 200.0000 mg | ORAL_TABLET | Freq: Every day | ORAL | Status: DC

## 2015-12-07 MED FILL — VIMPAT 200 MG TABLET: 200 | 90 days supply | Qty: 90 | Fill #0

## 2015-12-18 DIAGNOSIS — Z3149 Encounter for other procreative investigation and testing: Secondary | ICD-10-CM | POA: Diagnosis not present

## 2015-12-22 MED FILL — LETROZOLE 2.5 MG TABLET: 2.5 | 28 days supply | Qty: 15 | Fill #0

## 2016-01-04 MED FILL — LaMICtal 200 MG TABS: 200 | 30 days supply | Qty: 90 | Fill #3

## 2016-01-15 DIAGNOSIS — N979 Female infertility, unspecified: Secondary | ICD-10-CM | POA: Diagnosis not present

## 2016-01-26 MED FILL — LETROZOLE 2.5 MG TABLET: 2.5 | 5 days supply | Qty: 15 | Fill #0

## 2016-02-01 MED FILL — LaMICtal 200 MG TABS: 200 | 30 days supply | Qty: 90 | Fill #0

## 2016-02-22 MED FILL — LETROZOLE 2.5 MG TABLET: 2.5 | 5 days supply | Qty: 15 | Fill #0

## 2016-03-02 MED FILL — VIMPAT 200 MG TABLET: 200 | 90 days supply | Qty: 90 | Fill #1

## 2016-03-02 MED FILL — LaMICtal 200 MG TABS: 200 | 30 days supply | Qty: 90 | Fill #1

## 2016-03-15 ENCOUNTER — Other Ambulatory Visit: Payer: Self-pay | Admitting: Pediatrics

## 2016-03-15 DIAGNOSIS — H6091 Unspecified otitis externa, right ear: Secondary | ICD-10-CM

## 2016-03-15 MED ORDER — CIPROFLOXACIN-HYDROCORTISONE 0.2-1 % OT SUSP: 3.0000 [drp] | Freq: Two times a day (BID) | OTIC | Status: DC

## 2016-03-15 MED FILL — CIPRO HC OTIC SUSPENSION: 0.2-1 | 30 days supply | Qty: 10 | Fill #0

## 2016-03-31 ENCOUNTER — Other Ambulatory Visit: Payer: Self-pay | Admitting: Nurse Practitioner

## 2016-03-31 MED FILL — LaMICtal 200 MG TABS: 200 | 30 days supply | Qty: 90 | Fill #0

## 2016-04-05 DIAGNOSIS — H9042 Sensorineural hearing loss, unilateral, left ear, with unrestricted hearing on the contralateral side: Secondary | ICD-10-CM | POA: Diagnosis not present

## 2016-04-05 DIAGNOSIS — H9313 Tinnitus, bilateral: Secondary | ICD-10-CM | POA: Insufficient documentation

## 2016-04-05 DIAGNOSIS — H905 Unspecified sensorineural hearing loss: Secondary | ICD-10-CM | POA: Insufficient documentation

## 2016-04-13 ENCOUNTER — Other Ambulatory Visit (HOSPITAL_COMMUNITY): Payer: Self-pay | Admitting: Otolaryngology

## 2016-04-13 DIAGNOSIS — H905 Unspecified sensorineural hearing loss: Secondary | ICD-10-CM

## 2016-04-13 DIAGNOSIS — H9042 Sensorineural hearing loss, unilateral, left ear, with unrestricted hearing on the contralateral side: Secondary | ICD-10-CM

## 2016-04-18 ENCOUNTER — Ambulatory Visit (HOSPITAL_COMMUNITY): Payer: 59

## 2016-04-20 ENCOUNTER — Ambulatory Visit (HOSPITAL_COMMUNITY)
Admission: RE | Admit: 2016-04-20 | Discharge: 2016-04-20 | Disposition: A | Discharge status: Home / Self Care | Payer: 59 | Source: Ambulatory Visit | Attending: Otolaryngology | Admitting: Otolaryngology

## 2016-04-20 ENCOUNTER — Ambulatory Visit (HOSPITAL_COMMUNITY): Admission: RE | Admit: 2016-04-20 | Payer: 59 | Source: Ambulatory Visit

## 2016-04-20 DIAGNOSIS — H9313 Tinnitus, bilateral: Secondary | ICD-10-CM | POA: Insufficient documentation

## 2016-04-20 DIAGNOSIS — H9042 Sensorineural hearing loss, unilateral, left ear, with unrestricted hearing on the contralateral side: Secondary | ICD-10-CM | POA: Diagnosis not present

## 2016-04-20 DIAGNOSIS — H905 Unspecified sensorineural hearing loss: Secondary | ICD-10-CM

## 2016-04-20 DIAGNOSIS — H9192 Unspecified hearing loss, left ear: Secondary | ICD-10-CM | POA: Diagnosis not present

## 2016-04-20 MED ORDER — GADOBENATE DIMEGLUMINE 529 MG/ML IV SOLN
15.0000 mL | Freq: Once | INTRAVENOUS | Status: AC | PRN
  Administered 2016-04-20: 15 mL via INTRAVENOUS

## 2016-05-03 MED FILL — LaMICtal 200 MG TABS: 200 | 30 days supply | Qty: 90 | Fill #1

## 2016-05-25 ENCOUNTER — Encounter: Payer: Self-pay | Admitting: Nurse Practitioner

## 2016-05-25 ENCOUNTER — Ambulatory Visit (INDEPENDENT_AMBULATORY_CARE_PROVIDER_SITE_OTHER): Payer: 59 | Admitting: Nurse Practitioner

## 2016-05-25 VITALS — BP 109/76 | HR 72 | Ht 67.0 in | Wt 169.6 lb

## 2016-05-25 DIAGNOSIS — G40309 Generalized idiopathic epilepsy and epileptic syndromes, not intractable, without status epilepticus: Secondary | ICD-10-CM | POA: Diagnosis not present

## 2016-05-25 DIAGNOSIS — R569 Unspecified convulsions: Secondary | ICD-10-CM

## 2016-05-25 MED ORDER — LACOSAMIDE 200 MG PO TABS: 200.0000 mg | ORAL_TABLET | Freq: Every day | ORAL | 1 refills | Status: DC

## 2016-05-25 MED FILL — VIMPAT 200 MG TABLET: 200 | 90 days supply | Qty: 90 | Fill #0

## 2016-05-25 NOTE — Patient Instructions (Addendum)
Continue Vimpat at current dose Continue Lamictal 300mg  twice daily  Call for seizure activity F/U in 6 months

## 2016-05-25 NOTE — Progress Notes (Signed)
[facility]  PATIENT: EZME GLANCY DOB: 02-Jan-1981   REASON FOR VISIT: Follow-up for generalized epilepsy HISTORY FROM: Patient    HISTORY OF PRESENT ILLNESS:[name], 35 year old female returns for followup.   She has a history of generalized seizure disorder with last seizure occurring Jan 23, 2015 after stopping her Klonopin abruptly for 1 week. She is trying to get pregnant and wanted to be off of the drug.  She has now tapered off of the drug over several months. She is currently on Vimpat 200 mg at bedtime and Lamictal 300 mg in the morning and 300 at night. She continues to work full time. She reports no seizures since last seen She has had no other new neurologic complaints. She returns for reevaluation    HISTORY: generalized seizure disorder. Date of last seizure 08/24/2012 when she hit her head on the table after having a seizure. CT of the head was normal. She denies further staring spells, confusion, sleep disturbances, lapses of time, headache and bowel and bladder incontinence. She continues to work full-time at [employer] . She did not get her Lamictal level after last visit. She is not driving.   REVIEW OF SYSTEMS: Full 14 system review of systems performed and notable only for those listed, all others are neg:  Constitutional: neg  Cardiovascular: neg Ear/Nose/Throat: neg  Skin: neg Eyes: neg Respiratory: neg Gastroitestinal: neg  Hematology/Lymphatic: neg  Endocrine: neg Musculoskeletal:neg Allergy/Immunology: neg Neurological: neg Psychiatric: neg Sleep : neg   ALLERGIES: Allergies  Allergen Reactions  . Dye Fdc Red [Dye Fdc Red 3 (Erythrosine)] Nausea And Vomiting  . Flagyl [Metronidazole Hcl]     GI upset  . Morphine And Related Nausea And Vomiting    HOME MEDICATIONS: Outpatient Medications Prior to Visit  Medication Sig Dispense Refill  . lacosamide (VIMPAT) 200 MG TABS tablet Take 1 tablet (200 mg total) by  mouth at bedtime. 90 tablet 1  . LAMICTAL 200 MG tablet TAKE 1 & 1/2 TABLETS BY MOUTH TWICE DAILY 90 tablet 11  . ciprofloxacin-hydrocortisone (CIPRO HC OTIC) otic suspension Place 3 drops into the right ear 2 (two) times daily. 10 mL 0   No facility-administered medications prior to visit.     PAST MEDICAL HISTORY: Past Medical History:  Diagnosis Date  . Anxiety   . Heart murmur   . Seizures ([place])     PAST SURGICAL HISTORY: Past Surgical History:  Procedure Laterality Date  . HYSTEROSCOPY W/D&C N/A 10/09/2015   Procedure: DILATATION AND CURETTAGE /HYSTEROSCOPY ;  Surgeon: Louretta Shorten, MD;  Location: [facility];  Service: Gynecology;  Laterality: N/A;  . LAPAROSCOPY N/A 10/09/2015   Procedure: LAPAROSCOPY DIAGNOSTIC with CO 2 laser  WITH CHROMOPERTUBATION AND LASER OF ENDOMETRIOSIS WITH LYSIS OF ADHESIONS;  Surgeon: Louretta Shorten, MD;  Location: [facility];  Service: Gynecology;  Laterality: N/A;    FAMILY HISTORY: Family History  Problem Relation Age of Onset  . Healthy Mother   . Healthy Father     SOCIAL HISTORY: Social History   Social History  . Marital status: Single    Spouse name: N/A  . Number of children: 0  . Years of education: college   Occupational History  .  [place]   Social History Main Topics  . Smoking status: Never Smoker  . Smokeless tobacco: Never Used  . Alcohol use No  . Drug use: No  . Sexual activity: Yes   Other Topics Concern  . Not on file  Social History Narrative   Patient lives at home with her boyfriend. Patient works full time at [employer].   Education- College   Right handed.   Caffeine- coffee four times a week.   Trying to get pregnant.  05-13-15     PHYSICAL EXAM  Vitals:   05/25/16 0800  BP: 109/76  Pulse: 72  Weight: 169 lb 9.6 oz (76.9 kg)  Height: 5\' 7"  (1.702 m)   Body mass index is 26.56 kg/m. Generalized: Well developed, in no acute distress  Head: normocephalic and atraumatic,. Oropharynx benign   Musculoskeletal: No deformity   Neurological examination   Mentation: Alert oriented to time, place, history taking. Follows all commands speech and language fluent  Cranial nerve II-XII: Pupils were equal round reactive to light extraocular movements were full, visual field were full on confrontational test. Facial sensation and strength were normal. hearing was intact to finger rubbing bilaterally. Uvula tongue midline. head turning and shoulder shrug were normal and symmetric.Tongue protrusion into cheek strength was normal. Motor: normal bulk and tone, full strength in the BUE, BLE, fine finger movements normal, no pronator drift. No focal weakness Coordination: finger-nose-finger, heel-to-shin bilaterally, no dysmetria Reflexes: 1+ upper lower and symmetric, plantar responses were flexor bilaterally. Gait and Station: Rising up from seated position without assistance, normal stance, moderate stride, good arm swing, smooth turning, able to perform tiptoe, and heel walking without difficulty. Tandem gait is steady  DIAGNOSTIC DATA (LABS, IMAGING, TESTING) - I reviewed patient records, labs, notes, testing and imaging myself where available.  Lab Results  Component Value Date   WBC 4.6 10/09/2015   HGB 13.7 10/09/2015   HCT 40.0 10/09/2015   MCV 88.7 10/09/2015   PLT 376 10/09/2015    ASSESSMENT AND PLAN 35 y.o. year old female has a past medical history of Seizures; here to follow-up. Last generalized seizure occurred in 01/23/2015 after stopping her Klonopin abruptly. She has now tapered off the Klonopin over 2 month period of time. She is wanting to get pregnant and was made aware she would be high risk because of seizure disorder. She has an appt for IVF in November  PLAN: Continue Vimpat at current dose Continue Lamictal 300mg  twice daily  Call for seizure activity F/U in 6 months [name], [facility] [address], [facility], [facility] [address] [place],  [address] [phone]

## 2016-05-25 NOTE — Progress Notes (Signed)
Fax confirmation received for vimpat. [place] [facility]. ssy

## 2016-05-26 MED FILL — LaMICtal 200 MG TABS: 200 | 30 days supply | Qty: 90 | Fill #2

## 2016-06-29 MED FILL — LaMICtal 200 MG TABS: 200 | 30 days supply | Qty: 90 | Fill #3

## 2016-07-18 ENCOUNTER — Encounter: Payer: Self-pay | Admitting: Nurse Practitioner

## 2016-07-27 DIAGNOSIS — E288 Other ovarian dysfunction: Secondary | ICD-10-CM | POA: Diagnosis not present

## 2016-07-27 DIAGNOSIS — Z319 Encounter for procreative management, unspecified: Secondary | ICD-10-CM | POA: Diagnosis not present

## 2016-07-27 DIAGNOSIS — Z3161 Procreative counseling and advice using natural family planning: Secondary | ICD-10-CM | POA: Diagnosis not present

## 2016-07-27 DIAGNOSIS — Z31430 Encounter of female for testing for genetic disease carrier status for procreative management: Secondary | ICD-10-CM | POA: Diagnosis not present

## 2016-07-27 DIAGNOSIS — N978 Female infertility of other origin: Secondary | ICD-10-CM | POA: Diagnosis not present

## 2016-07-27 MED FILL — CLOMIPHENE CITRATE 50 MG TA: 50 | 5 days supply | Qty: 10 | Fill #0

## 2016-07-27 MED FILL — OVIDREL 250 MCG/0.5 ML SYRG: 250 | 1 days supply | Qty: 1 | Fill #0

## 2016-07-27 MED FILL — FOLIC ACID 1 MG TABLET: 1 | 30 days supply | Qty: 120 | Fill #0

## 2016-07-28 MED FILL — LaMICtal 200 MG TABS: 200 | 30 days supply | Qty: 90 | Fill #4

## 2016-07-29 DIAGNOSIS — Z319 Encounter for procreative management, unspecified: Secondary | ICD-10-CM | POA: Diagnosis not present

## 2016-08-09 ENCOUNTER — Other Ambulatory Visit: Payer: Self-pay | Admitting: Pediatrics

## 2016-08-09 DIAGNOSIS — H6001 Abscess of right external ear: Secondary | ICD-10-CM

## 2016-08-09 MED ORDER — CLINDAMYCIN HCL 300 MG PO CAPS: 300.0000 mg | ORAL_CAPSULE | Freq: Four times a day (QID) | ORAL | 1 refills | Status: DC

## 2016-08-09 MED FILL — CLINDAMYCIN HCL 300 MG CAPS: 300 | 7 days supply | Qty: 28 | Fill #0

## 2016-08-16 ENCOUNTER — Telehealth: Payer: Self-pay | Admitting: *Deleted

## 2016-08-16 NOTE — Telephone Encounter (Signed)
Received APS form from [place].  Completed, forwarded to Dr. Krista Blue for review and signature.  Anita [name] as seen pt last 3 times).

## 2016-08-17 DIAGNOSIS — Z319 Encounter for procreative management, unspecified: Secondary | ICD-10-CM | POA: Diagnosis not present

## 2016-08-18 DIAGNOSIS — Z0289 Encounter for other administrative examinations: Secondary | ICD-10-CM

## 2016-08-19 DIAGNOSIS — Z3189 Encounter for other procreative management: Secondary | ICD-10-CM | POA: Diagnosis not present

## 2016-08-23 MED FILL — VIMPAT 200 MG TABLET: 200 | 90 days supply | Qty: 90 | Fill #1

## 2016-08-23 MED FILL — LaMICtal 200 MG TABS: 200 | 30 days supply | Qty: 90 | Fill #5

## 2016-09-01 ENCOUNTER — Encounter: Payer: Self-pay | Admitting: Family Medicine

## 2016-09-07 MED FILL — CLOMIPHENE CITRATE 50 MG TA: 50 | 5 days supply | Qty: 10 | Fill #0

## 2016-09-12 IMAGING — MR MR HEAD WO/W CM
11 of 13 series · 27 of 48 positions shown · IV contrast (Yes)
Comparison: None.

CLINICAL DATA: Sensory hearing loss of the left ear.  Tinnitus.

EXAM:
MRI HEAD WITHOUT AND WITH CONTRAST
TECHNIQUE: Multiplanar, multiecho pulse sequences of the brain and surrounding
structures were obtained without and with intravenous contrast.
High-resolution T2 weighted imaging was performed through the level
of the internal auditory canals.
CONTRAST:  15mL MULTIHANCE GADOBENATE DIMEGLUMINE 529 MG/ML IV SOLN

[Series 3: T1 · sagittal · 5.0mm · 0.47mm/px · 3 of 24 slices shown (1 of 3)]
[im 1/24]
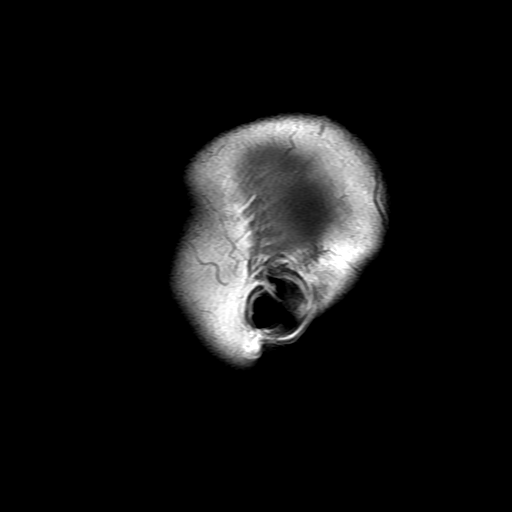
[im 12/24]
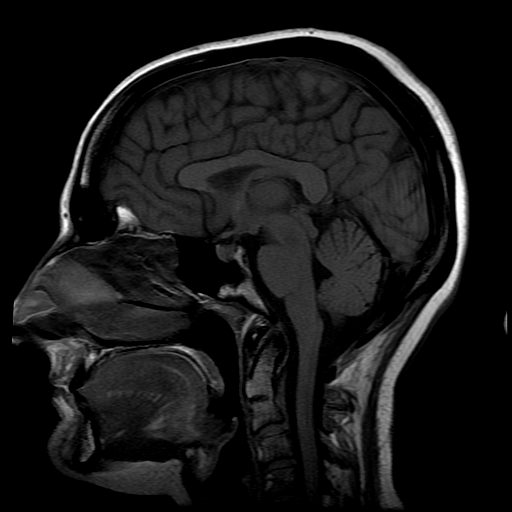
[im 24/24]
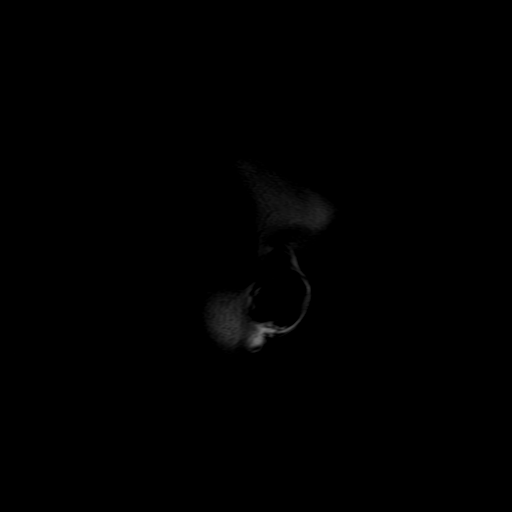

[Series 4: DWI · axial · 3.0mm · 1.09mm/px · z∈[-26,+120]mm · 8 of 100 slices shown (1 of 2)]
[im 1/100]
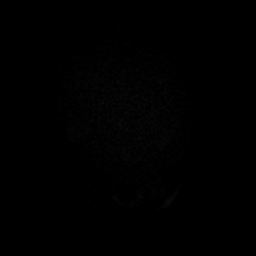
[im 12/100]
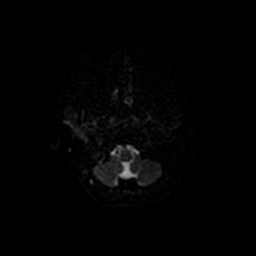
[im 34/100]
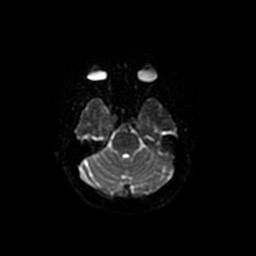
[im 45/100]
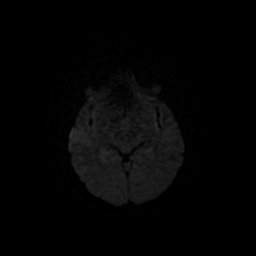
[im 56/100]
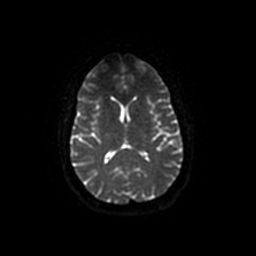
[im 67/100]
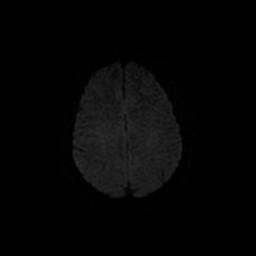
[im 89/100]
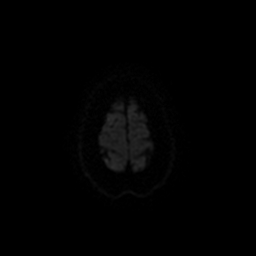
[im 100/100]
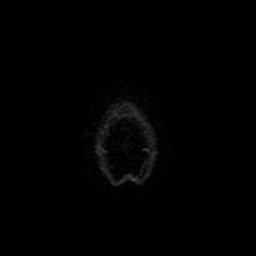

[Series 5: T2 · axial · 5.0mm · 0.43mm/px · z∈[-34,+117]mm · 3 of 23 slices shown]
[im 1/23]
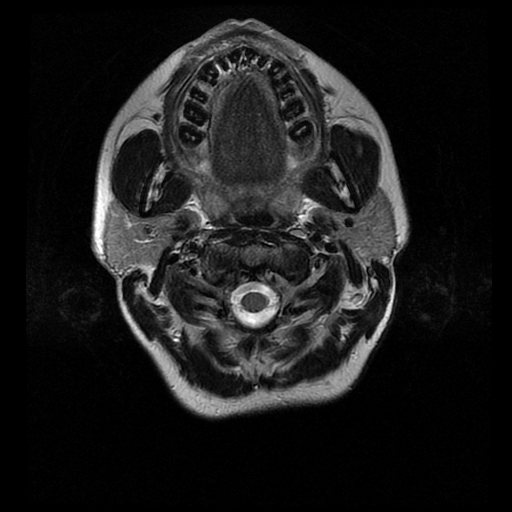
[im 12/23]
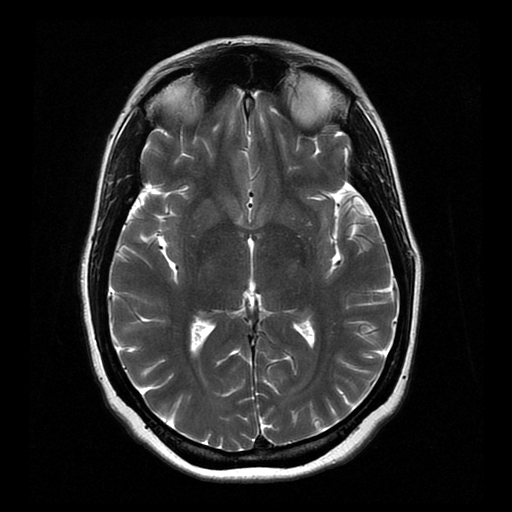
[im 23/23]
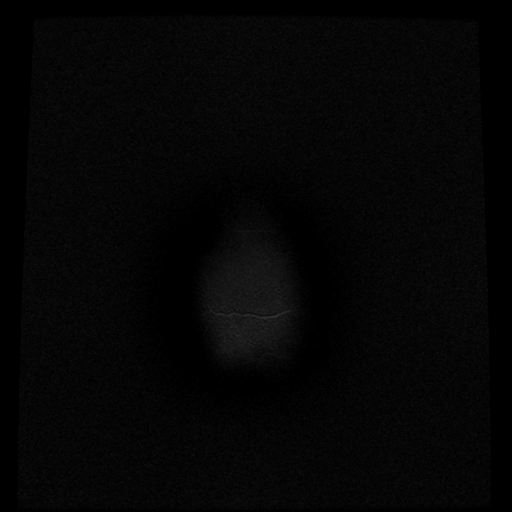

[Series 6: FLAIR · axial · 5.0mm · 0.43mm/px · z∈[-34,+117]mm · 2 of 23 slices shown]
[im 1/23]
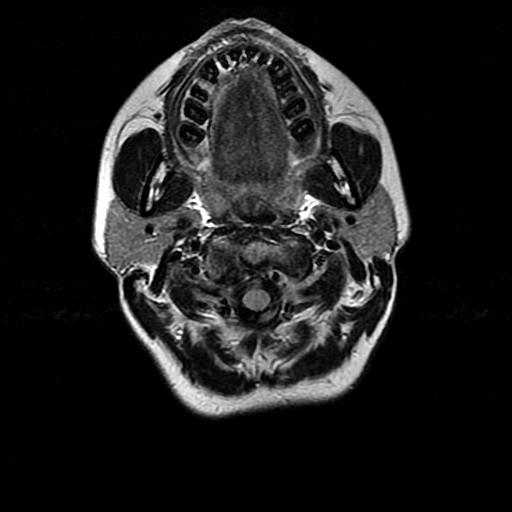
[im 23/23]
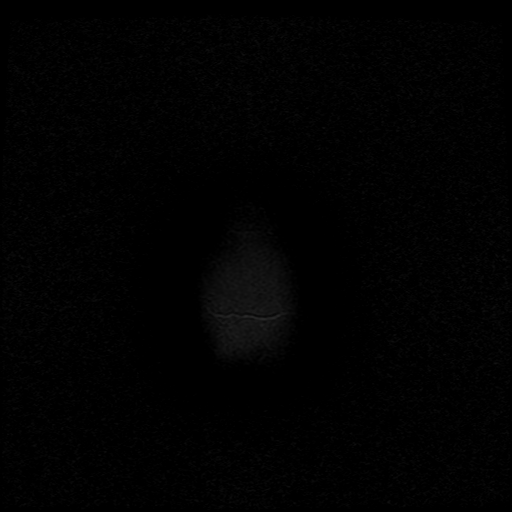

[Series 7: bSSFP · axial · 1.2mm · 0.35mm/px · 1 of 76 slices shown]
[im 1/76]
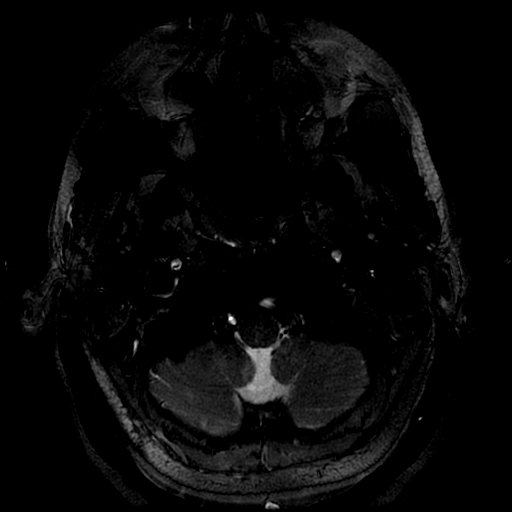

[Series 8: T1 · axial · 3.0mm · 0.35mm/px · 1 of 15 slices shown (2 of 3)]
[im 1/15]
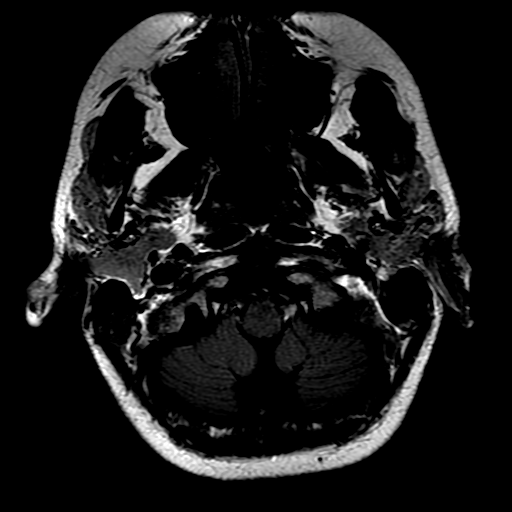

[Series 9: T1 · coronal · 3.0mm · 0.35mm/px · 1 of 16 slices shown (3 of 3)]
[im 1/16]
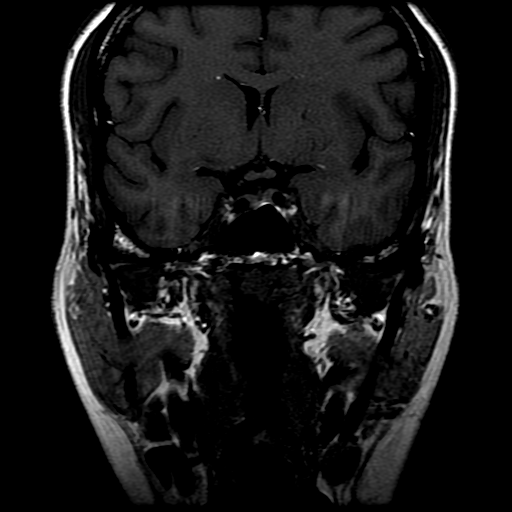

[Series 10: T2 post-contrast · coronal · 5.0mm · 0.45mm/px · 2 of 26 slices shown]
[im 1/26]
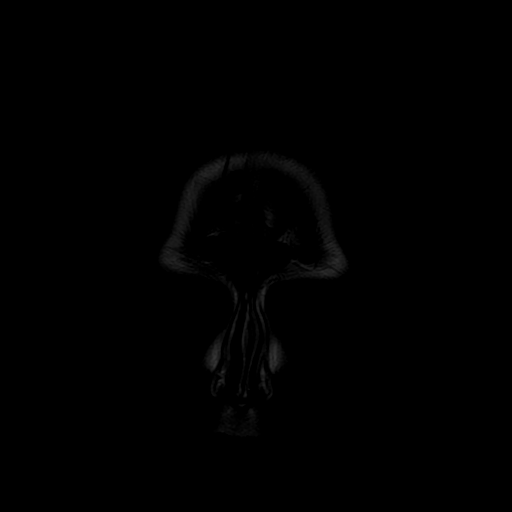
[im 26/26]
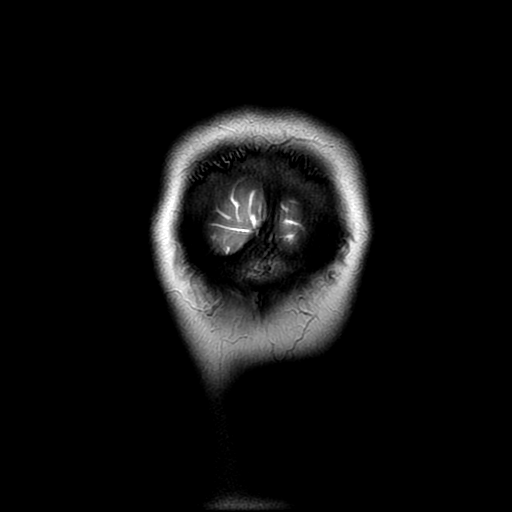

[Series 12: T1 post-contrast · axial · 3.0mm · 0.35mm/px · 1 of 15 slices shown (1 of 2)]
[im 1/15]
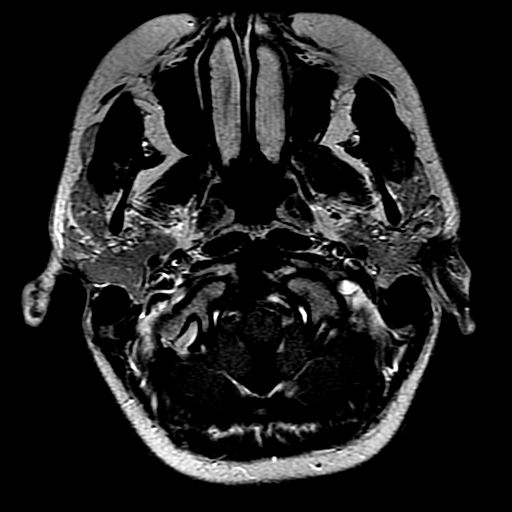

[Series 13: T1 post-contrast · coronal · 3.0mm · 0.35mm/px · 1 of 16 slices shown (2 of 2)]
[im 1/16]
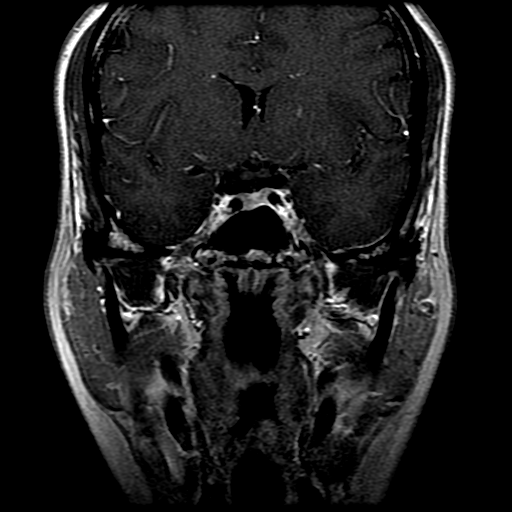

[Series 400: DWI · axial · 3.0mm · 1.09mm/px · z∈[-26,+120]mm · 4 of 50 slices shown (2 of 2)]
[im 1/50]
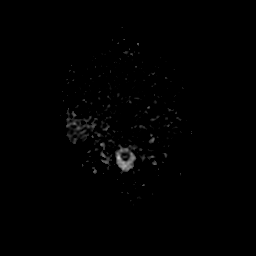
[im 17/50]
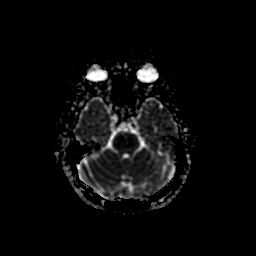
[im 33/50]
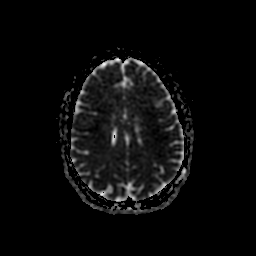
[im 50/50]
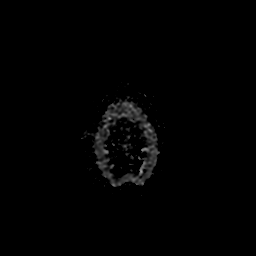

[27 of 48 positions shown; findings below may reference images not displayed]

FINDINGS: Brain Parenchyma: No acute infarct or intraparenchymal hemorrhage.
No focal parenchymal signal abnormality. No mass lesion or midline
shift. The major intracranial flow voids are preserved. The midline
structures are normal.

Internal Auditory Canals: There is no cerebellopontine angle mass.
The cochleae and semicircular canals are normal. No focal
abnormality along the course of the 7th and 8th cranial nerves.
Normal porus acusticus and vestibular aqueduct bilaterally.

Ventricles, Sulci and Extra-axial Spaces: Normal for age. No
extra-axial collection.

Paranasal Sinuses and Mastoids: No fluid levels or advanced mucosal
thickening.

Orbits: Normal.

Bones and Soft Tissues: The visualized skull base, calvarium and
extracranial soft tissues are normal.
IMPRESSION: Normal MRI of the brain and internal auditory canals.

## 2016-09-16 DIAGNOSIS — Z319 Encounter for procreative management, unspecified: Secondary | ICD-10-CM | POA: Diagnosis not present

## 2016-09-19 DIAGNOSIS — Z3189 Encounter for other procreative management: Secondary | ICD-10-CM | POA: Diagnosis not present

## 2016-09-22 MED FILL — LaMICtal 200 MG TABS: 200 | 30 days supply | Qty: 90 | Fill #6

## 2016-09-22 MED FILL — FOLIC ACID 1 MG TABLET: 1 | 30 days supply | Qty: 120 | Fill #1

## 2016-10-17 DIAGNOSIS — Z3141 Encounter for fertility testing: Secondary | ICD-10-CM | POA: Diagnosis not present

## 2016-10-17 DIAGNOSIS — Z319 Encounter for procreative management, unspecified: Secondary | ICD-10-CM | POA: Diagnosis not present

## 2016-10-17 DIAGNOSIS — Z113 Encounter for screening for infections with a predominantly sexual mode of transmission: Secondary | ICD-10-CM | POA: Diagnosis not present

## 2016-10-18 MED FILL — DOXYCYCLINE HYCLATE 100 MG: 100 | 5 days supply | Qty: 10 | Fill #0

## 2016-10-18 MED FILL — SYNAREL 2 MG/ML NASAL SPRAY: 2 | 28 days supply | Qty: 8 | Fill #0

## 2016-10-18 MED FILL — LaMICtal 200 MG TABS: 200 | 30 days supply | Qty: 90 | Fill #7

## 2016-11-10 DIAGNOSIS — Z319 Encounter for procreative management, unspecified: Secondary | ICD-10-CM | POA: Diagnosis not present

## 2016-11-16 MED FILL — LaMICtal 200 MG TABS: 200 | 30 days supply | Qty: 90 | Fill #8

## 2016-11-21 ENCOUNTER — Other Ambulatory Visit: Payer: Self-pay | Admitting: *Deleted

## 2016-11-21 NOTE — Telephone Encounter (Signed)
Spoke to pt.  She gets paid 12-01-16. Will reach out to billing.(has balance).  Needs appt next month.   She states has 2 tabs left.  Will refill for 30 days.  Will send to CM/NP

## 2016-11-22 MED ORDER — LACOSAMIDE 200 MG PO TABS: 200.0000 mg | ORAL_TABLET | Freq: Every day | ORAL | 0 refills | Status: DC

## 2016-11-22 MED FILL — VIMPAT 200 MG TABLET: 200 | 30 days supply | Qty: 30 | Fill #0

## 2016-11-22 NOTE — Telephone Encounter (Signed)
Fax confirmation received [place] [facility] [phone]. sy

## 2016-11-23 DIAGNOSIS — O0900 Supervision of pregnancy with history of infertility, unspecified trimester: Secondary | ICD-10-CM | POA: Diagnosis not present

## 2016-12-02 DIAGNOSIS — E288 Other ovarian dysfunction: Secondary | ICD-10-CM | POA: Diagnosis not present

## 2016-12-02 DIAGNOSIS — N978 Female infertility of other origin: Secondary | ICD-10-CM | POA: Diagnosis not present

## 2016-12-02 DIAGNOSIS — Z3183 Encounter for assisted reproductive fertility procedure cycle: Secondary | ICD-10-CM | POA: Diagnosis not present

## 2016-12-02 DIAGNOSIS — Z113 Encounter for screening for infections with a predominantly sexual mode of transmission: Secondary | ICD-10-CM | POA: Diagnosis not present

## 2016-12-06 MED FILL — FOLIC ACID 1 MG TABLET: 1 | 30 days supply | Qty: 120 | Fill #2

## 2016-12-07 DIAGNOSIS — Z3183 Encounter for assisted reproductive fertility procedure cycle: Secondary | ICD-10-CM | POA: Diagnosis not present

## 2016-12-07 DIAGNOSIS — N978 Female infertility of other origin: Secondary | ICD-10-CM | POA: Diagnosis not present

## 2016-12-09 DIAGNOSIS — N978 Female infertility of other origin: Secondary | ICD-10-CM | POA: Diagnosis not present

## 2016-12-09 DIAGNOSIS — Z3183 Encounter for assisted reproductive fertility procedure cycle: Secondary | ICD-10-CM | POA: Diagnosis not present

## 2016-12-12 DIAGNOSIS — Z3183 Encounter for assisted reproductive fertility procedure cycle: Secondary | ICD-10-CM | POA: Diagnosis not present

## 2016-12-12 DIAGNOSIS — N978 Female infertility of other origin: Secondary | ICD-10-CM | POA: Diagnosis not present

## 2016-12-13 DIAGNOSIS — Z3183 Encounter for assisted reproductive fertility procedure cycle: Secondary | ICD-10-CM | POA: Diagnosis not present

## 2016-12-13 DIAGNOSIS — N978 Female infertility of other origin: Secondary | ICD-10-CM | POA: Diagnosis not present

## 2016-12-14 DIAGNOSIS — E282 Polycystic ovarian syndrome: Secondary | ICD-10-CM | POA: Diagnosis not present

## 2016-12-14 MED FILL — OXYCODONE W/APAP 5/325 TAB: 5-325 | 2 days supply | Qty: 10 | Fill #0

## 2016-12-14 MED FILL — LaMICtal 200 MG TABS: 200 | 30 days supply | Qty: 90 | Fill #9

## 2016-12-14 MED FILL — PROMETHAZINE 12.5 MG TABLET: 12.5 | 2 days supply | Qty: 10 | Fill #0

## 2016-12-16 DIAGNOSIS — Z3183 Encounter for assisted reproductive fertility procedure cycle: Secondary | ICD-10-CM | POA: Diagnosis not present

## 2016-12-21 DIAGNOSIS — Z3183 Encounter for assisted reproductive fertility procedure cycle: Secondary | ICD-10-CM | POA: Diagnosis not present

## 2016-12-29 DIAGNOSIS — Z3200 Encounter for pregnancy test, result unknown: Secondary | ICD-10-CM | POA: Diagnosis not present

## 2017-01-02 ENCOUNTER — Telehealth: Payer: Self-pay | Admitting: Nurse Practitioner

## 2017-01-02 DIAGNOSIS — Z3200 Encounter for pregnancy test, result unknown: Secondary | ICD-10-CM | POA: Diagnosis not present

## 2017-01-02 MED ORDER — LACOSAMIDE 200 MG PO TABS: 200.0000 mg | ORAL_TABLET | Freq: Every day | ORAL | 5 refills | Status: DC

## 2017-01-02 MED FILL — VIMPAT 200 MG TABLET: 200 | 30 days supply | Qty: 30 | Fill #0

## 2017-01-02 NOTE — Telephone Encounter (Signed)
meds ordered as requested

## 2017-01-02 NOTE — Telephone Encounter (Signed)
Pt said she was asked to call to notify Hoyle Sauer of when she became preganant, she is officially pregnant and wants refills of her lacosamide (VIMPAT) 200 MG TABS tablet month to month, no longer in 90 day amounts.

## 2017-01-02 NOTE — Addendum Note (Signed)
Addended by: Otilio Jefferson on: 01/02/2017 01:19 PM   Modules accepted: Orders

## 2017-01-02 NOTE — Telephone Encounter (Signed)
Pt last seen 05-25-16.  Has upcoming appt 01-25-17 with CM/NP.   I LMVM for pt (CONGRATS) and checking to see how far along she was.  Will forward to CM/NP.

## 2017-01-02 NOTE — Telephone Encounter (Signed)
Fax confirmation received [facility] [phone].

## 2017-01-11 DIAGNOSIS — Z3200 Encounter for pregnancy test, result unknown: Secondary | ICD-10-CM | POA: Diagnosis not present

## 2017-01-16 MED FILL — LaMICtal 200 MG TABS: 200 | 30 days supply | Qty: 90 | Fill #10

## 2017-01-18 MED FILL — ONDANSETRON ODT 4 MG TABLET: 4 | 14 days supply | Qty: 28 | Fill #0

## 2017-01-18 MED FILL — FOLIC ACID 1 MG TABLET: 1 | 30 days supply | Qty: 30 | Fill #0

## 2017-01-24 MED FILL — FOLIC ACID 1 MG TABLET: 1 | 30 days supply | Qty: 240 | Fill #0

## 2017-01-25 ENCOUNTER — Encounter: Payer: Self-pay | Admitting: Nurse Practitioner

## 2017-01-25 ENCOUNTER — Ambulatory Visit (INDEPENDENT_AMBULATORY_CARE_PROVIDER_SITE_OTHER): Payer: 59 | Admitting: Nurse Practitioner

## 2017-01-25 VITALS — BP 109/73 | HR 71 | Ht 67.0 in | Wt 186.8 lb

## 2017-01-25 DIAGNOSIS — O0900 Supervision of pregnancy with history of infertility, unspecified trimester: Secondary | ICD-10-CM | POA: Diagnosis not present

## 2017-01-25 DIAGNOSIS — Z5181 Encounter for therapeutic drug level monitoring: Secondary | ICD-10-CM

## 2017-01-25 DIAGNOSIS — G40309 Generalized idiopathic epilepsy and epileptic syndromes, not intractable, without status epilepticus: Secondary | ICD-10-CM | POA: Diagnosis not present

## 2017-01-25 NOTE — Patient Instructions (Signed)
Continue Vimpat at current dose Continue Lamictal 300mg  twice daily  Will check level of Vimpat and Lamictal today pt is 2 months pregnant  Call for seizure activity F/U in 3  months

## 2017-01-25 NOTE — Progress Notes (Signed)
I have reviewed and agreed above plan. 

## 2017-01-25 NOTE — Progress Notes (Signed)
[facility]  PATIENT: Anita [name] DOB: 1980/10/08   REASON FOR VISIT: Follow-up for generalized epilepsy pregnant with twins 2 months HISTORY FROM: Patient    HISTORY OF PRESENT ILLNESS:Anita [name], 36 year old female returns for followup.   She has a history of generalized seizure disorder with last seizure occurring Jan 23, 2015 after stopping her Klonopin abruptly for 1 week. She is 2 months pregnant with twins. She is aware that she is a high risk pregnancy due to her seizure disorder  She is currently on Vimpat 200 mg at bedtime and Lamictal 300 mg in the morning and 300 at night. She continues to work full time. No other new neurologic complaints. She returns for reevaluation. She was made  aware that she will need to be seen every 3 months for labs and possible medication adjustments of her seizure medication.    HISTORY: generalized seizure disorder. Date of last seizure 08/24/2012 when she hit her head on the table after having a seizure. CT of the head was normal. She denies further staring spells, confusion, sleep disturbances, lapses of time, headache and bowel and bladder incontinence. She continues to work full-time at [employer] . She did not get her Lamictal level after last visit. She is not driving.   REVIEW OF SYSTEMS: Full 14 system review of systems performed and notable only for those listed, all others are neg:  Constitutional: neg  Cardiovascular: neg Ear/Nose/Throat: neg  Skin: neg Eyes: neg Respiratory: neg Gastroitestinal: neg  Hematology/Lymphatic: neg  Endocrine: neg Musculoskeletal:neg Allergy/Immunology: neg Neurological: neg Psychiatric: neg Sleep : neg   ALLERGIES: Allergies  Allergen Reactions  . Dye Fdc Red [Dye Fdc Red 3 (Erythrosine)] Nausea And Vomiting  . Flagyl [Metronidazole Hcl]     GI upset  . Morphine And Related Nausea And Vomiting    HOME MEDICATIONS: Outpatient Medications Prior to Visit    Medication Sig Dispense Refill  . lacosamide (VIMPAT) 200 MG TABS tablet Take 1 tablet (200 mg total) by mouth at bedtime. 30 tablet 5  . LAMICTAL 200 MG tablet TAKE 1 & 1/2 TABLETS BY MOUTH TWICE DAILY 90 tablet 11  . clindamycin (CLEOCIN) 300 MG capsule Take 1 capsule (300 mg total) by mouth 4 (four) times daily. (Patient not taking: Reported on 01/25/2017) 28 capsule 1   No facility-administered medications prior to visit.     PAST MEDICAL HISTORY: Past Medical History:  Diagnosis Date  . Anxiety   . Heart murmur   . Seizures ([place])     PAST SURGICAL HISTORY: Past Surgical History:  Procedure Laterality Date  . HYSTEROSCOPY W/D&C N/A 10/09/2015   Procedure: DILATATION AND CURETTAGE /HYSTEROSCOPY ;  Surgeon: Louretta Shorten, MD;  Location: [facility];  Service: Gynecology;  Laterality: N/A;  . LAPAROSCOPY N/A 10/09/2015   Procedure: LAPAROSCOPY DIAGNOSTIC with CO 2 laser  WITH CHROMOPERTUBATION AND LASER OF ENDOMETRIOSIS WITH LYSIS OF ADHESIONS;  Surgeon: Louretta Shorten, MD;  Location: [facility];  Service: Gynecology;  Laterality: N/A;    FAMILY HISTORY: Family History  Problem Relation Age of Onset  . Healthy Mother   . Healthy Father     SOCIAL HISTORY: Social History   Social History  . Marital status: Single    Spouse name: N/A  . Number of children: 0  . Years of education: college   Occupational History  .  [employer]   Social History Main Topics  . Smoking status: Never Smoker  . Smokeless tobacco: Never Used  .  Alcohol use No  . Drug use: No  . Sexual activity: Yes   Other Topics Concern  . Not on file   Social History Narrative   Patient lives at home with her boyfriend. Patient works full time at [employer].   Education- College   Right handed.   Caffeine- coffee four times a week.   Trying to get pregnant.  05-13-15     PHYSICAL EXAM  Vitals:   01/25/17 0801  BP: 109/73  Pulse: 71  Weight: 186 lb 12.8 oz (84.7 kg)  Height: 5\' 7"  (1.702 m)   Body mass  index is 29.26 kg/m. Generalized: Well developed, in no acute distress  2 months pregnant Head: normocephalic and atraumatic,. Oropharynx benign  Musculoskeletal: No deformity   Neurological examination   Mentation: Alert oriented to time, place, history taking. Follows all commands speech and language fluent  Cranial nerve II-XII: Pupils were equal round reactive to light extraocular movements were full, visual field were full on confrontational test. Facial sensation and strength were normal. hearing was intact to finger rubbing bilaterally. Uvula tongue midline. head turning and shoulder shrug were normal and symmetric.Tongue protrusion into cheek strength was normal. Motor: normal bulk and tone, full strength in the BUE, BLE, fine finger movements normal, no pronator drift. No focal weakness Coordination: finger-nose-finger, heel-to-shin bilaterally, no dysmetria, no tremor Reflexes: 1+ upper lower and symmetric, plantar responses were flexor bilaterally. Gait and Station: Rising up from seated position without assistance, normal stance, moderate stride, good arm swing, smooth turning, able to perform tiptoe, and heel walking without difficulty. Tandem gait is steady  DIAGNOSTIC DATA (LABS, IMAGING, TESTING) - I reviewed patient records, labs, notes, testing and imaging myself where available.  Lab Results  Component Value Date   WBC 4.6 10/09/2015   HGB 13.7 10/09/2015   HCT 40.0 10/09/2015   MCV 88.7 10/09/2015   PLT 376 10/09/2015    ASSESSMENT AND PLAN 36 y.o. year old female has a past medical history of Seizures; here to follow-up. Last generalized seizure occurred in 01/23/2015 after stopping her Klonopin abruptly. She is 2 months pregnant with twins and is  high risk because of seizure disorder.   PLAN: Continue Vimpat at current dose Continue Lamictal 300mg  twice daily  Will check level of Vimpat and Lamictal today for therapeutic levels pt is 2 months pregnant and  will probably need medication adjustments Call for seizure activity F/U in 3  months [name], [facility], [facility], [facility] [address] [place], [place] [address] [phone]

## 2017-01-27 ENCOUNTER — Telehealth: Payer: Self-pay | Admitting: *Deleted

## 2017-01-27 LAB — LACOSAMIDE: LACOSAMIDE: 4.5 ug/mL — AB (ref 5.0–10.0)

## 2017-01-27 LAB — LAMOTRIGINE LEVEL: Lamotrigine Lvl: 6 ug/mL (ref 2.0–20.0)

## 2017-01-27 NOTE — Telephone Encounter (Signed)
-----   Message from [name], NP sent at 01/27/2017  8:24 AM EDT ----- Labs look good continue same meds. Please call the patient

## 2017-01-27 NOTE — Telephone Encounter (Signed)
Spoke with pt and relayed per CM/NP that labs looked good.  Continue on same meds.  She verbalized understanding.

## 2017-02-02 MED FILL — DICLEGIS DR 10-10 MG TABLET: 10-10 | 30 days supply | Qty: 120 | Fill #0

## 2017-02-02 MED FILL — PROMETHAZINE 25 MG TABLET: 25 | 8 days supply | Qty: 30 | Fill #0

## 2017-02-03 ENCOUNTER — Encounter (HOSPITAL_COMMUNITY): Payer: Self-pay | Admitting: *Deleted

## 2017-02-03 ENCOUNTER — Inpatient Hospital Stay (HOSPITAL_COMMUNITY): Payer: 59

## 2017-02-03 ENCOUNTER — Inpatient Hospital Stay (HOSPITAL_COMMUNITY)
Admission: AD | Admit: 2017-02-03 | Discharge: 2017-02-03 | Disposition: A | Discharge status: Home / Self Care | Payer: 59 | Source: Ambulatory Visit | Attending: Obstetrics and Gynecology | Admitting: Obstetrics and Gynecology

## 2017-02-03 DIAGNOSIS — N939 Abnormal uterine and vaginal bleeding, unspecified: Secondary | ICD-10-CM | POA: Insufficient documentation

## 2017-02-03 DIAGNOSIS — O99341 Other mental disorders complicating pregnancy, first trimester: Secondary | ICD-10-CM | POA: Diagnosis not present

## 2017-02-03 DIAGNOSIS — O468X1 Other antepartum hemorrhage, first trimester: Secondary | ICD-10-CM

## 2017-02-03 DIAGNOSIS — Z3A09 9 weeks gestation of pregnancy: Secondary | ICD-10-CM | POA: Diagnosis not present

## 2017-02-03 DIAGNOSIS — F419 Anxiety disorder, unspecified: Secondary | ICD-10-CM | POA: Diagnosis not present

## 2017-02-03 DIAGNOSIS — O209 Hemorrhage in early pregnancy, unspecified: Secondary | ICD-10-CM | POA: Diagnosis not present

## 2017-02-03 DIAGNOSIS — O30001 Twin pregnancy, unspecified number of placenta and unspecified number of amniotic sacs, first trimester: Secondary | ICD-10-CM

## 2017-02-03 DIAGNOSIS — O208 Other hemorrhage in early pregnancy: Secondary | ICD-10-CM | POA: Diagnosis not present

## 2017-02-03 DIAGNOSIS — Z3A08 8 weeks gestation of pregnancy: Secondary | ICD-10-CM | POA: Diagnosis not present

## 2017-02-03 DIAGNOSIS — O30041 Twin pregnancy, dichorionic/diamniotic, first trimester: Secondary | ICD-10-CM | POA: Diagnosis not present

## 2017-02-03 DIAGNOSIS — O418X11 Other specified disorders of amniotic fluid and membranes, first trimester, fetus 1: Secondary | ICD-10-CM

## 2017-02-03 LAB — URINALYSIS, ROUTINE W REFLEX MICROSCOPIC
Bilirubin Urine: NEGATIVE
GLUCOSE, UA: NEGATIVE mg/dL
HGB URINE DIPSTICK: NEGATIVE
KETONES UR: NEGATIVE mg/dL
Leukocytes, UA: NEGATIVE
Nitrite: NEGATIVE
PROTEIN: NEGATIVE mg/dL
Specific Gravity, Urine: 1.016 (ref 1.005–1.030)
pH: 8 (ref 5.0–8.0)

## 2017-02-03 LAB — WET PREP, GENITAL
Clue Cells Wet Prep HPF POC: NONE SEEN
Sperm: NONE SEEN
TRICH WET PREP: NONE SEEN
Yeast Wet Prep HPF POC: NONE SEEN

## 2017-02-03 LAB — POCT PREGNANCY, URINE: PREG TEST UR: POSITIVE — AB

## 2017-02-03 MED ORDER — METOCLOPRAMIDE HCL 5 MG/ML IJ SOLN
10.0000 mg | Freq: Once | INTRAMUSCULAR | Status: AC
  Administered 2017-02-03: 10 mg via INTRAVENOUS
  Filled 2017-02-03: qty 2

## 2017-02-03 MED ORDER — LACTATED RINGERS IV BOLUS (SEPSIS)
1000.0000 mL | Freq: Once | INTRAVENOUS | Status: AC
  Administered 2017-02-03: 1000 mL via INTRAVENOUS

## 2017-02-03 MED FILL — VIMPAT 200 MG TABLET: 200 | 30 days supply | Qty: 30 | Fill #1

## 2017-02-03 NOTE — Discharge Instructions (Signed)
Diclegis Instructions 1.  Take 2 tablets at bedtime. 2.  If symptoms persist, add another tablet in the morning.  3.  If symptoms persist, add another tablet in the afternoon.  You may take up to 4 tablets/day   Morning Sickness Morning sickness is when you feel sick to your stomach (nauseous) during pregnancy. This nauseous feeling may or may not come with vomiting. It often occurs in the morning but can be a problem any time of day. Morning sickness is most common during the first trimester, but it may continue throughout pregnancy. While morning sickness is unpleasant, it is usually harmless unless you develop severe and continual vomiting (hyperemesis gravidarum). This condition requires more intense treatment. What are the causes? The cause of morning sickness is not completely known but seems to be related to normal hormonal changes that occur in pregnancy. What increases the risk? You are at greater risk if you:  Experienced nausea or vomiting before your pregnancy.  Had morning sickness during a previous pregnancy.  Are pregnant with more than one baby, such as twins.  How is this treated? Do not use any medicines (prescription, over-the-counter, or herbal) for morning sickness without first talking to your health care provider. Your health care provider may prescribe or recommend:  Vitamin B6 supplements.  Anti-nausea medicines.  The herbal medicine ginger.  Follow these instructions at home:  Only take over-the-counter or prescription medicines as directed by your health care provider.  Taking multivitamins before getting pregnant can prevent or decrease the severity of morning sickness in most women.  Eat a piece of dry toast or unsalted crackers before getting out of bed in the morning.  Eat five or six small meals a day.  Eat dry and bland foods (rice, baked potato). Foods high in carbohydrates are often helpful.  Do not drink liquids with your meals. Drink  liquids between meals.  Avoid greasy, fatty, and spicy foods.  Get someone to cook for you if the smell of any food causes nausea and vomiting.  If you feel nauseous after taking prenatal vitamins, take the vitamins at night or with a snack.  Snack on protein foods (nuts, yogurt, cheese) between meals if you are hungry.  Eat unsweetened gelatins for desserts.  Wearing an acupressure wristband (worn for sea sickness) may be helpful.  Acupuncture may be helpful.  Do not smoke.  Get a humidifier to keep the air in your house free of odors.  Get plenty of fresh air. Contact a health care provider if:  Your home remedies are not working, and you need medicine.  You feel dizzy or lightheaded.  You are losing weight. Get help right away if:  You have persistent and uncontrolled nausea and vomiting.  You pass out (faint). This information is not intended to replace advice given to you by your health care provider. Make sure you discuss any questions you have with your health care provider. Document Released: 10/06/2006 Document Revised: 01/21/2016 Document Reviewed: 01/30/2013 Elsevier Interactive Patient Education  2017 [employer].

## 2017-02-03 NOTE — MAU Provider Note (Signed)
Chief Complaint: No chief complaint on file.   None     SUBJECTIVE HPI: Anita [name] is a 36 y.o. G1P0 at 109w0d by IVF with twins pregnancy who presents to maternity admissions reporting abdominal cramping and light vaginal bleeding x 24 hours and ongoing n/v of pregnancy x 3-4 weeks. She reports the pain was mild yesterday, just soreness, but is increasing and is sharp lower abdominal pain that is intermittent today and not resolved by rest or heat.  The bleeding started this morning and was brown and spotting only but is now red and heavier than spotting but not requiring a pad.  She has not tried any treatments for bleeding.  There are no associated symptoms but she does have n/v x several weeks since early pregnancy.  She was taking Zofran but was changed to Diclegis yesterday and has taken 1 dose which has not helped her nausea.  She reports vomiting at least 4-5 times daily and can only drink 1/4 a bottle of water all day in small sips.   She denies vaginal bleeding, vaginal itching/burning, urinary symptoms, h/a, dizziness, n/v, or fever/chills.     HPI  Past Medical History:  Diagnosis Date  . Anxiety   . Heart murmur   . Seizures ([place])    Past Surgical History:  Procedure Laterality Date  . HYSTEROSCOPY W/D&C N/A 10/09/2015   Procedure: DILATATION AND CURETTAGE /HYSTEROSCOPY ;  Surgeon: Louretta Shorten, MD;  Location: [facility];  Service: Gynecology;  Laterality: N/A;  . LAPAROSCOPY N/A 10/09/2015   Procedure: LAPAROSCOPY DIAGNOSTIC with CO 2 laser  WITH CHROMOPERTUBATION AND LASER OF ENDOMETRIOSIS WITH LYSIS OF ADHESIONS;  Surgeon: Louretta Shorten, MD;  Location: [facility];  Service: Gynecology;  Laterality: N/A;   Social History   Social History  . Marital status: Single    Spouse name: N/A  . Number of children: 0  . Years of education: college   Occupational History  .  [employer]   Social History Main Topics  . Smoking status: Never Smoker  . Smokeless tobacco: Never Used  .  Alcohol use No  . Drug use: No  . Sexual activity: Yes   Other Topics Concern  . Not on file   Social History Narrative   Patient lives at home with her boyfriend. Patient works full time at [employer].   Education- College   Right handed.   Caffeine- coffee four times a week.   Trying to get pregnant.  05-13-15   No current facility-administered medications on file prior to encounter.    Current Outpatient Prescriptions on File Prior to Encounter  Medication Sig Dispense Refill  . folic acid (FOLVITE) 1 MG tablet Take 8 mg by mouth at bedtime.   5  . lacosamide (VIMPAT) 200 MG TABS tablet Take 1 tablet (200 mg total) by mouth at bedtime. 30 tablet 5  . LAMICTAL 200 MG tablet TAKE 1 & 1/2 TABLETS BY MOUTH TWICE DAILY 90 tablet 11  . Prenatal Vit-Fe Fumarate-FA (PRENATAL MULTIVITAMIN) TABS tablet Take 1 tablet by mouth at bedtime.      Allergies  Allergen Reactions  . Dye Fdc Red [Dye Fdc Red 3 (Erythrosine)] Nausea And Vomiting  . Flagyl [Metronidazole Hcl]     GI upset  . Morphine And Related Nausea And Vomiting    ROS:  Review of Systems  Constitutional: Negative for chills, fatigue and fever.  Respiratory: Negative for shortness of breath.   Cardiovascular: Negative for chest pain.  Gastrointestinal:  Positive for nausea and vomiting.  Genitourinary: Positive for pelvic pain and vaginal bleeding. Negative for difficulty urinating, dysuria, flank pain, vaginal discharge and vaginal pain.  Neurological: Negative for dizziness and headaches.  Psychiatric/Behavioral: Negative.      I have reviewed patient's Past Medical Hx, Surgical Hx, Family Hx, Social Hx, medications and allergies.   Physical Exam   Patient Vitals for the past 24 hrs:  BP Temp Temp src Pulse Resp SpO2 Height Weight  02/03/17 1056 121/67 98.6 F (37 C) Oral 73 18 - - -  02/03/17 0840 121/75 98.6 F (37 C) Oral 87 16 100 % 5\' 7"  (1.702 m) 186 lb 0.6 oz (84.4 kg)   Constitutional: Well-developed,  well-nourished female in no acute distress.  Cardiovascular: normal rate Respiratory: normal effort GI: Abd soft, non-tender. Pos BS x 4 MS: Extremities nontender, no edema, normal ROM Neurologic: Alert and oriented x 4.  GU: Neg CVAT.  PELVIC EXAM: Cervix pink, visually closed, without lesion, scant brown mixed with light red discharge, vaginal walls and external genitalia normal Bimanual exam: Cervix 0/long/high, firm, anterior, neg CMT, uterus nontender, nonenlarged, adnexa without tenderness, enlargement, or mass   LAB RESULTS Results for orders placed or performed during the hospital encounter of 02/03/17 (from the past 24 hour(s))  Urinalysis, Routine w reflex microscopic     Status: Abnormal   Collection Time: 02/03/17  8:41 AM  Result Value Ref Range   Color, Urine YELLOW YELLOW   APPearance CLOUDY (A) CLEAR   Specific Gravity, Urine 1.016 1.005 - 1.030   pH 8.0 5.0 - 8.0   Glucose, UA NEGATIVE NEGATIVE mg/dL   Hgb urine dipstick NEGATIVE NEGATIVE   Bilirubin Urine NEGATIVE NEGATIVE   Ketones, ur NEGATIVE NEGATIVE mg/dL   Protein, ur NEGATIVE NEGATIVE mg/dL   Nitrite NEGATIVE NEGATIVE   Leukocytes, UA NEGATIVE NEGATIVE  Wet prep, genital     Status: Abnormal   Collection Time: 02/03/17  9:03 AM  Result Value Ref Range   Yeast Wet Prep HPF POC NONE SEEN NONE SEEN   Trich, Wet Prep NONE SEEN NONE SEEN   Clue Cells Wet Prep HPF POC NONE SEEN NONE SEEN   WBC, Wet Prep HPF POC FEW (A) NONE SEEN   Sperm NONE SEEN   Pregnancy, urine POC     Status: Abnormal   Collection Time: 02/03/17  9:09 AM  Result Value Ref Range   Preg Test, Ur POSITIVE (A) NEGATIVE   Blood type O positive    IMAGING US Ob Comp Addl Gest Less 14 Wks  Result Date: 02/03/2017 CLINICAL DATA:  Vaginal bleeding EXAM: TWIN OBSTETRIC <14WK [place] AND TRANSVAGINAL OB US COMPARISON:  None. FINDINGS: Number of IUPs:  2 Chorionicity/Amnionicity:  Dichorionic-diamniotic (thick membrane) TWIN 1 Yolk sac:   Visualized Embryo:  Visualized Cardiac Activity: Visualized Heart Rate: 174 bpm MSD:   mm    w     d CRL:  2.4  mm   9 w 0 d                  [place] EDC: 09/08/2017 TWIN 2 Yolk sac:  Visualized Embryo:  Visualized Cardiac Activity: Visualized Heart Rate: 166 bpm MSD:   mm    w     d CRL:  2.24  mm   8 w 6 d                  [place] EDC: 09/09/2017 Subchorionic hemorrhage:  Small subchorionic hemorrhage Maternal uterus/adnexae:  No adnexal masses or free fluid. IMPRESSION: Dichorionic diamniotic twin pregnancy. Estimated gestational age [redacted] weeks. Small subchorionic hemorrhage. Electronically Signed   By: Rolm Baptise M.D.   On: 02/03/2017 11:13   US Ob Transvaginal  Result Date: 02/03/2017 CLINICAL DATA:  Vaginal bleeding EXAM: TWIN OBSTETRIC <14WK [place] AND TRANSVAGINAL OB US COMPARISON:  None. FINDINGS: Number of IUPs:  2 Chorionicity/Amnionicity:  Dichorionic-diamniotic (thick membrane) TWIN 1 Yolk sac:  Visualized Embryo:  Visualized Cardiac Activity: Visualized Heart Rate: 174 bpm MSD:   mm    w     d CRL:  2.4  mm   9 w 0 d                  [place] EDC: 09/08/2017 TWIN 2 Yolk sac:  Visualized Embryo:  Visualized Cardiac Activity: Visualized Heart Rate: 166 bpm MSD:   mm    w     d CRL:  2.24  mm   8 w 6 d                  [place] EDC: 09/09/2017 Subchorionic hemorrhage:  Small subchorionic hemorrhage Maternal uterus/adnexae: No adnexal masses or free fluid. IMPRESSION: Dichorionic diamniotic twin pregnancy. Estimated gestational age [redacted] weeks. Small subchorionic hemorrhage. Electronically Signed   By: Rolm Baptise M.D.   On: 02/03/2017 11:13    MAU Management/MDM: Ordered labs and [place] and reviewed results. IUP with normal FHR for Baby A and Baby B in Di/Di twins pregnancy noted.  Small subchorionic hemorrhage likely explains bleeding. Discussed good prognosis but potential risks of [facility] with pt and questions answered.  Reports feeling better after LR x 1000 ml and Reglan 10 mg IV. She declines Rx for new medications because she  just started Diclegis yesterday and will pick up Phenergan Rx she has not taken.  Consult Dr Helane Rima with assessment and findings. D/C home with routine follow up, return to MAU as needed for heavy bleeding/severe pain. Pt stable at time of discharge.  ASSESSMENT 1. Dichorionic diamniotic twin pregnancy in first trimester   2. Vaginal bleeding in pregnancy, first trimester   3. Subchorionic hemorrhage of placenta in first trimester, fetus 1 of multiple gestation     PLAN Discharge home with bleeding precautions  Allergies as of 02/03/2017      Reactions   Dye Fdc Red [dye Fdc Red 3 (erythrosine)] Nausea And Vomiting   Flagyl [metronidazole Hcl]    GI upset   Morphine And Related Nausea And Vomiting      Medication List    TAKE these medications   DICLEGIS 10-10 MG Tbec Generic drug:  Doxylamine-Pyridoxine Take 2 tablets by mouth at bedtime as needed (for nauesa).   folic acid 1 MG tablet Commonly known as:  FOLVITE Take 8 mg by mouth at bedtime.   lacosamide 200 MG Tabs tablet Commonly known as:  VIMPAT Take 1 tablet (200 mg total) by mouth at bedtime.   LAMICTAL 200 MG tablet Generic drug:  lamoTRIgine TAKE 1 & 1/2 TABLETS BY MOUTH TWICE DAILY   prenatal multivitamin Tabs tablet Take 1 tablet by mouth at bedtime.      Follow-up Information    Louretta Shorten, MD Follow up.   Specialty:  Obstetrics and Gynecology Why:  As scheduled, return to MAU as needed for emergencies Contact information: [place], [place] [address] [phone]           Fatima Blank Certified Nurse-Midwife 02/03/2017  11:51 AM

## 2017-02-03 NOTE — MAU Note (Signed)
IVF --states twin pregnancy EDD 09/08/17  +mid and lower right abdominal pains Sharp in nature Intermittent pain Rating pain 3-4/10 Pain started this am; states was just sore yesterday  +nausea/vomiting x2 weeks Emesis x3 in past 24 hours States not able to hold much down to eat or drink  +vaginal bleeding Started this am Went from brown to red Wearing panty liner; more noticeable when wipes

## 2017-02-06 ENCOUNTER — Other Ambulatory Visit (HOSPITAL_COMMUNITY): Payer: Self-pay | Admitting: Advanced Practice Midwife

## 2017-02-06 DIAGNOSIS — O30001 Twin pregnancy, unspecified number of placenta and unspecified number of amniotic sacs, first trimester: Secondary | ICD-10-CM

## 2017-02-06 DIAGNOSIS — O209 Hemorrhage in early pregnancy, unspecified: Secondary | ICD-10-CM

## 2017-02-08 DIAGNOSIS — Z3480 Encounter for supervision of other normal pregnancy, unspecified trimester: Secondary | ICD-10-CM | POA: Diagnosis not present

## 2017-02-08 LAB — OB RESULTS CONSOLE ANTIBODY SCREEN: Antibody Screen: NEGATIVE

## 2017-02-08 LAB — OB RESULTS CONSOLE GC/CHLAMYDIA
CHLAMYDIA, DNA PROBE: NEGATIVE
Gonorrhea: NEGATIVE

## 2017-02-08 LAB — OB RESULTS CONSOLE ABO/RH: RH Type: POSITIVE

## 2017-02-08 LAB — OB RESULTS CONSOLE HEPATITIS B SURFACE ANTIGEN: HEP B S AG: NEGATIVE

## 2017-02-08 LAB — OB RESULTS CONSOLE RPR: RPR: NONREACTIVE

## 2017-02-08 LAB — OB RESULTS CONSOLE RUBELLA ANTIBODY, IGM: Rubella: IMMUNE

## 2017-02-08 LAB — OB RESULTS CONSOLE HIV ANTIBODY (ROUTINE TESTING): HIV: NONREACTIVE

## 2017-02-14 DIAGNOSIS — Z3480 Encounter for supervision of other normal pregnancy, unspecified trimester: Secondary | ICD-10-CM | POA: Diagnosis not present

## 2017-02-14 DIAGNOSIS — Z36 Encounter for antenatal screening for chromosomal anomalies: Secondary | ICD-10-CM | POA: Diagnosis not present

## 2017-02-14 DIAGNOSIS — Z3491 Encounter for supervision of normal pregnancy, unspecified, first trimester: Secondary | ICD-10-CM | POA: Diagnosis not present

## 2017-02-14 MED FILL — LaMICtal 200 MG TABS: 200 | 30 days supply | Qty: 90 | Fill #11

## 2017-02-14 MED FILL — METOCLOPRAMIDE 10 MG TABLET: 10 | 30 days supply | Qty: 90 | Fill #0

## 2017-02-27 DIAGNOSIS — Z3A12 12 weeks gestation of pregnancy: Secondary | ICD-10-CM | POA: Diagnosis not present

## 2017-02-27 DIAGNOSIS — Z3682 Encounter for antenatal screening for nuchal translucency: Secondary | ICD-10-CM | POA: Diagnosis not present

## 2017-02-27 DIAGNOSIS — O3091 Multiple gestation, unspecified, first trimester: Secondary | ICD-10-CM | POA: Diagnosis not present

## 2017-03-03 MED FILL — VIMPAT 200 MG TABLET: 200 | 30 days supply | Qty: 30 | Fill #2

## 2017-03-14 ENCOUNTER — Other Ambulatory Visit: Payer: Self-pay | Admitting: Nurse Practitioner

## 2017-03-14 MED FILL — LaMICtal 200 MG TABS: 200 | 30 days supply | Qty: 90 | Fill #0

## 2017-03-14 MED FILL — ONDANSETRON ODT 4 MG TABLET: 4 | 14 days supply | Qty: 28 | Fill #1

## 2017-03-29 DIAGNOSIS — Z3480 Encounter for supervision of other normal pregnancy, unspecified trimester: Secondary | ICD-10-CM | POA: Diagnosis not present

## 2017-03-30 ENCOUNTER — Telehealth: Payer: Self-pay | Admitting: Cardiovascular Disease

## 2017-03-30 NOTE — Telephone Encounter (Signed)
Received records from [facility] for appointment on 05/04/17 with Dr Oval Linsey.  Records put with Dr Blenda Mounts schedule for 05/04/17. lp

## 2017-04-04 MED FILL — VIMPAT 200 MG TABLET: 200 | 30 days supply | Qty: 30 | Fill #3

## 2017-04-10 DIAGNOSIS — Z3400 Encounter for supervision of normal first pregnancy, unspecified trimester: Secondary | ICD-10-CM | POA: Diagnosis not present

## 2017-04-10 DIAGNOSIS — Z363 Encounter for antenatal screening for malformations: Secondary | ICD-10-CM | POA: Diagnosis not present

## 2017-04-10 DIAGNOSIS — Z3A18 18 weeks gestation of pregnancy: Secondary | ICD-10-CM | POA: Diagnosis not present

## 2017-04-13 MED FILL — LaMICtal 200 MG TABS: 200 | 30 days supply | Qty: 90 | Fill #1

## 2017-04-28 MED FILL — ONDANSETRON ODT 8 MG TABLET: 8 | 10 days supply | Qty: 30 | Fill #0

## 2017-05-03 ENCOUNTER — Ambulatory Visit: Payer: 59 | Admitting: Nurse Practitioner

## 2017-05-04 ENCOUNTER — Ambulatory Visit: Payer: 59 | Admitting: Cardiovascular Disease

## 2017-05-04 ENCOUNTER — Encounter: Payer: Self-pay | Admitting: *Deleted

## 2017-05-04 MED FILL — VIMPAT 200 MG TABLET: 200 | 30 days supply | Qty: 30 | Fill #4

## 2017-05-04 NOTE — Progress Notes (Deleted)
Cardiology Office Note   Date:  05/04/2017   ID:  Anita [name], DOB 12/08/1980, MRN [id number]  PCP:  Dickie La, MD  Cardiologist:   Skeet Latch, MD   No chief complaint on file.     History of Present Illness: Anita [name] is a 36 y.o. female with seizures who presents for *** Ms. Anita [name] is pregnant with twins.  She reported palpitations to her OB/GYN, Dr. Everlene Farrier, and was referred to   Past Medical History:  Diagnosis Date  . Anxiety   . Heart murmur   . Seizures ([place])     Past Surgical History:  Procedure Laterality Date  . HYSTEROSCOPY W/D&C N/A 10/09/2015   Procedure: DILATATION AND CURETTAGE /HYSTEROSCOPY ;  Surgeon: Louretta Shorten, MD;  Location: [facility];  Service: Gynecology;  Laterality: N/A;  . LAPAROSCOPY N/A 10/09/2015   Procedure: LAPAROSCOPY DIAGNOSTIC with CO 2 laser  WITH CHROMOPERTUBATION AND LASER OF ENDOMETRIOSIS WITH LYSIS OF ADHESIONS;  Surgeon: Louretta Shorten, MD;  Location: [facility];  Service: Gynecology;  Laterality: N/A;     Current Outpatient Prescriptions  Medication Sig Dispense Refill  . DICLEGIS 10-10 MG TBEC Take 2 tablets by mouth at bedtime as needed (for nauesa).     . folic acid (FOLVITE) 1 MG tablet Take 8 mg by mouth at bedtime.   5  . lacosamide (VIMPAT) 200 MG TABS tablet Take 1 tablet (200 mg total) by mouth at bedtime. 30 tablet 5  . LAMICTAL 200 MG tablet TAKE 1 & 1/2 TABLETS BY MOUTH TWICE DAILY 90 tablet 11  . Prenatal Vit-Fe Fumarate-FA (PRENATAL MULTIVITAMIN) TABS tablet Take 1 tablet by mouth at bedtime.      No current facility-administered medications for this visit.     Allergies:   Dye fdc red [dye fdc red 3 (erythrosine)]; Flagyl [metronidazole hcl]; and Morphine and related    Social History:  The patient  reports that she has never smoked. She has never used smokeless tobacco. She reports that she does not drink alcohol or use drugs.   Family History:  The patient's ***family history includes Healthy in her  father and mother.    ROS:  Please see the history of present illness.   Otherwise, review of systems are positive for {NONE DEFAULTED:18576::"none"}.   All other systems are reviewed and negative.    PHYSICAL EXAM: VS:  There were no vitals taken for this visit. , BMI There is no height or weight on file to calculate BMI. GENERAL:  Well appearing HEENT:  Pupils equal round and reactive, fundi not visualized, oral mucosa unremarkable NECK:  No jugular venous distention, waveform within normal limits, carotid upstroke brisk and symmetric, no bruits, no thyromegaly LYMPHATICS:  No cervical adenopathy LUNGS:  Clear to auscultation bilaterally HEART:  RRR.  PMI not displaced or sustained,S1 and S2 within normal limits, no S3, no S4, no clicks, no rubs, *** murmurs ABD:  Flat, positive bowel sounds normal in frequency in pitch, no bruits, no rebound, no guarding, no midline pulsatile mass, no hepatomegaly, no splenomegaly EXT:  2 plus pulses throughout, no edema, no cyanosis no clubbing SKIN:  No rashes no nodules NEURO:  Cranial nerves II through XII grossly intact, motor grossly intact throughout PSYCH:  Cognitively intact, oriented to person place and time    EKG:  EKG {ACTION; IS/IS ZTI:45809983} ordered today. The ekg ordered today demonstrates ***   Recent Labs: No results found for requested labs within last 8760  hours.    Lipid Panel No results found for: CHOL, TRIG, HDL, CHOLHDL, VLDL, LDLCALC, LDLDIRECT    Wt Readings from Last 3 Encounters:  02/03/17 84.4 kg (186 lb 0.6 oz)  01/25/17 84.7 kg (186 lb 12.8 oz)  05/25/16 76.9 kg (169 lb 9.6 oz)      ASSESSMENT AND PLAN:  ***   Current medicines are reviewed at length with the patient today.  The patient {ACTIONS; HAS/DOES NOT HAVE:19233} concerns regarding medicines.  The following changes have been made:  {PLAN; NO CHANGE:13088:s}  Labs/ tests ordered today include: *** No orders of the defined types were placed  in this encounter.    Disposition:   FU with ***    This note was written with the assistance of speech recognition software.  Please excuse any transcriptional errors.  Signed, Deniz Eskridge C. Oval Linsey, MD, [facility]  05/04/2017 9:28 AM    [facility]

## 2017-05-09 DIAGNOSIS — Z3A22 22 weeks gestation of pregnancy: Secondary | ICD-10-CM | POA: Diagnosis not present

## 2017-05-09 DIAGNOSIS — O30042 Twin pregnancy, dichorionic/diamniotic, second trimester: Secondary | ICD-10-CM | POA: Diagnosis not present

## 2017-05-09 DIAGNOSIS — Z362 Encounter for other antenatal screening follow-up: Secondary | ICD-10-CM | POA: Diagnosis not present

## 2017-05-10 MED FILL — LaMICtal 200 MG TABS: 200 | 30 days supply | Qty: 90 | Fill #2

## 2017-06-01 MED FILL — VIMPAT 200 MG TABLET: 200 | 30 days supply | Qty: 30 | Fill #5

## 2017-06-01 MED FILL — DICLEGIS DR 10-10 MG TABLET: 10-10 | 30 days supply | Qty: 120 | Fill #1

## 2017-06-12 DIAGNOSIS — Z3400 Encounter for supervision of normal first pregnancy, unspecified trimester: Secondary | ICD-10-CM | POA: Diagnosis not present

## 2017-06-12 DIAGNOSIS — O30042 Twin pregnancy, dichorionic/diamniotic, second trimester: Secondary | ICD-10-CM | POA: Diagnosis not present

## 2017-06-12 DIAGNOSIS — Z23 Encounter for immunization: Secondary | ICD-10-CM | POA: Diagnosis not present

## 2017-06-12 DIAGNOSIS — Z3480 Encounter for supervision of other normal pregnancy, unspecified trimester: Secondary | ICD-10-CM | POA: Diagnosis not present

## 2017-06-12 DIAGNOSIS — Z3A27 27 weeks gestation of pregnancy: Secondary | ICD-10-CM | POA: Diagnosis not present

## 2017-06-13 MED FILL — LaMICtal 200 MG TABS: 200 | 30 days supply | Qty: 90 | Fill #3

## 2017-06-20 NOTE — Progress Notes (Signed)
[facility]  PATIENT: Anita [name] DOB: [birth year]   REASON FOR VISIT: Follow-up for generalized epilepsy pregnant with twins 28 weeks HISTORY FROM: Patient    HISTORY OF PRESENT ILLNESS:Anita [name], 36 year old female returns for followup.   She has a history of generalized seizure disorder with last seizure occurring Jan 23, 2015 after stopping her Klonopin abruptly for 1 week. She is [redacted] weeks  pregnant with twins. She is aware that she is a high risk pregnancy due to her seizure disorder  She is currently on Vimpat 200 mg at bedtime and Lamictal 300 mg in the morning and 300 at night. She continues to work full time. No other new neurologic complaints. She returns for reevaluation. She was made  aware that she will need to be seen every 3 months for labs and possible medication adjustments of her seizure medication. Her labs were stable at her last visit    HISTORY: generalized seizure disorder. Date of last seizure 08/24/2012 when she hit her head on the table after having a seizure. CT of the head was normal. She denies further staring spells, confusion, sleep disturbances, lapses of time, headache and bowel and bladder incontinence. She continues to work full-time at [employer] . She did not get her Lamictal level after last visit. She is not driving.   REVIEW OF SYSTEMS: Full 14 system review of systems performed and notable only for those listed, all others are neg:  Constitutional: neg  Cardiovascular: neg Ear/Nose/Throat: neg  Skin: neg Eyes: neg Respiratory: neg Gastroitestinal: neg  Hematology/Lymphatic: neg  Endocrine: neg Musculoskeletal:neg Allergy/Immunology: neg Neurological: neg Psychiatric: neg Sleep : neg   ALLERGIES: Allergies  Allergen Reactions  . Dye Fdc Red [Dye Fdc Red 3 (Erythrosine)] Nausea And Vomiting  . Flagyl [Metronidazole Hcl]     GI upset  . Morphine And Related Nausea And Vomiting    HOME  MEDICATIONS: Outpatient Medications Prior to Visit  Medication Sig Dispense Refill  . DICLEGIS 10-10 MG TBEC Take 2 tablets by mouth at bedtime as needed (for nauesa).     . folic acid (FOLVITE) 1 MG tablet Take 8 mg by mouth at bedtime.   5  . lacosamide (VIMPAT) 200 MG TABS tablet Take 1 tablet (200 mg total) by mouth at bedtime. 30 tablet 5  . LAMICTAL 200 MG tablet TAKE 1 & 1/2 TABLETS BY MOUTH TWICE DAILY 90 tablet 11  . Prenatal Vit-Fe Fumarate-FA (PRENATAL MULTIVITAMIN) TABS tablet Take 1 tablet by mouth at bedtime.      No facility-administered medications prior to visit.     PAST MEDICAL HISTORY: Past Medical History:  Diagnosis Date  . Anxiety   . Heart murmur   . Seizures ([place])     PAST SURGICAL HISTORY: Past Surgical History:  Procedure Laterality Date  . HYSTEROSCOPY W/D&C N/A 10/09/2015   Procedure: DILATATION AND CURETTAGE /HYSTEROSCOPY ;  Surgeon: Louretta Shorten, MD;  Location: [facility];  Service: Gynecology;  Laterality: N/A;  . LAPAROSCOPY N/A 10/09/2015   Procedure: LAPAROSCOPY DIAGNOSTIC with CO 2 laser  WITH CHROMOPERTUBATION AND LASER OF ENDOMETRIOSIS WITH LYSIS OF ADHESIONS;  Surgeon: Louretta Shorten, MD;  Location: [facility];  Service: Gynecology;  Laterality: N/A;    FAMILY HISTORY: Family History  Problem Relation Age of Onset  . Healthy Mother   . Healthy Father     SOCIAL HISTORY: Social History   Social History  . Marital status: Single    Spouse name: N/A  . Number  of children: 0  . Years of education: college   Occupational History  .  [employer]   Social History Main Topics  . Smoking status: Never Smoker  . Smokeless tobacco: Never Used  . Alcohol use No  . Drug use: No  . Sexual activity: Yes   Other Topics Concern  . Not on file   Social History Narrative   Patient lives at home with her boyfriend. Patient works full time at [employer].   Education- College   Right handed.   Caffeine- coffee four times a week.   Trying to get  pregnant.  05-13-15     PHYSICAL EXAM  Vitals:   06/21/17 0844  BP: 126/72  Pulse: 94  Weight: 201 lb 3.2 oz (91.3 kg)  Height: 5\' 7"  (1.702 m)   Body mass index is 31.51 kg/m. Generalized: Well developed, in no acute distress  2 [redacted] weeks  pregnant Head: normocephalic and atraumatic,. Oropharynx benign  Musculoskeletal: No deformity   Neurological examination   Mentation: Alert oriented to time, place, history taking. Follows all commands speech and language fluent  Cranial nerve II-XII: Pupils were equal round reactive to light extraocular movements were full, visual field were full on confrontational test. Facial sensation and strength were normal. hearing was intact to finger rubbing bilaterally. Uvula tongue midline. head turning and shoulder shrug were normal and symmetric.Tongue protrusion into cheek strength was normal. Motor: normal bulk and tone, full strength in the BUE, BLE, fine finger movements normal, no pronator drift. No focal weakness Coordination: finger-nose-finger, heel-to-shin bilaterally, no dysmetria, no tremor Reflexes: 1+ upper lower and symmetric, plantar responses were flexor bilaterally. Gait and Station: Rising up from seated position without assistance, normal stance, moderate stride, good arm swing, smooth turning, able to perform tiptoe, and heel walking without difficulty. Tandem gait is steady  DIAGNOSTIC DATA (LABS, IMAGING, TESTING) - I reviewed patient records, labs, notes, testing and imaging myself where available.  Lab Results  Component Value Date   WBC 4.6 10/09/2015   HGB 13.7 10/09/2015   HCT 40.0 10/09/2015   MCV 88.7 10/09/2015   PLT 376 10/09/2015    ASSESSMENT AND PLAN 36 y.o. year old female has a past medical history of Seizures; here to follow-up. Last generalized seizure occurred in 01/23/2015 after stopping her Klonopin abruptly. She is [redacted] weeks pregnant with twins and is  high risk because of seizure disorder.    PLAN: Continue Vimpat at current dose will refill Continue Lamictal 300mg  twice daily  Will check level of Vimpat and Lamictal today for therapeutic levels pt is [redacted] weeks  pregnant and will probably need medication adjustments Call for seizure activity F/U in 3  months [name], [facility], [facility], [facility] [address] [place], [place] [address] [phone]

## 2017-06-21 ENCOUNTER — Ambulatory Visit (INDEPENDENT_AMBULATORY_CARE_PROVIDER_SITE_OTHER): Payer: 59 | Admitting: Nurse Practitioner

## 2017-06-21 ENCOUNTER — Encounter: Payer: Self-pay | Admitting: Nurse Practitioner

## 2017-06-21 VITALS — BP 126/72 | HR 94 | Ht 67.0 in | Wt 201.2 lb

## 2017-06-21 DIAGNOSIS — G40309 Generalized idiopathic epilepsy and epileptic syndromes, not intractable, without status epilepticus: Secondary | ICD-10-CM

## 2017-06-21 DIAGNOSIS — Z5181 Encounter for therapeutic drug level monitoring: Secondary | ICD-10-CM | POA: Diagnosis not present

## 2017-06-21 MED ORDER — LACOSAMIDE 200 MG PO TABS
200.0000 mg | ORAL_TABLET | Freq: Every day | ORAL | 1 refills | Status: DC
Start: 2017-06-21 — End: 2018-01-02

## 2017-06-21 NOTE — Patient Instructions (Signed)
Continue Vimpat at current dose Continue Lamictal 300mg  twice daily  Will check level of Vimpat and Lamictal today for therapeutic levels pt is [redacted] weeks  pregnant and will probably need medication adjustments Call for seizure activity F/U in 3  months

## 2017-06-21 NOTE — Progress Notes (Signed)
Fax confirmation received vimpat [facility] outpt pharm  [phone] sy

## 2017-06-22 NOTE — Progress Notes (Signed)
I have reviewed and agreed above plan. 

## 2017-06-23 ENCOUNTER — Telehealth: Payer: Self-pay

## 2017-06-23 LAB — LACOSAMIDE: LACOSAMIDE: 4.9 ug/mL — AB (ref 5.0–10.0)

## 2017-06-23 LAB — LAMOTRIGINE LEVEL: LAMOTRIGINE LVL: 5.3 ug/mL (ref 2.0–20.0)

## 2017-06-23 NOTE — Telephone Encounter (Signed)
-----   Message from [name], NP sent at 06/23/2017  9:58 AM EDT ----- Lamictal level good continue same dose

## 2017-06-23 NOTE — Telephone Encounter (Signed)
I left a detailed message with lab results, ok per dpr. I advised patient to call back with any questions or concerns.

## 2017-06-27 DIAGNOSIS — Z3480 Encounter for supervision of other normal pregnancy, unspecified trimester: Secondary | ICD-10-CM | POA: Diagnosis not present

## 2017-06-27 MED FILL — ONDANSETRON ODT 8 MG TABLET: 8 | 10 days supply | Qty: 30 | Fill #1

## 2017-06-28 IMAGING — US US OB COMP LESS 14 WK
1 series · 15 of 28 positions shown · non-contrast
Comparison: None.

CLINICAL DATA: Vaginal bleeding

EXAM:
TWIN OBSTETRIC <14WK US AND TRANSVAGINAL OB US

[Series 1: us ob comp less 14 wk · 81 acquisitions, 15 frames shown]
[im 1/81]
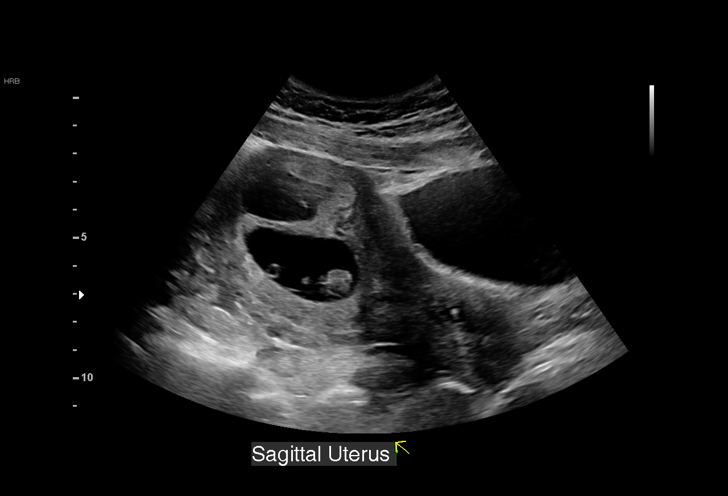
[im 6/81]
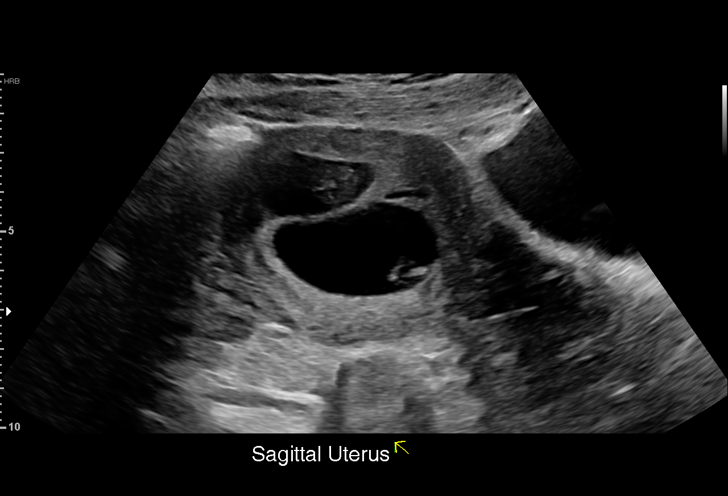
[im 12/81]
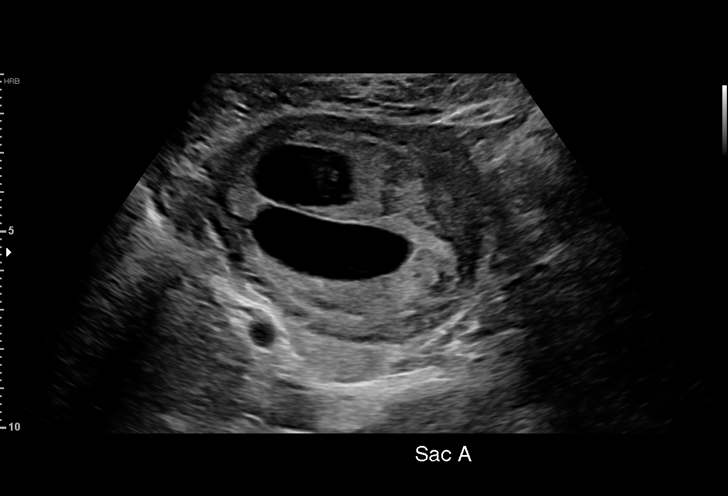
[im 18/81]
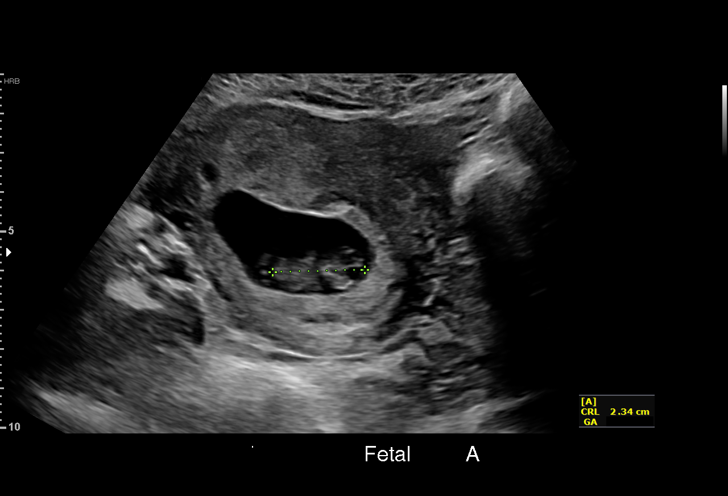
[im 24/81]
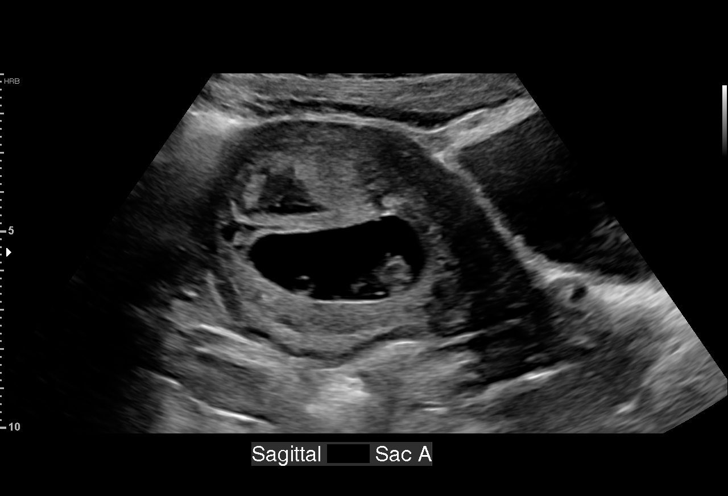
[im 30/81]
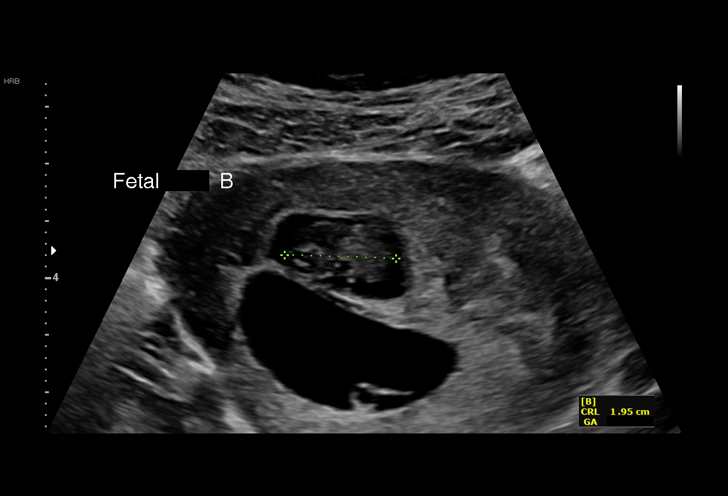
[im 36/81]
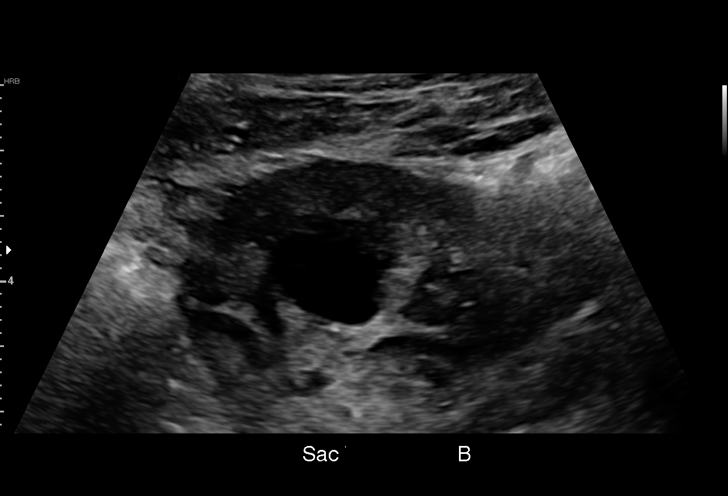
[im 42/81]
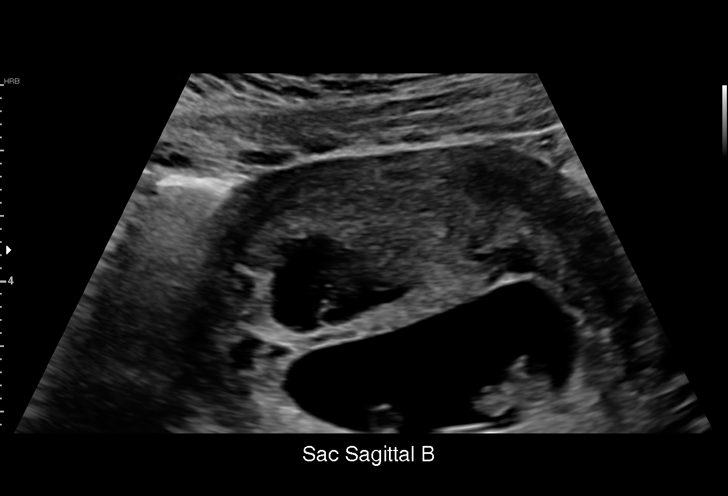
[im 45/81]
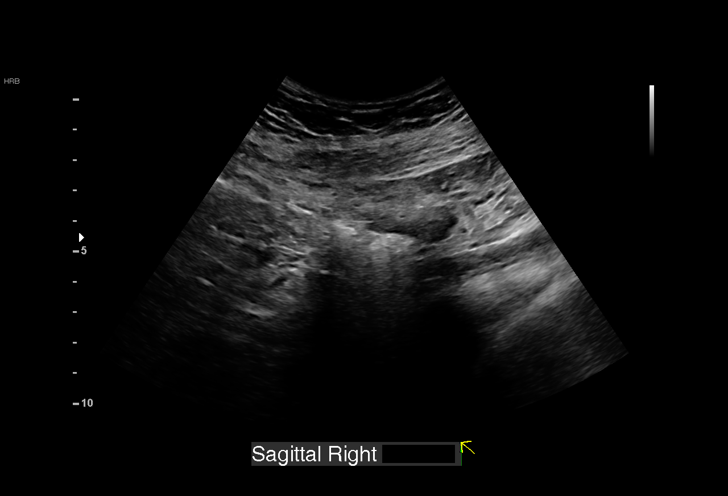
[im 51/81]
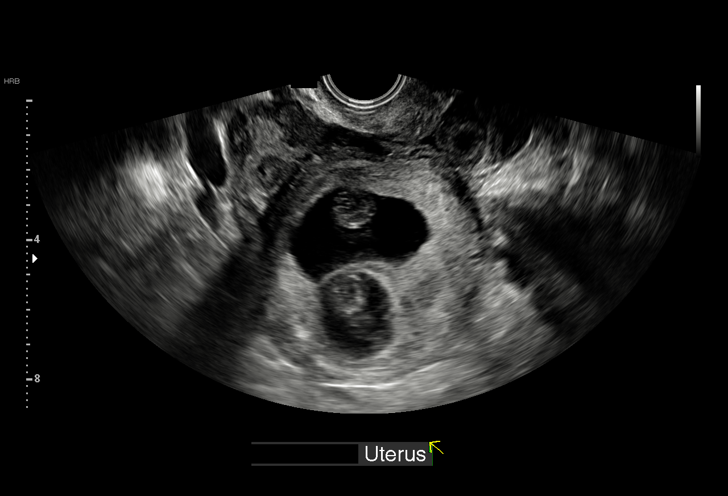
[im 57/81]
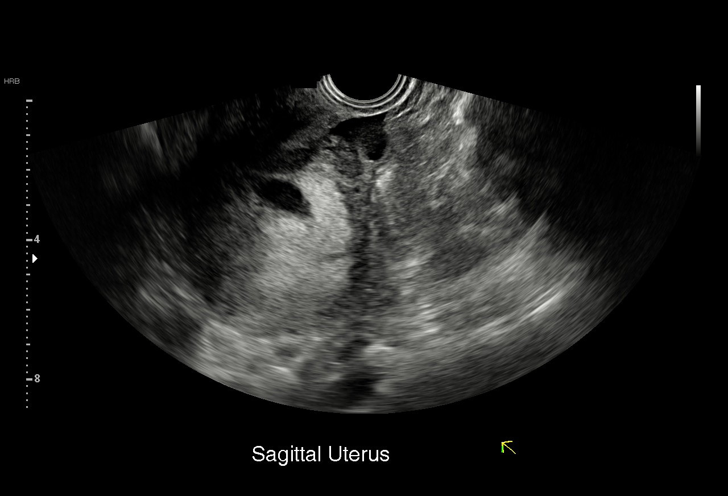
[im 63/81]
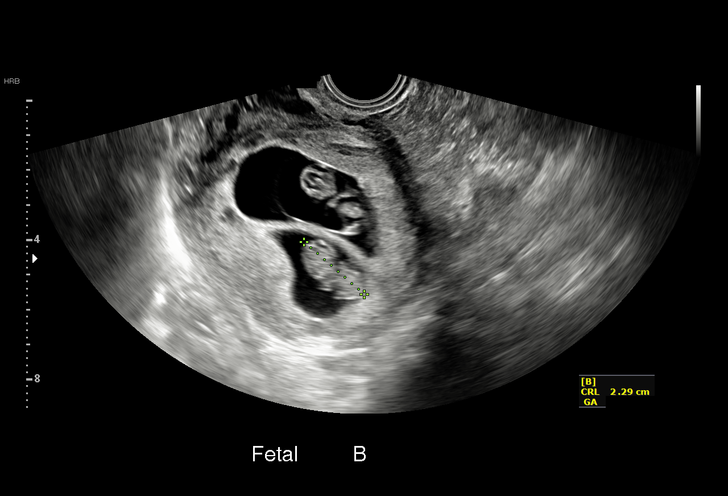
[im 69/81]
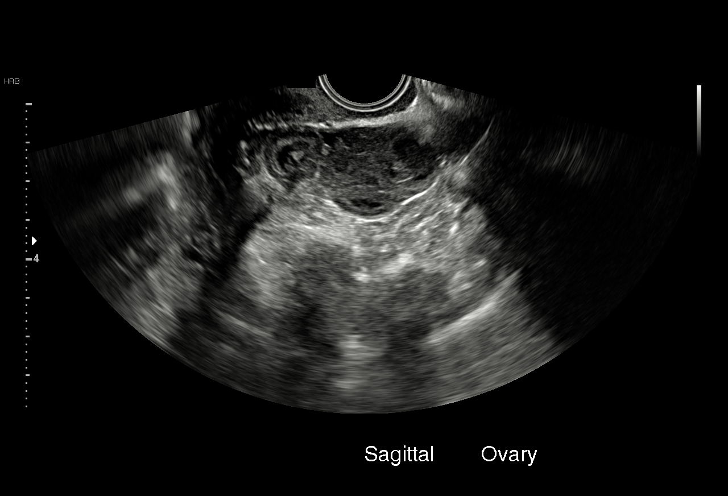
[im 75/81]
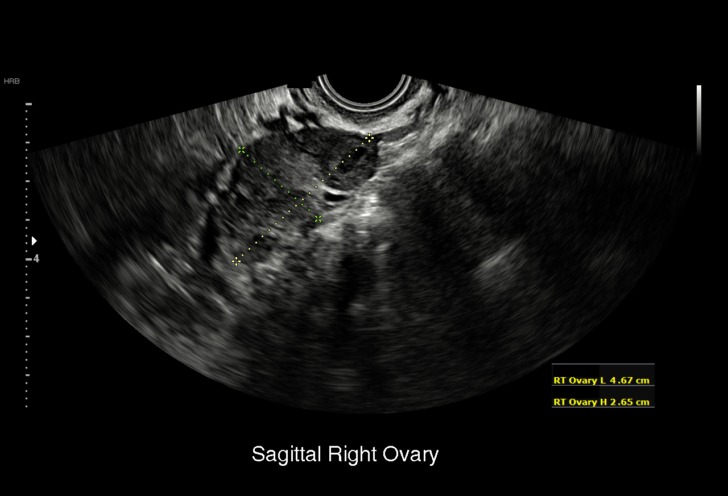
[im 81/81]
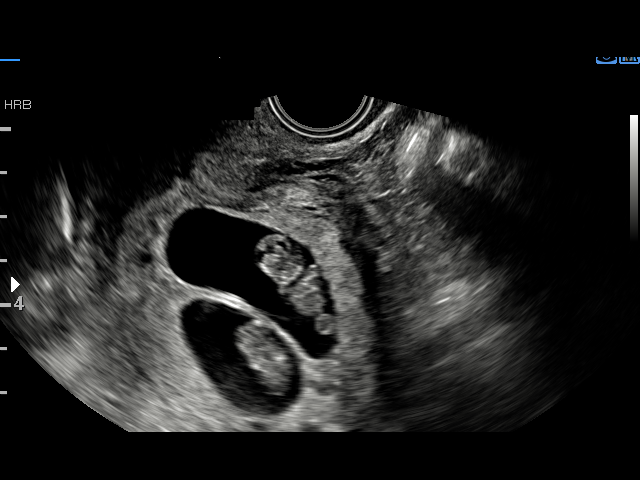

[15 of 28 positions shown; findings below may reference images not displayed]

FINDINGS: Number of IUPs:  2

Chorionicity/Amnionicity:  Dichorionic-diamniotic (thick membrane)

TWIN 1

Yolk sac:  Visualized

Embryo:  Visualized

Cardiac Activity: Visualized

Heart Rate: 174 bpm

MSD:   mm    w     d

CRL:  2.4  mm   9 w 0 d                  US EDC: 09/08/2017

TWIN 2

Yolk sac:  Visualized

Embryo:  Visualized

Cardiac Activity: Visualized

Heart Rate: 166 bpm

MSD:   mm    w     d

CRL:  2.24  mm   8 w 6 d                  US EDC: 09/09/2017

Subchorionic hemorrhage:  Small subchorionic hemorrhage

Maternal uterus/adnexae: No adnexal masses or free fluid.
IMPRESSION: Dichorionic diamniotic twin pregnancy. Estimated gestational age 9
weeks. Small subchorionic hemorrhage.

## 2017-07-04 MED FILL — VIMPAT 200 MG TABLET: 200 | 30 days supply | Qty: 30 | Fill #0

## 2017-07-11 MED FILL — LaMICtal 200 MG TABS: 200 | 30 days supply | Qty: 90 | Fill #4

## 2017-07-13 DIAGNOSIS — Z3400 Encounter for supervision of normal first pregnancy, unspecified trimester: Secondary | ICD-10-CM | POA: Diagnosis not present

## 2017-07-13 DIAGNOSIS — Z3A31 31 weeks gestation of pregnancy: Secondary | ICD-10-CM | POA: Diagnosis not present

## 2017-07-13 DIAGNOSIS — O30043 Twin pregnancy, dichorionic/diamniotic, third trimester: Secondary | ICD-10-CM | POA: Diagnosis not present

## 2017-07-17 ENCOUNTER — Inpatient Hospital Stay (HOSPITAL_COMMUNITY)
Admission: AD | Admit: 2017-07-17 | Discharge: 2017-07-17 | Disposition: A | Discharge status: Home / Self Care | Payer: 59 | Source: Ambulatory Visit | Attending: Obstetrics and Gynecology | Admitting: Obstetrics and Gynecology

## 2017-07-17 ENCOUNTER — Encounter (HOSPITAL_COMMUNITY): Payer: Self-pay

## 2017-07-17 DIAGNOSIS — O26893 Other specified pregnancy related conditions, third trimester: Secondary | ICD-10-CM | POA: Diagnosis not present

## 2017-07-17 DIAGNOSIS — Z881 Allergy status to other antibiotic agents status: Secondary | ICD-10-CM | POA: Diagnosis not present

## 2017-07-17 DIAGNOSIS — M549 Dorsalgia, unspecified: Secondary | ICD-10-CM

## 2017-07-17 DIAGNOSIS — O9989 Other specified diseases and conditions complicating pregnancy, childbirth and the puerperium: Secondary | ICD-10-CM

## 2017-07-17 DIAGNOSIS — Z79899 Other long term (current) drug therapy: Secondary | ICD-10-CM | POA: Insufficient documentation

## 2017-07-17 DIAGNOSIS — Z91048 Other nonmedicinal substance allergy status: Secondary | ICD-10-CM | POA: Diagnosis not present

## 2017-07-17 DIAGNOSIS — O30043 Twin pregnancy, dichorionic/diamniotic, third trimester: Secondary | ICD-10-CM | POA: Diagnosis not present

## 2017-07-17 DIAGNOSIS — O99891 Other specified diseases and conditions complicating pregnancy: Secondary | ICD-10-CM

## 2017-07-17 DIAGNOSIS — Z9889 Other specified postprocedural states: Secondary | ICD-10-CM | POA: Insufficient documentation

## 2017-07-17 DIAGNOSIS — M545 Low back pain: Secondary | ICD-10-CM | POA: Diagnosis not present

## 2017-07-17 DIAGNOSIS — Z885 Allergy status to narcotic agent status: Secondary | ICD-10-CM | POA: Insufficient documentation

## 2017-07-17 DIAGNOSIS — Z3A32 32 weeks gestation of pregnancy: Secondary | ICD-10-CM | POA: Diagnosis not present

## 2017-07-17 LAB — URINALYSIS, ROUTINE W REFLEX MICROSCOPIC
Bilirubin Urine: NEGATIVE
GLUCOSE, UA: NEGATIVE mg/dL
HGB URINE DIPSTICK: NEGATIVE
Ketones, ur: NEGATIVE mg/dL
Leukocytes, UA: NEGATIVE
Nitrite: NEGATIVE
PROTEIN: NEGATIVE mg/dL
Specific Gravity, Urine: 1.015 (ref 1.005–1.030)
pH: 6 (ref 5.0–8.0)

## 2017-07-17 LAB — AMNISURE RUPTURE OF MEMBRANE (ROM) NOT AT ARMC: AMNISURE: NEGATIVE

## 2017-07-17 MED ORDER — ACETAMINOPHEN 500 MG PO TABS
1000.0000 mg | ORAL_TABLET | Freq: Once | ORAL | Status: AC
  Administered 2017-07-17: 1000 mg via ORAL
  Filled 2017-07-17: qty 2

## 2017-07-17 MED ORDER — CYCLOBENZAPRINE HCL 10 MG PO TABS: 10.0000 mg | ORAL_TABLET | Freq: Two times a day (BID) | ORAL | 0 refills | Status: DC | PRN

## 2017-07-17 MED FILL — CYCLOBENZAPRINE 10 MG TAB: 10 | 10 days supply | Qty: 20 | Fill #0

## 2017-07-17 NOTE — MAU Provider Note (Signed)
History     CSN: [id number]  Arrival date and time: 07/17/17 1406   None     Chief Complaint  Patient presents with  . Back Pain   HPI   Ms.Anita [name] is a 36 y.o. female G1P0 @ [redacted]w[redacted]d with Kathi Der twins. States she started having lower back pain this morning around 0645. Around 1200 she started feeling more consistent back pain. She took 500 mg of tylenol which did not help the pain.  She feels the pain every 10 minutes. She currently rates the pain 5/10. No bleeding, Some leaking of discharge, unable to determine if she is leaking fluid. No recent intercourse.   OB History    Gravida Para Term Preterm AB Living   1             SAB TAB Ectopic Multiple Live Births                  Past Medical History:  Diagnosis Date  . Anxiety   . Heart murmur   . Seizures ([place])     Past Surgical History:  Procedure Laterality Date  . DILATATION AND CURETTAGE /HYSTEROSCOPY N/A 10/09/2015   Performed by Louretta Shorten, MD at [facility] ORS  . LAPAROSCOPY DIAGNOSTIC with CO 2 laser  WITH CHROMOPERTUBATION AND LASER OF ENDOMETRIOSIS WITH LYSIS OF ADHESIONS N/A 10/09/2015   Performed by Louretta Shorten, MD at [facility] ORS    Family History  Problem Relation Age of Onset  . Healthy Mother   . Healthy Father     Social History   Tobacco Use  . Smoking status: Never Smoker  . Smokeless tobacco: Never Used  Substance Use Topics  . Alcohol use: No  . Drug use: No    Allergies:  Allergies  Allergen Reactions  . Dye Fdc Red [Dye Fdc Red 3 (Erythrosine)] Nausea And Vomiting  . Flagyl [Metronidazole Hcl]     GI upset  . Morphine And Related Nausea And Vomiting    Medications Prior to Admission  Medication Sig Dispense Refill Last Dose  . calcium carbonate (TUMS - DOSED IN MG ELEMENTAL CALCIUM) 500 MG chewable tablet Chew 1 tablet 2 (two) times daily as needed by mouth for indigestion or heartburn.   07/16/2017 at Unknown time  . DICLEGIS 10-10 MG TBEC Take 2 tablets by mouth at bedtime as needed  (for nauesa).    07/16/2017 at Unknown time  . lacosamide (VIMPAT) 200 MG TABS tablet Take 1 tablet (200 mg total) by mouth at bedtime. 90 tablet 1 07/16/2017 at Unknown time  . LAMICTAL 200 MG tablet TAKE 1 & 1/2 TABLETS BY MOUTH TWICE DAILY 90 tablet 11 07/17/2017 at Unknown time  . omeprazole (PRILOSEC OTC) 20 MG tablet Take 20 mg daily as needed by mouth (heartburn).   07/16/2017 at Unknown time  . Prenatal Vit-Fe Fumarate-FA (PRENATAL MULTIVITAMIN) TABS tablet Take 1 tablet by mouth at bedtime.    07/16/2017 at Unknown time   Results for orders placed or performed during the hospital encounter of 07/17/17 (from the past 48 hour(s))  Urinalysis, Routine w reflex microscopic     Status: None   Collection Time: 07/17/17  1:35 PM  Result Value Ref Range   Color, Urine YELLOW YELLOW   APPearance CLEAR CLEAR   Specific Gravity, Urine 1.015 1.005 - 1.030   pH 6.0 5.0 - 8.0   Glucose, UA NEGATIVE NEGATIVE mg/dL   Hgb urine dipstick NEGATIVE NEGATIVE   Bilirubin Urine NEGATIVE NEGATIVE  Ketones, ur NEGATIVE NEGATIVE mg/dL   Protein, ur NEGATIVE NEGATIVE mg/dL   Nitrite NEGATIVE NEGATIVE   Leukocytes, UA NEGATIVE NEGATIVE  Amnisure rupture of membrane (rom)not at [facility]     Status: None   Collection Time: 07/17/17  2:52 PM  Result Value Ref Range   Amnisure ROM NEGATIVE    Review of Systems  Constitutional: Negative for fever.  Gastrointestinal: Negative for abdominal pain.  Genitourinary: Negative for dysuria and flank pain.  Musculoskeletal: Positive for back pain.   Physical Exam   Blood pressure 123/75, pulse 77, temperature 98.5 F (36.9 C), temperature source Oral, resp. rate 16, SpO2 99 %.  Physical Exam  Constitutional: She is oriented to person, place, and time. She appears well-developed and well-nourished. No distress.  HENT:  Head: Normocephalic.  Respiratory: Effort normal.  GI: Soft. She exhibits no distension. There is no tenderness. There is no rebound and no CVA  tenderness.  Genitourinary:  Genitourinary Comments: Cervix: closed, posterior Amnisure collected   Musculoskeletal: Normal range of motion.  Neurological: She is alert and oriented to person, place, and time.  Skin: Skin is warm. She is not diaphoretic.  Psychiatric: Her behavior is normal.   Fetal Tracing Fetus A Baseline: 130 bpm Variability: Moderate  Accelerations: 15x15 Decelerations: None Toco: Occasional UI   Fetal Tracing Fetus B Baseline: 120 bpm Variability: Moderate  Accelerations: 15x15 Decelerations: None   MAU Course  Procedures  None  MDM  Tylenol 1 gram given PO  NST reactive.  Minimal relief from tylenol, Patient agreeable to try flexeril at home.  Discussed with Dr. Royston Sinner, ok for DC home  Amnisure negative   Assessment and Plan   A:  1. Back pain affecting pregnancy in third trimester     P:  Discharge home with strict return precautions Rx: Flexeril Return to MAU if symptoms worsen Follow up with OB as scheduled or sooner if needed  Preterm labor precautions   Anita [name], Artist Pais, NP 07/17/2017 6:00 PM

## 2017-07-17 NOTE — MAU Note (Signed)
Pt started having back and abdominal pain this morning.  Was getting consistent pain later this afternoon.  Feeling vaginal pressure also. No bleeding, but is having some watery discharge.

## 2017-07-17 NOTE — Discharge Instructions (Signed)
Back Exercises If you have pain in your back, do these exercises 2-3 times each day or as told by your doctor. When the pain goes away, do the exercises once each day, but repeat the steps more times for each exercise (do more repetitions). If you do not have pain in your back, do these exercises once each day or as told by your doctor. Exercises Single Knee to Chest  Do these steps 3-5 times in a row for each leg: 1. Lie on your back on a firm bed or the floor with your legs stretched out. 2. Bring one knee to your chest. 3. Hold your knee to your chest by grabbing your knee or thigh. 4. Pull on your knee until you feel a gentle stretch in your lower back. 5. Keep doing the stretch for 10-30 seconds. 6. Slowly let go of your leg and straighten it.  Pelvic Tilt  Do these steps 5-10 times in a row: 1. Lie on your back on a firm bed or the floor with your legs stretched out. 2. Bend your knees so they point up to the ceiling. Your feet should be flat on the floor. 3. Tighten your lower belly (abdomen) muscles to press your lower back against the floor. This will make your tailbone point up to the ceiling instead of pointing down to your feet or the floor. 4. Stay in this position for 5-10 seconds while you gently tighten your muscles and breathe evenly.  Cat-Cow  Do these steps until your lower back bends more easily: 1. Get on your hands and knees on a firm surface. Keep your hands under your shoulders, and keep your knees under your hips. You may put padding under your knees. 2. Let your head hang down, and make your tailbone point down to the floor so your lower back is round like the back of a cat. 3. Stay in this position for 5 seconds. 4. Slowly lift your head and make your tailbone point up to the ceiling so your back hangs low (sags) like the back of a cow. 5. Stay in this position for 5 seconds.  Press-Ups  Do these steps 5-10 times in a row: 1. Lie on your belly (face-down)  on the floor. 2. Place your hands near your head, about shoulder-width apart. 3. While you keep your back relaxed and keep your hips on the floor, slowly straighten your arms to raise the top half of your body and lift your shoulders. Do not use your back muscles. To make yourself more comfortable, you may change where you place your hands. 4. Stay in this position for 5 seconds. 5. Slowly return to lying flat on the floor.  Bridges  Do these steps 10 times in a row: 1. Lie on your back on a firm surface. 2. Bend your knees so they point up to the ceiling. Your feet should be flat on the floor. 3. Tighten your butt muscles and lift your butt off of the floor until your waist is almost as high as your knees. If you do not feel the muscles working in your butt and the back of your thighs, slide your feet 1-2 inches farther away from your butt. 4. Stay in this position for 3-5 seconds. 5. Slowly lower your butt to the floor, and let your butt muscles relax.  If this exercise is too easy, try doing it with your arms crossed over your chest. Belly Crunches  Do these steps 5-10 times in  a row: 1. Lie on your back on a firm bed or the floor with your legs stretched out. 2. Bend your knees so they point up to the ceiling. Your feet should be flat on the floor. 3. Cross your arms over your chest. 4. Tip your chin a little bit toward your chest but do not bend your neck. 5. Tighten your belly muscles and slowly raise your chest just enough to lift your shoulder blades a tiny bit off of the floor. 6. Slowly lower your chest and your head to the floor.  Back Lifts Do these steps 5-10 times in a row: 1. Lie on your belly (face-down) with your arms at your sides, and rest your forehead on the floor. 2. Tighten the muscles in your legs and your butt. 3. Slowly lift your chest off of the floor while you keep your hips on the floor. Keep the back of your head in line with the curve in your back. Look at  the floor while you do this. 4. Stay in this position for 3-5 seconds. 5. Slowly lower your chest and your face to the floor.  Contact a doctor if:  Your back pain gets a lot worse when you do an exercise.  Your back pain does not lessen 2 hours after you exercise. If you have any of these problems, stop doing the exercises. Do not do them again unless your doctor says it is okay. Get help right away if:  You have sudden, very bad back pain. If this happens, stop doing the exercises. Do not do them again unless your doctor says it is okay. This information is not intended to replace advice given to you by your health care provider. Make sure you discuss any questions you have with your health care provider. Document Released: 09/17/2010 Document Revised: 01/21/2016 Document Reviewed: 10/09/2014 Elsevier Interactive Patient Education  2018 [employer].   Back Pain, Adult Back pain is very common in adults.The cause of back pain is rarely dangerous and the pain often gets better over time.The cause of your back pain may not be known. Some common causes of back pain include:  Strain of the muscles or ligaments supporting the spine.  Wear and tear (degeneration) of the spinal disks.  Arthritis.  Direct injury to the back.  For many people, back pain may return. Since back pain is rarely dangerous, most people can learn to manage this condition on their own. Follow these instructions at home: Watch your back pain for any changes. The following actions may help to lessen any discomfort you are feeling:  Remain active. It is stressful on your back to sit or stand in one place for long periods of time. Do not sit, drive, or stand in one place for more than 30 minutes at a time. Take short walks on even surfaces as soon as you are able.Try to increase the length of time you walk each day.  Exercise regularly as directed by your health care provider. Exercise helps your back heal  faster. It also helps avoid future injury by keeping your muscles strong and flexible.  Do not stay in bed.Resting more than 1-2 days can delay your recovery.  Pay attention to your body when you bend and lift. The most comfortable positions are those that put less stress on your recovering back. Always use proper lifting techniques, including: ? Bending your knees. ? Keeping the load close to your body. ? Avoiding twisting.  Find a comfortable position  to sleep. Use a firm mattress and lie on your side with your knees slightly bent. If you lie on your back, put a pillow under your knees.  Avoid feeling anxious or stressed.Stress increases muscle tension and can worsen back pain.It is important to recognize when you are anxious or stressed and learn ways to manage it, such as with exercise.  Take medicines only as directed by your health care provider. Over-the-counter medicines to reduce pain and inflammation are often the most helpful.Your health care provider may prescribe muscle relaxant drugs.These medicines help dull your pain so you can more quickly return to your normal activities and healthy exercise.  Apply ice to the injured area: ? Put ice in a plastic bag. ? Place a towel between your skin and the bag. ? Leave the ice on for 20 minutes, 2-3 times a day for the first 2-3 days. After that, ice and heat may be alternated to reduce pain and spasms.  Maintain a healthy weight. Excess weight puts extra stress on your back and makes it difficult to maintain good posture.  Contact a health care provider if:  You have pain that is not relieved with rest or medicine.  You have increasing pain going down into the legs or buttocks.  You have pain that does not improve in one week.  You have night pain.  You lose weight.  You have a fever or chills. Get help right away if:  You develop new bowel or bladder control problems.  You have unusual weakness or numbness in your  arms or legs.  You develop nausea or vomiting.  You develop abdominal pain.  You feel faint. This information is not intended to replace advice given to you by your health care provider. Make sure you discuss any questions you have with your health care provider. Document Released: 08/15/2005 Document Revised: 12/24/2015 Document Reviewed: 12/17/2013 Elsevier Interactive Patient Education  2017 [employer].

## 2017-07-24 MED FILL — ONDANSETRON HCL 8 MG TABLET: 8 | 10 days supply | Qty: 30 | Fill #0

## 2017-07-27 DIAGNOSIS — Z3A33 33 weeks gestation of pregnancy: Secondary | ICD-10-CM | POA: Diagnosis not present

## 2017-07-27 DIAGNOSIS — O30043 Twin pregnancy, dichorionic/diamniotic, third trimester: Secondary | ICD-10-CM | POA: Diagnosis not present

## 2017-07-27 DIAGNOSIS — Z3400 Encounter for supervision of normal first pregnancy, unspecified trimester: Secondary | ICD-10-CM | POA: Diagnosis not present

## 2017-08-02 ENCOUNTER — Ambulatory Visit (INDEPENDENT_AMBULATORY_CARE_PROVIDER_SITE_OTHER): Payer: 59 | Admitting: *Deleted

## 2017-08-02 VITALS — BP 128/74 | HR 72

## 2017-08-02 DIAGNOSIS — O30043 Twin pregnancy, dichorionic/diamniotic, third trimester: Secondary | ICD-10-CM | POA: Diagnosis not present

## 2017-08-02 MED FILL — VIMPAT 200 MG TABLET: 200 | 30 days supply | Qty: 30 | Fill #1

## 2017-08-02 NOTE — Progress Notes (Signed)
Copy of report and NST tracing sent to Dr. Lowe w/pt today.  

## 2017-08-08 MED FILL — LaMICtal 200 MG TABS: 200 | 30 days supply | Qty: 90 | Fill #5

## 2017-08-09 ENCOUNTER — Ambulatory Visit (INDEPENDENT_AMBULATORY_CARE_PROVIDER_SITE_OTHER): Payer: 59 | Admitting: *Deleted

## 2017-08-09 ENCOUNTER — Encounter (HOSPITAL_COMMUNITY): Payer: Self-pay

## 2017-08-09 VITALS — BP 132/79

## 2017-08-09 DIAGNOSIS — Z3400 Encounter for supervision of normal first pregnancy, unspecified trimester: Secondary | ICD-10-CM | POA: Diagnosis not present

## 2017-08-09 DIAGNOSIS — O30043 Twin pregnancy, dichorionic/diamniotic, third trimester: Secondary | ICD-10-CM | POA: Diagnosis not present

## 2017-08-09 DIAGNOSIS — O09293 Supervision of pregnancy with other poor reproductive or obstetric history, third trimester: Secondary | ICD-10-CM | POA: Diagnosis not present

## 2017-08-09 DIAGNOSIS — Z3480 Encounter for supervision of other normal pregnancy, unspecified trimester: Secondary | ICD-10-CM | POA: Diagnosis not present

## 2017-08-09 DIAGNOSIS — Z3A35 35 weeks gestation of pregnancy: Secondary | ICD-10-CM | POA: Diagnosis not present

## 2017-08-09 NOTE — Progress Notes (Addendum)
Pt reports occasional mild H/A - "like normal".  She denies visual disturbances. Copy of report and NST tracing sent to Dr. Corinna Capra w/pt today

## 2017-08-10 ENCOUNTER — Other Ambulatory Visit: Payer: 59

## 2017-08-10 ENCOUNTER — Telehealth (HOSPITAL_COMMUNITY): Payer: Self-pay | Admitting: *Deleted

## 2017-08-10 NOTE — Telephone Encounter (Signed)
Preadmission screen  

## 2017-08-14 ENCOUNTER — Encounter (HOSPITAL_COMMUNITY): Payer: Self-pay

## 2017-08-14 ENCOUNTER — Telehealth (HOSPITAL_COMMUNITY): Payer: Self-pay | Admitting: *Deleted

## 2017-08-14 NOTE — Telephone Encounter (Signed)
Preadmission screen  

## 2017-08-15 ENCOUNTER — Encounter (HOSPITAL_COMMUNITY): Payer: Self-pay | Admitting: *Deleted

## 2017-08-15 ENCOUNTER — Inpatient Hospital Stay (HOSPITAL_COMMUNITY)
Admission: AD | Admit: 2017-08-15 | Discharge: 2017-08-15 | Disposition: A | Discharge status: Home / Self Care | Payer: 59 | Source: Ambulatory Visit | Attending: Obstetrics and Gynecology | Admitting: Obstetrics and Gynecology

## 2017-08-15 ENCOUNTER — Other Ambulatory Visit: Payer: Self-pay

## 2017-08-15 DIAGNOSIS — Z3483 Encounter for supervision of other normal pregnancy, third trimester: Secondary | ICD-10-CM | POA: Diagnosis not present

## 2017-08-15 DIAGNOSIS — Z3A36 36 weeks gestation of pregnancy: Secondary | ICD-10-CM | POA: Diagnosis not present

## 2017-08-15 DIAGNOSIS — O479 False labor, unspecified: Secondary | ICD-10-CM

## 2017-08-15 LAB — URINALYSIS, ROUTINE W REFLEX MICROSCOPIC
BILIRUBIN URINE: NEGATIVE
Glucose, UA: NEGATIVE mg/dL
HGB URINE DIPSTICK: NEGATIVE
Ketones, ur: NEGATIVE mg/dL
LEUKOCYTES UA: NEGATIVE
NITRITE: NEGATIVE
PROTEIN: 30 mg/dL — AB
Specific Gravity, Urine: 1.013 (ref 1.005–1.030)
pH: 8 (ref 5.0–8.0)

## 2017-08-15 MED ORDER — ONDANSETRON 8 MG PO TBDP
8.0000 mg | ORAL_TABLET | Freq: Once | ORAL | Status: AC
  Administered 2017-08-15: 8 mg via ORAL
  Filled 2017-08-15: qty 1

## 2017-08-15 MED FILL — ONDANSETRON ODT 8 MG TABLET: 8 | 7 days supply | Qty: 20 | Fill #0

## 2017-08-15 NOTE — Progress Notes (Signed)
Dr. Matthew Saras informed of reactive FHR tracings, cervix unchanged, uncomfortable with contractions, requesting zofran for nausea.  Zofran order received, to continue monitoring pt to see if nausea relieved.

## 2017-08-15 NOTE — MAU Note (Addendum)
Received report on patient from Fransisco Hertz, RN at 1100.

## 2017-08-15 NOTE — MAU Note (Signed)
Pt presents with c/o headaches unrelieved with Tylenol, increased swelling and ctxs that began yesterday that have increased in frequency & intensity.  Denies LOF and VB.  Reports +FM.

## 2017-08-15 NOTE — Discharge Instructions (Signed)
Fetal Movement Counts °Patient Name: ________________________________________________ Patient Due Date: ____________________ °What is a fetal movement count? °A fetal movement count is the number of times that you feel your baby move during a certain amount of time. This may also be called a fetal kick count. A fetal movement count is recommended for every pregnant woman. You may be asked to start counting fetal movements as early as week 28 of your pregnancy. °Pay attention to when your baby is most active. You may notice your baby's sleep and wake cycles. You may also notice things that make your baby move more. You should do a fetal movement count: °· When your baby is normally most active. °· At the same time each day. ° °A good time to count movements is while you are resting, after having something to eat and drink. °How do I count fetal movements? °1. Find a quiet, comfortable area. Sit, or lie down on your side. °2. Write down the date, the start time and stop time, and the number of movements that you felt between those two times. Take this information with you to your health care visits. °3. For 2 hours, count kicks, flutters, swishes, rolls, and jabs. You should feel at least 10 movements during 2 hours. °4. You may stop counting after you have felt 10 movements. °5. If you do not feel 10 movements in 2 hours, have something to eat and drink. Then, keep resting and counting for 1 hour. If you feel at least 4 movements during that hour, you may stop counting. °Contact a health care provider if: °· You feel fewer than 4 movements in 2 hours. °· Your baby is not moving like he or she usually does. °Date: ____________ Start time: ____________ Stop time: ____________ Movements: ____________ °Date: ____________ Start time: ____________ Stop time: ____________ Movements: ____________ °Date: ____________ Start time: ____________ Stop time: ____________ Movements: ____________ °Date: ____________ Start time:  ____________ Stop time: ____________ Movements: ____________ °Date: ____________ Start time: ____________ Stop time: ____________ Movements: ____________ °Date: ____________ Start time: ____________ Stop time: ____________ Movements: ____________ °Date: ____________ Start time: ____________ Stop time: ____________ Movements: ____________ °Date: ____________ Start time: ____________ Stop time: ____________ Movements: ____________ °Date: ____________ Start time: ____________ Stop time: ____________ Movements: ____________ °This information is not intended to replace advice given to you by your health care provider. Make sure you discuss any questions you have with your health care provider. °Document Released: 09/14/2006 Document Revised: 04/13/2016 Document Reviewed: 09/24/2015 °Elsevier Interactive Patient Education © 2018 Else[employer] Inc[employer] °Braxton Hicks Contractions °Contractions of the uterus can occur throughout pregnancy, but they are not always a sign that you are in labor. You may have practice contractions called Braxton Hicks contractions. These false labor contractions are sometimes confused with true labor. °What are Braxton Hicks contractions? °Braxton Hicks contractions are tightening movements that occur in the muscles of the uterus before labor. Unlike true labor contractions, these contractions do not result in opening (dilation) and thinning of the cervix. Toward the end of pregnancy (32-34 weeks), Braxton Hicks contractions can happen more often and may become stronger. These contractions are sometimes difficult to tell apart from true labor because they can be very uncomfortable. You should not feel embarrassed if you go to the hospital with false labor. °Sometimes, the only way to tell if you are in true labor is for your health care provider to look for changes in the cervix. The health care provider will do a physical exam and may monitor your contractions. If   you are not in true labor, the exam  should show that your cervix is not dilating and your water has not broken. °If there are no prenatal problems or other health problems associated with your pregnancy, it is completely safe for you to be sent home with false labor. You may continue to have Braxton Hicks contractions until you go into true labor. °How can I tell the difference between true labor and false labor? °· Differences °? False labor °? Contractions last 30-70 seconds.: Contractions are usually shorter and not as strong as true labor contractions. °? Contractions become very regular.: Contractions are usually irregular. °? Discomfort is usually felt in the top of the uterus, and it spreads to the lower abdomen and low back.: Contractions are often felt in the front of the lower abdomen and in the groin. °? Contractions do not go away with walking.: Contractions may go away when you walk around or change positions while lying down. °? Contractions usually become more intense and increase in frequency.: Contractions get weaker and are shorter-lasting as time goes on. °? The cervix dilates and gets thinner.: The cervix usually does not dilate or become thin. °Follow these instructions at home: °· Take over-the-counter and prescription medicines only as told by your health care provider. °· Keep up with your usual exercises and follow other instructions from your health care provider. °· Eat and drink lightly if you think you are going into labor. °· If Braxton Hicks contractions are making you uncomfortable: °? Change your position from lying down or resting to walking, or change from walking to resting. °? Sit and rest in a tub of warm water. °? Drink enough fluid to keep your urine clear or pale yellow. Dehydration may cause these contractions. °? Do slow and deep breathing several times an hour. °· Keep all follow-up prenatal visits as told by your health care provider. This is important. °Contact a health care provider if: °· You have a  fever. °· You have continuous pain in your abdomen. °Get help right away if: °· Your contractions become stronger, more regular, and closer together. °· You have fluid leaking or gushing from your vagina. °· You pass blood-tinged mucus (bloody show). °· You have bleeding from your vagina. °· You have low back pain that you never had before. °· You feel your baby’s head pushing down and causing pelvic pressure. °· Your baby is not moving inside you as much as it used to. °Summary °· Contractions that occur before labor are called Braxton Hicks contractions, false labor, or practice contractions. °· Braxton Hicks contractions are usually shorter, weaker, farther apart, and less regular than true labor contractions. True labor contractions usually become progressively stronger and regular and they become more frequent. °· Manage discomfort from Braxton Hicks contractions by changing position, resting in a warm bath, drinking plenty of water, or practicing deep breathing. °This information is not intended to replace advice given to you by your health care provider. Make sure you discuss any questions you have with your health care provider. °Document Released: 08/15/2005 Document Revised: 07/04/2016 Document Reviewed: 07/04/2016 °Elsevier Interactive Patient Education © 2017 Elsevier Inc. ° °

## 2017-08-15 NOTE — Progress Notes (Signed)
Dr. Matthew Saras informed of pt's C/O HA not relieved with tylenol, increased swelling, contractions, twin gestation, SVE closed, BP readings.  MD requests pt be rechecked in one hour.

## 2017-08-16 ENCOUNTER — Ambulatory Visit (INDEPENDENT_AMBULATORY_CARE_PROVIDER_SITE_OTHER): Payer: 59 | Admitting: *Deleted

## 2017-08-16 VITALS — BP 126/78 | HR 68

## 2017-08-16 DIAGNOSIS — O30043 Twin pregnancy, dichorionic/diamniotic, third trimester: Secondary | ICD-10-CM | POA: Diagnosis not present

## 2017-08-16 NOTE — Progress Notes (Signed)
Copy of report and NST tracing sent to Dr. Lowe w/pt today.  

## 2017-08-23 NOTE — Patient Instructions (Signed)
KELAIAH ESCALONA  08/23/2017   Your procedure is scheduled on:  08/25/2018  Enter through the Main Entrance of [facility] at [place] up the phone at the desk and dial [phone]  Call this number if you have problems the morning of surgery:[phone]  Remember:   Do not eat food:After Midnight.  Do not drink clear liquids: After Midnight.  Take these medicines the morning of surgery with A SIP OF WATER: take your lamictal and lacosamide as usual.   Do not wear jewelry, make-up or nail polish.  Do not wear lotions, powders, or perfumes. Do not wear deodorant.  Do not shave 48 hours prior to surgery.  Do not bring valuables to the hospital.  [facility] is not   responsible for any belongings or valuables brought to the hospital.  Contacts, dentures or bridgework may not be worn into surgery.  Leave suitcase in the car. After surgery it may be brought to your room.  For patients admitted to the hospital, checkout time is 11:00 AM the day of              discharge.    N/A   Please read over the following fact sheets that you were given:   Surgical Site Infection Prevention

## 2017-08-24 ENCOUNTER — Encounter (HOSPITAL_COMMUNITY)
Admission: RE | Admit: 2017-08-24 | Discharge: 2017-08-24 | Disposition: A | Discharge status: Home / Self Care | Payer: 59 | Source: Ambulatory Visit | Attending: Obstetrics and Gynecology | Admitting: Obstetrics and Gynecology

## 2017-08-24 DIAGNOSIS — O328XX2 Maternal care for other malpresentation of fetus, fetus 2: Secondary | ICD-10-CM | POA: Diagnosis not present

## 2017-08-24 DIAGNOSIS — N3289 Other specified disorders of bladder: Secondary | ICD-10-CM | POA: Diagnosis not present

## 2017-08-24 DIAGNOSIS — O328XX1 Maternal care for other malpresentation of fetus, fetus 1: Secondary | ICD-10-CM | POA: Diagnosis not present

## 2017-08-24 DIAGNOSIS — E669 Obesity, unspecified: Secondary | ICD-10-CM | POA: Diagnosis not present

## 2017-08-24 DIAGNOSIS — D62 Acute posthemorrhagic anemia: Secondary | ICD-10-CM | POA: Diagnosis not present

## 2017-08-24 DIAGNOSIS — Z302 Encounter for sterilization: Secondary | ICD-10-CM | POA: Diagnosis not present

## 2017-08-24 DIAGNOSIS — O9081 Anemia of the puerperium: Secondary | ICD-10-CM | POA: Diagnosis not present

## 2017-08-24 DIAGNOSIS — O99214 Obesity complicating childbirth: Secondary | ICD-10-CM | POA: Diagnosis not present

## 2017-08-24 LAB — CBC
HEMATOCRIT: 34.3 % — AB (ref 36.0–46.0)
HEMOGLOBIN: 11.6 g/dL — AB (ref 12.0–15.0)
MCH: 29.8 pg (ref 26.0–34.0)
MCHC: 33.8 g/dL (ref 30.0–36.0)
MCV: 88.2 fL (ref 78.0–100.0)
Platelets: 299 10*3/uL (ref 150–400)
RBC: 3.89 MIL/uL (ref 3.87–5.11)
RDW: 14.2 % (ref 11.5–15.5)
WBC: 6.9 10*3/uL (ref 4.0–10.5)

## 2017-08-24 LAB — TYPE AND SCREEN
ABO/RH(D): O POS
Antibody Screen: NEGATIVE

## 2017-08-25 ENCOUNTER — Encounter (HOSPITAL_COMMUNITY): Payer: Self-pay

## 2017-08-25 ENCOUNTER — Encounter (HOSPITAL_COMMUNITY)
Admission: AD | Disposition: A | Discharge status: Home / Self Care | Payer: Self-pay | Source: Ambulatory Visit | Attending: Obstetrics and Gynecology

## 2017-08-25 ENCOUNTER — Inpatient Hospital Stay (HOSPITAL_COMMUNITY)
Admission: AD | Admit: 2017-08-25 | Discharge: 2017-08-28 | DRG: 784 | Disposition: A | Discharge status: Home / Self Care | Payer: 59 | Source: Ambulatory Visit | Attending: Obstetrics and Gynecology | Admitting: Obstetrics and Gynecology

## 2017-08-25 ENCOUNTER — Inpatient Hospital Stay (HOSPITAL_COMMUNITY): Payer: 59 | Admitting: Anesthesiology

## 2017-08-25 DIAGNOSIS — O9081 Anemia of the puerperium: Secondary | ICD-10-CM | POA: Diagnosis not present

## 2017-08-25 DIAGNOSIS — D62 Acute posthemorrhagic anemia: Secondary | ICD-10-CM | POA: Diagnosis not present

## 2017-08-25 DIAGNOSIS — O30043 Twin pregnancy, dichorionic/diamniotic, third trimester: Secondary | ICD-10-CM | POA: Diagnosis present

## 2017-08-25 DIAGNOSIS — O321XX2 Maternal care for breech presentation, fetus 2: Secondary | ICD-10-CM | POA: Diagnosis not present

## 2017-08-25 DIAGNOSIS — O99214 Obesity complicating childbirth: Secondary | ICD-10-CM | POA: Diagnosis present

## 2017-08-25 DIAGNOSIS — O328XX1 Maternal care for other malpresentation of fetus, fetus 1: Principal | ICD-10-CM | POA: Diagnosis present

## 2017-08-25 DIAGNOSIS — Z3A38 38 weeks gestation of pregnancy: Secondary | ICD-10-CM

## 2017-08-25 DIAGNOSIS — N3289 Other specified disorders of bladder: Secondary | ICD-10-CM | POA: Diagnosis not present

## 2017-08-25 DIAGNOSIS — E669 Obesity, unspecified: Secondary | ICD-10-CM | POA: Diagnosis present

## 2017-08-25 DIAGNOSIS — O328XX2 Maternal care for other malpresentation of fetus, fetus 2: Secondary | ICD-10-CM | POA: Diagnosis present

## 2017-08-25 DIAGNOSIS — O321XX0 Maternal care for breech presentation, not applicable or unspecified: Secondary | ICD-10-CM | POA: Diagnosis not present

## 2017-08-25 DIAGNOSIS — Z302 Encounter for sterilization: Secondary | ICD-10-CM | POA: Diagnosis not present

## 2017-08-25 DIAGNOSIS — O321XX1 Maternal care for breech presentation, fetus 1: Secondary | ICD-10-CM | POA: Diagnosis not present

## 2017-08-25 LAB — RPR: RPR: NONREACTIVE

## 2017-08-25 SURGERY — Surgical Case
Anesthesia: Spinal

## 2017-08-25 MED ORDER — MORPHINE SULFATE (PF) 0.5 MG/ML IJ SOLN
INTRAMUSCULAR | Status: DC | PRN
  Administered 2017-08-25: .2 mg via INTRATHECAL

## 2017-08-25 MED ORDER — TETANUS-DIPHTH-ACELL PERTUSSIS 5-2.5-18.5 LF-MCG/0.5 IM SUSP: 0.5000 mL | Freq: Once | INTRAMUSCULAR | Status: DC

## 2017-08-25 MED ORDER — SOD CITRATE-CITRIC ACID 500-334 MG/5ML PO SOLN
30.0000 mL | Freq: Once | ORAL | Status: AC
  Administered 2017-08-25: 30 mL via ORAL
  Filled 2017-08-25: qty 15

## 2017-08-25 MED ORDER — IBUPROFEN 600 MG PO TABS
600.0000 mg | ORAL_TABLET | Freq: Four times a day (QID) | ORAL | Status: DC
  Administered 2017-08-27 – 2017-08-28 (×5): 600 mg via ORAL
  Filled 2017-08-25 (×8): qty 1

## 2017-08-25 MED ORDER — SODIUM CHLORIDE 0.9% FLUSH: 3.0000 mL | INTRAVENOUS | Status: DC | PRN

## 2017-08-25 MED ORDER — ONDANSETRON HCL 4 MG/2ML IJ SOLN
INTRAMUSCULAR | Status: AC
  Filled 2017-08-25: qty 2

## 2017-08-25 MED ORDER — SIMETHICONE 80 MG PO CHEW
80.0000 mg | CHEWABLE_TABLET | ORAL | Status: DC
  Administered 2017-08-26 – 2017-08-27 (×2): 80 mg via ORAL
  Filled 2017-08-25 (×3): qty 1

## 2017-08-25 MED ORDER — NALOXONE HCL 0.4 MG/ML IJ SOLN: 1.0000 ug/kg/h | INTRAVENOUS | Status: DC | PRN

## 2017-08-25 MED ORDER — NALBUPHINE HCL 10 MG/ML IJ SOLN: 5.0000 mg | Freq: Once | INTRAMUSCULAR | Status: DC | PRN

## 2017-08-25 MED ORDER — LAMOTRIGINE 200 MG PO TABS
200.0000 mg | ORAL_TABLET | Freq: Every day | ORAL | Status: DC
  Filled 2017-08-25: qty 1

## 2017-08-25 MED ORDER — DIBUCAINE 1 % RE OINT: 1.0000 "application " | TOPICAL_OINTMENT | RECTAL | Status: DC | PRN

## 2017-08-25 MED ORDER — NALOXONE HCL 0.4 MG/ML IJ SOLN: 0.4000 mg | INTRAMUSCULAR | Status: DC | PRN

## 2017-08-25 MED ORDER — PHENYLEPHRINE 8 MG IN D5W 100 ML (0.08MG/ML) PREMIX OPTIME
INJECTION | INTRAVENOUS | Status: AC
  Filled 2017-08-25: qty 100

## 2017-08-25 MED ORDER — FENTANYL CITRATE (PF) 100 MCG/2ML IJ SOLN: 25.0000 ug | INTRAMUSCULAR | Status: DC | PRN

## 2017-08-25 MED ORDER — ONDANSETRON HCL 4 MG/2ML IJ SOLN
4.0000 mg | Freq: Three times a day (TID) | INTRAMUSCULAR | Status: DC | PRN
  Administered 2017-08-25: 4 mg via INTRAVENOUS

## 2017-08-25 MED ORDER — ACETAMINOPHEN 325 MG PO TABS
650.0000 mg | ORAL_TABLET | ORAL | Status: DC | PRN
  Administered 2017-08-26 – 2017-08-28 (×7): 650 mg via ORAL
  Filled 2017-08-25 (×7): qty 2

## 2017-08-25 MED ORDER — KETOROLAC TROMETHAMINE 30 MG/ML IJ SOLN
30.0000 mg | Freq: Four times a day (QID) | INTRAMUSCULAR | Status: AC | PRN
  Administered 2017-08-25 – 2017-08-26 (×2): 30 mg via INTRAVENOUS
  Filled 2017-08-25 (×2): qty 1

## 2017-08-25 MED ORDER — LACOSAMIDE 200 MG PO TABS
200.0000 mg | ORAL_TABLET | Freq: Every day | ORAL | Status: DC
  Administered 2017-08-25 – 2017-08-27 (×3): 200 mg via ORAL
  Filled 2017-08-25 (×3): qty 1

## 2017-08-25 MED ORDER — DIPHENHYDRAMINE HCL 50 MG/ML IJ SOLN: 12.5000 mg | INTRAMUSCULAR | Status: DC | PRN

## 2017-08-25 MED ORDER — MEPERIDINE HCL 25 MG/ML IJ SOLN: 6.2500 mg | INTRAMUSCULAR | Status: DC | PRN

## 2017-08-25 MED ORDER — KETOROLAC TROMETHAMINE 30 MG/ML IJ SOLN
INTRAMUSCULAR | Status: AC
  Filled 2017-08-25: qty 1

## 2017-08-25 MED ORDER — ACETAMINOPHEN 500 MG PO TABS
1000.0000 mg | ORAL_TABLET | Freq: Four times a day (QID) | ORAL | Status: AC
  Administered 2017-08-25 – 2017-08-26 (×4): 1000 mg via ORAL
  Filled 2017-08-25 (×4): qty 2

## 2017-08-25 MED ORDER — OXYTOCIN 10 UNIT/ML IJ SOLN
INTRAMUSCULAR | Status: DC | PRN
  Administered 2017-08-25: 40 [IU] via INTRAVENOUS

## 2017-08-25 MED ORDER — DIPHENHYDRAMINE HCL 25 MG PO CAPS: 25.0000 mg | ORAL_CAPSULE | ORAL | Status: DC | PRN

## 2017-08-25 MED ORDER — SCOPOLAMINE 1 MG/3DAYS TD PT72: 1.0000 | MEDICATED_PATCH | Freq: Once | TRANSDERMAL | Status: DC

## 2017-08-25 MED ORDER — OXYTOCIN 10 UNIT/ML IJ SOLN
INTRAMUSCULAR | Status: AC
  Filled 2017-08-25: qty 4

## 2017-08-25 MED ORDER — LACTATED RINGERS IV SOLN
INTRAVENOUS | Status: DC | PRN
  Administered 2017-08-25: 11:00:00 via INTRAVENOUS

## 2017-08-25 MED ORDER — DEXAMETHASONE SODIUM PHOSPHATE 10 MG/ML IJ SOLN
INTRAMUSCULAR | Status: DC | PRN
  Administered 2017-08-25: 5 mg via INTRAVENOUS

## 2017-08-25 MED ORDER — COCONUT OIL OIL: 1.0000 "application " | TOPICAL_OIL | Status: DC | PRN

## 2017-08-25 MED ORDER — SIMETHICONE 80 MG PO CHEW
80.0000 mg | CHEWABLE_TABLET | ORAL | Status: DC | PRN
  Administered 2017-08-26 – 2017-08-27 (×2): 80 mg via ORAL

## 2017-08-25 MED ORDER — MORPHINE SULFATE (PF) 0.5 MG/ML IJ SOLN
INTRAMUSCULAR | Status: AC
Start: 2017-08-25 — End: 2017-08-25
  Filled 2017-08-25: qty 10

## 2017-08-25 MED ORDER — NALBUPHINE HCL 10 MG/ML IJ SOLN: 5.0000 mg | INTRAMUSCULAR | Status: DC | PRN

## 2017-08-25 MED ORDER — LAMOTRIGINE 150 MG PO TABS
300.0000 mg | ORAL_TABLET | Freq: Two times a day (BID) | ORAL | Status: DC
  Administered 2017-08-25 – 2017-08-28 (×6): 300 mg via ORAL
  Filled 2017-08-25 (×7): qty 2

## 2017-08-25 MED ORDER — DIPHENHYDRAMINE HCL 25 MG PO CAPS: 25.0000 mg | ORAL_CAPSULE | Freq: Four times a day (QID) | ORAL | Status: DC | PRN

## 2017-08-25 MED ORDER — BUPIVACAINE IN DEXTROSE 0.75-8.25 % IT SOLN
INTRATHECAL | Status: DC | PRN
  Administered 2017-08-25: 1.6 mL via INTRATHECAL

## 2017-08-25 MED ORDER — PRENATAL MULTIVITAMIN CH
1.0000 | ORAL_TABLET | Freq: Every day | ORAL | Status: DC
  Administered 2017-08-28: 1 via ORAL
  Filled 2017-08-25 (×2): qty 1

## 2017-08-25 MED ORDER — LACTATED RINGERS IV SOLN
INTRAVENOUS | Status: DC
  Administered 2017-08-25 (×3): via INTRAVENOUS

## 2017-08-25 MED ORDER — OXYTOCIN 40 UNITS IN LACTATED RINGERS INFUSION - SIMPLE MED: 2.5000 [IU]/h | INTRAVENOUS | Status: AC

## 2017-08-25 MED ORDER — OXYCODONE HCL 5 MG PO TABS
10.0000 mg | ORAL_TABLET | ORAL | Status: DC | PRN
Start: 2017-08-25 — End: 2017-08-28
  Administered 2017-08-26 – 2017-08-27 (×5): 10 mg via ORAL
  Filled 2017-08-25 (×5): qty 2

## 2017-08-25 MED ORDER — CEFOTETAN DISODIUM-DEXTROSE 2-2.08 GM-%(50ML) IV SOLR
2.0000 g | INTRAVENOUS | Status: AC
  Administered 2017-08-25: 2 g via INTRAVENOUS
  Filled 2017-08-25: qty 50

## 2017-08-25 MED ORDER — ZOLPIDEM TARTRATE 5 MG PO TABS: 5.0000 mg | ORAL_TABLET | Freq: Every evening | ORAL | Status: DC | PRN

## 2017-08-25 MED ORDER — OXYCODONE HCL 5 MG PO TABS
5.0000 mg | ORAL_TABLET | ORAL | Status: DC | PRN
Start: 2017-08-25 — End: 2017-08-26
  Administered 2017-08-26: 5 mg via ORAL
  Filled 2017-08-25: qty 1

## 2017-08-25 MED ORDER — KETOROLAC TROMETHAMINE 30 MG/ML IJ SOLN
30.0000 mg | Freq: Four times a day (QID) | INTRAMUSCULAR | Status: AC | PRN
  Administered 2017-08-25: 30 mg via INTRAMUSCULAR

## 2017-08-25 MED ORDER — ONDANSETRON HCL 4 MG/2ML IJ SOLN
INTRAMUSCULAR | Status: DC | PRN
  Administered 2017-08-25: 4 mg via INTRAVENOUS

## 2017-08-25 MED ORDER — FENTANYL CITRATE (PF) 100 MCG/2ML IJ SOLN
INTRAMUSCULAR | Status: AC
  Filled 2017-08-25: qty 2

## 2017-08-25 MED ORDER — FENTANYL CITRATE (PF) 100 MCG/2ML IJ SOLN
INTRAMUSCULAR | Status: DC | PRN
  Administered 2017-08-25: 10 ug via INTRATHECAL

## 2017-08-25 MED ORDER — WITCH HAZEL-GLYCERIN EX PADS: 1.0000 "application " | MEDICATED_PAD | CUTANEOUS | Status: DC | PRN

## 2017-08-25 MED ORDER — SENNOSIDES-DOCUSATE SODIUM 8.6-50 MG PO TABS
2.0000 | ORAL_TABLET | ORAL | Status: DC
  Administered 2017-08-26 – 2017-08-27 (×3): 2 via ORAL
  Filled 2017-08-25 (×3): qty 2

## 2017-08-25 MED ORDER — MENTHOL 3 MG MT LOZG: 1.0000 | LOZENGE | OROMUCOSAL | Status: DC | PRN

## 2017-08-25 MED ORDER — SCOPOLAMINE 1 MG/3DAYS TD PT72
1.0000 | MEDICATED_PATCH | Freq: Once | TRANSDERMAL | Status: DC
  Administered 2017-08-25: 1.5 mg via TRANSDERMAL
  Filled 2017-08-25: qty 1

## 2017-08-25 MED ORDER — FAMOTIDINE 20 MG PO TABS
20.0000 mg | ORAL_TABLET | Freq: Once | ORAL | Status: AC
  Administered 2017-08-25: 20 mg via ORAL
  Filled 2017-08-25: qty 1

## 2017-08-25 MED ORDER — PROMETHAZINE HCL 25 MG/ML IJ SOLN
6.2500 mg | INTRAMUSCULAR | Status: DC | PRN
Start: 2017-08-25 — End: 2017-08-25

## 2017-08-25 MED ORDER — PHENYLEPHRINE 8 MG IN D5W 100 ML (0.08MG/ML) PREMIX OPTIME
INJECTION | INTRAVENOUS | Status: DC | PRN
  Administered 2017-08-25: 60 ug/min via INTRAVENOUS

## 2017-08-25 MED ORDER — LACTATED RINGERS IV SOLN
INTRAVENOUS | Status: DC
  Administered 2017-08-25: 20:00:00 via INTRAVENOUS

## 2017-08-25 MED ORDER — SIMETHICONE 80 MG PO CHEW
80.0000 mg | CHEWABLE_TABLET | Freq: Three times a day (TID) | ORAL | Status: DC
  Administered 2017-08-25 – 2017-08-28 (×5): 80 mg via ORAL
  Filled 2017-08-25 (×6): qty 1

## 2017-08-25 MED ORDER — MEASLES, MUMPS & RUBELLA VAC ~~LOC~~ INJ: 0.5000 mL | INJECTION | Freq: Once | SUBCUTANEOUS | Status: DC

## 2017-08-25 SURGICAL SUPPLY — 31 items
APL SKNCLS STERI-STRIP NONHPOA (GAUZE/BANDAGES/DRESSINGS) ×1
BENZOIN TINCTURE PRP APPL 2/3 (GAUZE/BANDAGES/DRESSINGS) ×2 IMPLANT
CHLORAPREP W/TINT 26ML (MISCELLANEOUS) ×2 IMPLANT
CLAMP CORD UMBIL (MISCELLANEOUS) ×4 IMPLANT
CLIP FILSHIE TUBAL LIGA STRL (Clip) ×2 IMPLANT
CLOSURE WOUND 1/2 X4 (GAUZE/BANDAGES/DRESSINGS) ×1
CLOTH BEACON ORANGE TIMEOUT ST (SAFETY) ×2 IMPLANT
DRSG OPSITE POSTOP 4X10 (GAUZE/BANDAGES/DRESSINGS) ×2 IMPLANT
ELECT REM PT RETURN 9FT ADLT (ELECTROSURGICAL) ×3
ELECTRODE REM PT RTRN 9FT ADLT (ELECTROSURGICAL) IMPLANT
GLOVE BIOGEL PI IND STRL 7.0 (GLOVE) IMPLANT
GLOVE BIOGEL PI INDICATOR 7.0 (GLOVE) ×8
GLOVE SURG ORTHO 8.0 STRL STRW (GLOVE) ×2 IMPLANT
GOWN STRL REUS W/TWL LRG LVL3 (GOWN DISPOSABLE) ×6 IMPLANT
KIT ABG SYR 3ML LUER SLIP (SYRINGE) ×4 IMPLANT
NDL HYPO 25X5/8 SAFETYGLIDE (NEEDLE) IMPLANT
NEEDLE HYPO 25X5/8 SAFETYGLIDE (NEEDLE) ×3 IMPLANT
NS IRRIG 1000ML POUR BTL (IV SOLUTION) ×2 IMPLANT
PACK C SECTION WH (CUSTOM PROCEDURE TRAY) ×2 IMPLANT
PAD OB MATERNITY 4.3X12.25 (PERSONAL CARE ITEMS) ×4 IMPLANT
PENCIL SMOKE EVAC W/HOLSTER (ELECTROSURGICAL) ×2 IMPLANT
RETRACTOR WND ALEXIS 25 LRG (MISCELLANEOUS) IMPLANT
RTRCTR WOUND ALEXIS 25CM LRG (MISCELLANEOUS) ×3
STRIP CLOSURE SKIN 1/2X4 (GAUZE/BANDAGES/DRESSINGS) ×1 IMPLANT
SUT MNCRL 0 VIOLET CTX 36 (SUTURE) IMPLANT
SUT MON AB 4-0 PS1 27 (SUTURE) ×4 IMPLANT
SUT MONOCRYL 0 CTX 36 (SUTURE) ×6
SUT VIC AB 1 CTX 36 (SUTURE) ×3
SUT VIC AB 1 CTX36XBRD ANBCTRL (SUTURE) IMPLANT
TOWEL OR 17X24 6PK STRL BLUE (TOWEL DISPOSABLE) ×6 IMPLANT
TRAY FOLEY BAG SILVER LF 14FR (SET/KITS/TRAYS/PACK) ×2 IMPLANT

## 2017-08-25 NOTE — Transfer of Care (Signed)
Immediate Anesthesia Transfer of Care Note  Patient: Anita [name]  Procedure(s) Performed: CESAREAN SECTION MULTI-GESTATIONAL WITH TUBAL (N/A )  Patient Location: PACU  Anesthesia Type:Spinal  Level of Consciousness: awake  Airway & Oxygen Therapy: Patient Spontanous Breathing  Post-op Assessment: Report given to RN  Post vital signs: Reviewed and stable  Last Vitals:  Vitals:   08/25/17 0758  BP: 128/88  Pulse: 82  Resp: 16  Temp: 36.6 C    Last Pain:  Vitals:   08/25/17 0758  TempSrc: Oral         Complications: No apparent anesthesia complications

## 2017-08-25 NOTE — Anesthesia Procedure Notes (Signed)
Spinal  Patient location during procedure: OR Start time: 08/25/2017 10:41 AM End time: 08/25/2017 10:43 AM Staffing Anesthesiologist: [name], MD Performed: anesthesiologist  Preanesthetic Checklist Completed: patient identified, surgical consent, pre-op evaluation, timeout performed, IV checked, risks and benefits discussed and monitors and equipment checked Spinal Block Patient position: sitting Prep: site prepped and draped and DuraPrep Patient monitoring: continuous pulse ox and blood pressure Approach: midline Location: L3-4 Injection technique: single-shot Needle Needle type: Pencan  Needle gauge: 24 G Assessment Sensory level: T4 Additional Notes Functioning IV was confirmed and monitors were applied. Sterile prep and drape, including hand hygiene, mask and sterile gloves were used. The patient was positioned and the spine was prepped. The skin was anesthetized with lidocaine.  Free flow of clear CSF was obtained prior to injecting local anesthetic into the CSF.  The spinal needle aspirated freely following injection.  The needle was carefully withdrawn.  The patient tolerated the procedure well. Consent was obtained prior to procedure with all questions answered and concerns addressed. Risks including but not limited to bleeding, infection, nerve damage, paralysis, failed block, inadequate analgesia, allergic reaction, high spinal, itching and headache were discussed and the patient wished to proceed.   Hoy Morn, MD

## 2017-08-25 NOTE — Addendum Note (Signed)
Addendum  created 08/25/17 1842 by Jonna Munro, CRNA   Sign clinical note

## 2017-08-25 NOTE — Anesthesia Preprocedure Evaluation (Signed)
Anesthesia Evaluation  Patient identified by MRN, date of birth, ID band Patient awake    Reviewed: Allergy & Precautions, NPO status , Patient's Chart, lab work & pertinent test results  Airway Mallampati: II  TM Distance: >3 FB Neck ROM: Full    Dental  (+) Teeth Intact, Dental Advisory Given   Pulmonary neg pulmonary ROS,    Pulmonary exam normal breath sounds clear to auscultation       Cardiovascular negative cardio ROS Normal cardiovascular exam Rhythm:Regular Rate:Normal     Neuro/Psych Seizures -, Well Controlled,  PSYCHIATRIC DISORDERS Anxiety    GI/Hepatic Neg liver ROS, GERD  Medicated,  Endo/Other  Obesity   Renal/GU negative Renal ROS     Musculoskeletal negative musculoskeletal ROS (+)   Abdominal   Peds  Hematology  (+) Blood dyscrasia, anemia , Plt 299k   Anesthesia Other Findings Day of surgery medications reviewed with the patient.  Reproductive/Obstetrics (+) Pregnancy                             Anesthesia Physical Anesthesia Plan  ASA: III  Anesthesia Plan: Spinal   Post-op Pain Management:    Induction:   PONV Risk Score and Plan: 2 and Treatment may vary due to age or medical condition, Ondansetron, Dexamethasone and Scopolamine patch - Pre-op  Airway Management Planned:   Additional Equipment:   Intra-op Plan:   Post-operative Plan:   Informed Consent: I have reviewed the patients History and Physical, chart, labs and discussed the procedure including the risks, benefits and alternatives for the proposed anesthesia with the patient or authorized representative who has indicated his/her understanding and acceptance.   Dental advisory given  Plan Discussed with: CRNA, Anesthesiologist and Surgeon  Anesthesia Plan Comments: (Discussed risks and benefits of and differences between spinal and general. Discussed risks of spinal including headache,  backache, failure, bleeding, infection, and nerve damage. Patient consents to spinal. Questions answered. Coagulation studies and platelet count acceptable.)        Anesthesia Quick Evaluation

## 2017-08-25 NOTE — Op Note (Signed)
Cesarean Section Procedure Note  Pre-operative Diagnosis: Twins at 53 weeks, Breech presentation for both, desires sterilization  Post-operative Diagnosis: same  Surgeon: Luz Lex   Assistants: none  Anesthesia: spinal  Procedure:  Low Segment Transverse cesarean section and BTL (filshie)  Procedure Details  The patient was seen in the Holding Room. The risks, benefits, complications, treatment options, and expected outcomes were discussed with the patient.  The patient concurred with the proposed plan, giving informed consent.  The site of surgery properly noted/marked.. A Time Out was held and the above information confirmed.  After induction of anesthesia, the patient was draped and prepped in the usual sterile manner. A Pfannenstiel incision was made and carried down through the subcutaneous tissue to the fascia. Fascial incision was made and extended transversely. The fascia was separated from the underlying rectus tissue superiorly and inferiorly. The peritoneum was identified and entered. Peritoneal incision was extended longitudinally. The utero-vesical peritoneal reflection was incised transversely and the bladder flap was bluntly freed from the lower uterine segment. A low transverse uterine incision was made. Delivered from footling breech presentation was a baby Boy with Apgar scores of 8 at one minute and 9 at five minutes. After the umbilical cord was clamped and cut cord blood was obtained for evaluation.  Baby B (girl) footling breech was delivered with Apgars 9,10.  Cord blood obtained.  The placentas was removed intact and appeared normal. The uterine outline, tubes and ovaries appeared normal. The uterine incision was closed with running locked sutures of 0 monocryl and imbricated with 0 monocryl. Hemostasis was observed.The fallopian tubes were identified by their fimbriated ends and filshie clips were placed across the tubes at the midportion of each tube.  Good placement  was noted bilaterally.  Lavage was carried out until clear. The peritoneum was then closed with 0 monocryl and rectus muscles plicated in the midline.  After hemostasis was assured, the fascia was then reapproximated with running sutures of 0 Vicryl. Irrigation was applied and after adequate hemostasis was assured, the skin was reapproximated with subcutaneous sutures using 4-0 monocryl.  Instrument, sponge, and needle counts were correct prior the abdominal closure and at the conclusion of the case. The patient received 2 grams cefotetan preoperatively.  Findings: Viable female A and Femal B  Estimated Blood Loss:  611cc         Specimens: Placentas was sent to labor and delivery         Complications:  None

## 2017-08-25 NOTE — H&P (Signed)
Anita [name] is a 36 y.o. female presenting for primary C/S for Breech twins.  IVF pregnancy with normal NIPT female/female.  Pregnancy otherwise uncomplicated.  She also desires sterilization. OB History    Gravida Para Term Preterm AB Living   1             SAB TAB Ectopic Multiple Live Births                 Past Medical History:  Diagnosis Date  . Anxiety   . Heart murmur   . Newborn product of in vitro fertilization (IVF) pregnancy   . Seizures ([place])    Last seizure in 2013   Past Surgical History:  Procedure Laterality Date  . arm surgery    . HYSTEROSCOPY W/D&C N/A 10/09/2015   Procedure: DILATATION AND CURETTAGE /HYSTEROSCOPY ;  Surgeon: Louretta Shorten, MD;  Location: [facility];  Service: Gynecology;  Laterality: N/A;  . LAPAROSCOPY N/A 10/09/2015   Procedure: LAPAROSCOPY DIAGNOSTIC with CO 2 laser  WITH CHROMOPERTUBATION AND LASER OF ENDOMETRIOSIS WITH LYSIS OF ADHESIONS;  Surgeon: Louretta Shorten, MD;  Location: [facility];  Service: Gynecology;  Laterality: N/A;   Family History: family history includes Bone cancer in her maternal grandfather; Healthy in her father and mother; Heart disease in her maternal grandmother. Social History:  reports that  has never smoked. she has never used smokeless tobacco. She reports that she does not drink alcohol or use drugs.     Maternal Diabetes: No Genetic Screening: Normal Maternal Ultrasounds/Referrals: Normal Fetal Ultrasounds or other Referrals:  None Maternal Substance Abuse:  No Significant Maternal Medications:  Meds include: Other: lamictal for seizures Significant Maternal Lab Results:  None Other Comments:  None  ROS History   Blood pressure 128/88, pulse 82, temperature 97.9 F (36.6 C), temperature source Oral, resp. rate 16, height 5\' 7"  (1.702 m), weight 199 lb (90.3 kg). Exam Physical Exam   Cx 1-2/50/high   Prenatal labs: ABO, Rh: --/--/O POS (12/27 1010) Antibody: NEG (12/27 1010) Rubella: Immune (06/13 0000) RPR: Non  Reactive (12/27 1010)  HBsAg: Negative (06/13 0000)  HIV: Non-reactive (06/13 0000)  GBS:     Assessment/Plan: Twins at 38 weeks Br/Br presentation for primary C/S Also desires sterilization Risks and benefits of C/S were discussed.  All questions were answered and informed consent was obtained.  Plan to proceed with low segment transverse Cesarean Section. Discussed risks and benefits of sterilization including but not limited to risk of tubal failure quoted as 12/998.  She gives her informed consent.This patient has been seen and examined.   All of her questions were answered.  Labs and vital signs reviewed.  Informed consent has been obtained.  The History and Physical is current.   [name] 08/25/2017, 10:00 AM

## 2017-08-25 NOTE — Progress Notes (Signed)
Low output earlier now increasing since plain LR hung and Pt pushing fluids. Urine clearing in color vs amber earlier

## 2017-08-25 NOTE — Anesthesia Postprocedure Evaluation (Signed)
Anesthesia Post Note  Patient: Anita [name]  Procedure(s) Performed: CESAREAN SECTION MULTI-GESTATIONAL WITH TUBAL (N/A )     Patient location during evaluation: PACU Anesthesia Type: Spinal Level of consciousness: oriented and awake and alert Pain management: pain level controlled Vital Signs Assessment: post-procedure vital signs reviewed and stable Respiratory status: spontaneous breathing, respiratory function stable and patient connected to nasal cannula oxygen Cardiovascular status: blood pressure returned to baseline and stable Postop Assessment: no headache, no backache, no apparent nausea or vomiting, patient able to bend at knees and spinal receding Anesthetic complications: no    Last Vitals:  Vitals:   08/25/17 1250 08/25/17 1255  BP: (!) 62/43   Pulse: 76   Resp: 18 12  Temp:    SpO2: 99%     Last Pain:  Vitals:   08/25/17 1300  TempSrc:   PainSc: 4    Pain Goal:                 [name]

## 2017-08-25 NOTE — Anesthesia Postprocedure Evaluation (Signed)
Anesthesia Post Note  Patient: Anita [name]  Procedure(s) Performed: CESAREAN SECTION MULTI-GESTATIONAL WITH TUBAL (N/A )     Patient location during evaluation: Mother Baby Anesthesia Type: Spinal Level of consciousness: awake and alert and oriented Pain management: satisfactory to patient Vital Signs Assessment: post-procedure vital signs reviewed and stable Respiratory status: spontaneous breathing and nonlabored ventilation Cardiovascular status: stable Postop Assessment: no headache, no backache, patient able to bend at knees, no signs of nausea or vomiting and adequate PO intake Anesthetic complications: no    Last Vitals:  Vitals:   08/25/17 1530 08/25/17 1630  BP: 98/70   Pulse: (!) 57   Resp: 17   Temp: 36.5 C   SpO2: 96% 96%    Last Pain:  Vitals:   08/25/17 1530  TempSrc: Axillary  PainSc:    Pain Goal:                 [name]

## 2017-08-26 LAB — CBC
HEMATOCRIT: 21 % — AB (ref 36.0–46.0)
HEMOGLOBIN: 7.3 g/dL — AB (ref 12.0–15.0)
MCH: 30.6 pg (ref 26.0–34.0)
MCHC: 35.2 g/dL (ref 30.0–36.0)
MCV: 86.8 fL (ref 78.0–100.0)
Platelets: 248 10*3/uL (ref 150–400)
RBC: 2.42 MIL/uL — ABNORMAL LOW (ref 3.87–5.11)
RDW: 14.1 % (ref 11.5–15.5)
WBC: 13.9 10*3/uL — AB (ref 4.0–10.5)

## 2017-08-26 MED ORDER — FERROUS GLUCONATE 324 (38 FE) MG PO TABS
324.0000 mg | ORAL_TABLET | Freq: Two times a day (BID) | ORAL | Status: DC
  Administered 2017-08-26 – 2017-08-28 (×4): 324 mg via ORAL
  Filled 2017-08-26 (×4): qty 1

## 2017-08-26 NOTE — Progress Notes (Signed)
Subjective: Postpartum Day 1: Cesarean Delivery Patient reports incisional pain and tolerating PO.  No dizziness on standing.  Requests circ tomorrow.  Objective: Vital signs in last 24 hours: Temp:  [96.4 F (35.8 C)-100.6 F (38.1 C)] 98.7 F (37.1 C) (12/29 0800) Pulse Rate:  [50-88] 84 (12/29 0800) Resp:  [12-20] 20 (12/29 0800) BP: (62-124)/(43-71) 118/60 (12/29 0800) SpO2:  [95 %-100 %] 99 % (12/29 0800)  Physical Exam:  General: alert, cooperative and appears stated age Lochia: appropriate Uterine Fundus: firm Incision: healing well, no significant drainage, no dehiscence DVT Evaluation: No evidence of DVT seen on physical exam. Negative Homan's sign. No cords or calf tenderness.  Recent Labs    08/24/17 1010 08/26/17 0537  HGB 11.6* 7.3*  HCT 34.3* 21.0*    Assessment/Plan: Status post Cesarean section. Doing well postoperatively.  Acute blood loss anemia-iron supp Continue current care.  Jaleigh Mccroskey, [place] 08/26/2017, 10:24 AM

## 2017-08-27 ENCOUNTER — Other Ambulatory Visit: Payer: Self-pay

## 2017-08-27 ENCOUNTER — Encounter (HOSPITAL_COMMUNITY): Payer: Self-pay | Admitting: Obstetrics and Gynecology

## 2017-08-27 LAB — CBC
HCT: 21.7 % — ABNORMAL LOW (ref 36.0–46.0)
Hemoglobin: 7.3 g/dL — ABNORMAL LOW (ref 12.0–15.0)
MCH: 30 pg (ref 26.0–34.0)
MCHC: 33.6 g/dL (ref 30.0–36.0)
MCV: 89.3 fL (ref 78.0–100.0)
PLATELETS: 261 10*3/uL (ref 150–400)
RBC: 2.43 MIL/uL — AB (ref 3.87–5.11)
RDW: 14.7 % (ref 11.5–15.5)
WBC: 11 10*3/uL — ABNORMAL HIGH (ref 4.0–10.5)

## 2017-08-27 LAB — URINALYSIS, ROUTINE W REFLEX MICROSCOPIC
Bacteria, UA: NONE SEEN
Bilirubin Urine: NEGATIVE
Glucose, UA: NEGATIVE mg/dL
KETONES UR: NEGATIVE mg/dL
Leukocytes, UA: NEGATIVE
Nitrite: NEGATIVE
PH: 6 (ref 5.0–8.0)
PROTEIN: NEGATIVE mg/dL
Specific Gravity, Urine: 1.005 (ref 1.005–1.030)

## 2017-08-27 MED ORDER — OXYBUTYNIN CHLORIDE 5 MG PO TABS
2.5000 mg | ORAL_TABLET | Freq: Three times a day (TID) | ORAL | Status: DC
  Administered 2017-08-27 (×2): 2.5 mg via ORAL
  Filled 2017-08-27 (×2): qty 0.5

## 2017-08-27 NOTE — Progress Notes (Signed)
Subjective: Postpartum Day 2: Cesarean Delivery Patient reports tolerating PO and + flatus.  Complains of bladder spasm not improved with ditropan; patient does not want to continue meds.  Unable to take pyridium secondary to red dye allergy.  Patient requests circ today.  Objective: Vital signs in last 24 hours: Temp:  [97.6 F (36.4 C)-98.6 F (37 C)] 98.4 F (36.9 C) (12/30 0646) Pulse Rate:  [64-82] 64 (12/30 0646) Resp:  [18-20] 18 (12/30 0646) BP: (117-129)/(62-83) 121/75 (12/30 0646) SpO2:  [100 %] 100 % (12/29 1203)  Physical Exam:  General: alert, cooperative and appears stated age Lochia: appropriate Uterine Fundus: firm Incision: healing well, no significant drainage, no dehiscence DVT Evaluation: No evidence of DVT seen on physical exam. Negative Homan's sign. No cords or calf tenderness.  Recent Labs    08/26/17 0537 08/27/17 0507  HGB 7.3* 7.3*  HCT 21.0* 21.7*    Assessment/Plan: Status post Cesarean section. Doing well postoperatively.  Continue current care. Bladder spasm-d/c ditropan per pt request Circ-counseled re: risk of bleeding, infection, and scarring.  All questions were answered and the patient wishes to proceed.  Anita [name], [place] 08/27/2017, 9:49 AM

## 2017-08-28 MED ORDER — IBUPROFEN 600 MG PO TABS: 600.0000 mg | ORAL_TABLET | Freq: Four times a day (QID) | ORAL | 0 refills | Status: DC | PRN

## 2017-08-28 MED ORDER — OXYCODONE HCL 5 MG PO TABS: 5.0000 mg | ORAL_TABLET | ORAL | 0 refills | Status: DC | PRN

## 2017-08-28 MED ORDER — DOCUSATE SODIUM 100 MG PO CAPS: 100.0000 mg | ORAL_CAPSULE | Freq: Two times a day (BID) | ORAL | 2 refills | Status: DC

## 2017-08-28 MED FILL — IBUPROFEN 600 MG TABLET: 600 | 8 days supply | Qty: 30 | Fill #0

## 2017-08-28 NOTE — Discharge Summary (Signed)
Obstetric Discharge Summary Reason for Admission: cesarean section Prenatal Procedures: none Intrapartum Procedures: cesarean: low cervical, transverse Postpartum Procedures: none Complications-Operative and Postpartum: none Hemoglobin  Date Value Ref Range Status  08/27/2017 7.3 (L) 12.0 - 15.0 g/dL Final   HCT  Date Value Ref Range Status  08/27/2017 21.7 (L) 36.0 - 46.0 % Final    Physical Exam:  General: alert, cooperative and appears stated age 36: appropriate Uterine Fundus: firm Incision: healing well, no significant drainage, no dehiscence, no significant erythema DVT Evaluation: No evidence of DVT seen on physical exam. Negative Homan's sign. No cords or calf tenderness. No significant calf/ankle edema.  Discharge Diagnoses: Term Pregnancy-delivered  Discharge Information: Date: 08/28/2017 Activity: pelvic rest Diet: routine Medications: Ibuprofen, Colace and oxycodone Condition: stable Instructions: refer to practice specific booklet Discharge to: home   Newborn Data:   Samanvi, Cuccia [[id number]]  Live born female  Birth Weight: 5 lb 10.8 oz (2575 g) APGAR: 23, 9  Newborn Delivery   Birth date/time:  08/25/2017 11:00:00 Delivery type:  C-Section, Low Transverse C-section categorization:  Primary      Taniyah, Ballow [[id number]]  Live born female  Birth Weight: 5 lb 3.1 oz (2355 g) APGAR: 9, 10  Newborn Delivery   Birth date/time:  08/25/2017 11:03:00 Delivery type:  C-Section, Low Transverse C-section categorization:  Primary     Home with mother.  Tyson Dense 08/28/2017, 8:33 AM

## 2017-08-31 MED FILL — VIMPAT 200 MG TABLET: 200 | 30 days supply | Qty: 30 | Fill #2

## 2017-09-11 MED FILL — LaMICtal 200 MG TABS: 200 | 30 days supply | Qty: 90 | Fill #6

## 2017-09-14 ENCOUNTER — Encounter (HOSPITAL_COMMUNITY): Payer: Self-pay

## 2017-09-14 ENCOUNTER — Other Ambulatory Visit: Payer: Self-pay

## 2017-09-14 ENCOUNTER — Inpatient Hospital Stay (HOSPITAL_COMMUNITY)
Admission: AD | Admit: 2017-09-14 | Discharge: 2017-09-14 | Disposition: A | Discharge status: Home / Self Care | Payer: 59 | Source: Ambulatory Visit | Attending: Obstetrics and Gynecology | Admitting: Obstetrics and Gynecology

## 2017-09-14 DIAGNOSIS — R109 Unspecified abdominal pain: Secondary | ICD-10-CM | POA: Diagnosis not present

## 2017-09-14 DIAGNOSIS — D649 Anemia, unspecified: Secondary | ICD-10-CM | POA: Insufficient documentation

## 2017-09-14 DIAGNOSIS — N939 Abnormal uterine and vaginal bleeding, unspecified: Secondary | ICD-10-CM

## 2017-09-14 DIAGNOSIS — F419 Anxiety disorder, unspecified: Secondary | ICD-10-CM | POA: Insufficient documentation

## 2017-09-14 HISTORY — DX: Anemia, unspecified: D64.9

## 2017-09-14 LAB — CBC
HCT: 29.3 % — ABNORMAL LOW (ref 36.0–46.0)
Hemoglobin: 9.5 g/dL — ABNORMAL LOW (ref 12.0–15.0)
MCH: 28.8 pg (ref 26.0–34.0)
MCHC: 32.4 g/dL (ref 30.0–36.0)
MCV: 88.8 fL (ref 78.0–100.0)
PLATELETS: 590 10*3/uL — AB (ref 150–400)
RBC: 3.3 MIL/uL — AB (ref 3.87–5.11)
RDW: 14.5 % (ref 11.5–15.5)
WBC: 10.8 10*3/uL — AB (ref 4.0–10.5)

## 2017-09-14 MED ORDER — KETOROLAC TROMETHAMINE 60 MG/2ML IM SOLN
60.0000 mg | Freq: Once | INTRAMUSCULAR | Status: AC
  Administered 2017-09-14: 60 mg via INTRAMUSCULAR
  Filled 2017-09-14: qty 2

## 2017-09-14 MED ORDER — METHYLERGONOVINE MALEATE 0.2 MG PO TABS: 0.2000 mg | ORAL_TABLET | Freq: Three times a day (TID) | ORAL | 0 refills | Status: AC

## 2017-09-14 MED ORDER — METHYLERGONOVINE MALEATE 0.2 MG/ML IJ SOLN
0.2000 mg | Freq: Once | INTRAMUSCULAR | Status: AC
  Administered 2017-09-14: 0.2 mg via INTRAMUSCULAR
  Filled 2017-09-14: qty 1

## 2017-09-14 MED ORDER — OXYCODONE-ACETAMINOPHEN 5-325 MG PO TABS
1.0000 | ORAL_TABLET | Freq: Once | ORAL | Status: AC
  Administered 2017-09-14: 1 via ORAL
  Filled 2017-09-14: qty 1

## 2017-09-14 MED ORDER — METHYLERGONOVINE MALEATE 0.2 MG PO TABS: 0.2000 mg | ORAL_TABLET | Freq: Three times a day (TID) | ORAL | 0 refills | Status: DC

## 2017-09-14 MED FILL — METHYLERGONOVINE MALEATE 0.: 0.2 | 3 days supply | Qty: 9 | Fill #0

## 2017-09-14 NOTE — MAU Provider Note (Signed)
History     CSN: [id number]  Arrival date and time: 09/14/17 1240   First Provider Initiated Contact with Patient 09/14/17 1405      Chief Complaint  Patient presents with  . Vaginal Bleeding   HPI Anita [name] 37 y.o. Comes to MAU from the office.  Has had heavy vaginal bleeding with clots at home and then more clots removed on vaginal exam in the office.  Continues to have vaginal bleeding so sent to MAU for CBC, methergine and pain management.  Having pain now but it is less than when she was in the office.  Has not taken pain medication today.  Having vaginal bleeding now that is dark red.  Pad currently has a 3-4 cm area of blood.  Client is postpartum and is currently breastfeeding.  She had a C/S for twins on 08-25-17.  Bleeding at home has been off and on but today was much worse with large hand sized clots at home.  OB History    Gravida Para Term Preterm AB Living   1 1 1     2    SAB TAB Ectopic Multiple Live Births         1 2      Past Medical History:  Diagnosis Date  . Anemia   . Anxiety   . Heart murmur   . Newborn product of in vitro fertilization (IVF) pregnancy   . Seizures ([place])    Last seizure in 2013    Past Surgical History:  Procedure Laterality Date  . arm surgery    . CESAREAN SECTION MULTI-GESTATIONAL WITH TUBAL N/A 08/25/2017   Procedure: CESAREAN SECTION MULTI-GESTATIONAL WITH TUBAL;  Surgeon: Louretta Shorten, MD;  Location: [facility];  Service: Obstetrics;  Laterality: N/A;  Primary edc 09/08/17 need RNFA  . HYSTEROSCOPY W/D&C N/A 10/09/2015   Procedure: DILATATION AND CURETTAGE /HYSTEROSCOPY ;  Surgeon: Louretta Shorten, MD;  Location: [facility];  Service: Gynecology;  Laterality: N/A;  . LAPAROSCOPY N/A 10/09/2015   Procedure: LAPAROSCOPY DIAGNOSTIC with CO 2 laser  WITH CHROMOPERTUBATION AND LASER OF ENDOMETRIOSIS WITH LYSIS OF ADHESIONS;  Surgeon: Louretta Shorten, MD;  Location: [facility];  Service: Gynecology;  Laterality: N/A;    Family History   Problem Relation Age of Onset  . Healthy Mother   . Healthy Father   . Heart disease Maternal Grandmother   . Bone cancer Maternal Grandfather     Social History   Tobacco Use  . Smoking status: Never Smoker  . Smokeless tobacco: Never Used  Substance Use Topics  . Alcohol use: No  . Drug use: No    Allergies:  Allergies  Allergen Reactions  . Dye Fdc Red [Dye Fdc Red 3 (Erythrosine)] Nausea And Vomiting  . Flagyl [Metronidazole Hcl]     GI upset  . Morphine And Related Nausea And Vomiting    Medications Prior to Admission  Medication Sig Dispense Refill Last Dose  . ibuprofen (ADVIL,MOTRIN) 600 MG tablet Take 1 tablet (600 mg total) by mouth every 6 (six) hours as needed. 30 tablet 0   . lacosamide (VIMPAT) 200 MG TABS tablet Take 1 tablet (200 mg total) by mouth at bedtime. 90 tablet 1 08/24/2017 at 1900  . LAMICTAL 200 MG tablet TAKE 1 & 1/2 TABLETS BY MOUTH TWICE DAILY 90 tablet 11 08/25/2017 at 0330  . oxyCODONE (OXY IR/ROXICODONE) 5 MG immediate release tablet Take 1 tablet (5 mg total) by mouth every 4 (four) hours as needed for  severe pain. 15 tablet 0   . Prenatal Vit-Fe Fumarate-FA (PRENATAL MULTIVITAMIN) TABS tablet Take 1 tablet by mouth at bedtime.    08/14/2017 at Unknown time    Review of Systems  Constitutional: Negative for fever.  Gastrointestinal: Positive for abdominal pain. Negative for diarrhea, nausea and vomiting.  Genitourinary: Positive for vaginal bleeding. Negative for vaginal discharge.  Neurological: Negative for dizziness and syncope.   Physical Exam   Blood pressure 119/70, pulse 86, temperature 99.1 F (37.3 C), temperature source Oral, resp. rate 18, weight 173 lb 12 oz (78.8 kg), SpO2 100 %, not currently breastfeeding.  Physical Exam  Nursing note and vitals reviewed. Constitutional: She is oriented to person, place, and time. She appears well-developed and well-nourished.  HENT:  Head: Normocephalic.  Eyes: EOM are normal.   Neck: Neck supple.  Respiratory: Effort normal.  GI: Soft. There is tenderness. There is no rebound and no guarding.  C/S scar well healed.  Genitourinary:  Genitourinary Comments: Moderate amount of dark blood on pad.  No clots expelled in MAU.  Client declines a speculum exam.  Musculoskeletal: Normal range of motion.  Neurological: She is alert and oriented to person, place, and time.  Skin: Skin is warm and dry.  Psychiatric: She has a normal mood and affect.    MAU Course  Procedures Results for orders placed or performed during the hospital encounter of 09/14/17 (from the past 24 hour(s))  CBC     Status: Abnormal   Collection Time: 09/14/17  1:05 PM  Result Value Ref Range   WBC 10.8 (H) 4.0 - 10.5 K/uL   RBC 3.30 (L) 3.87 - 5.11 MIL/uL   Hemoglobin 9.5 (L) 12.0 - 15.0 g/dL   HCT 29.3 (L) 36.0 - 46.0 %   MCV 88.8 78.0 - 100.0 fL   MCH 28.8 26.0 - 34.0 pg   MCHC 32.4 30.0 - 36.0 g/dL   RDW 14.5 11.5 - 15.5 %   Platelets 590 (H) 150 - 400 K/uL    MDM Consult with Dr. Helane Rima.  HGB has increased since delivery.  Today is 9.5 and 2 weeks ago was 7.3.  Methergine IM given in MAU.  Had Toradol IM and Percocet PO for pain.  Before discharge was able to get up and walk to the bathroom without syncope and without clots being expelled.  Assessment and Plan  Abnormal postpartum vaginal bleeding. Anemia  Plan Get your methergine from the pharmacy and take 3 times a day for 3 days to help control your vaginal bleeding. Return if your bleeding becomes heavy again with large clots. See Dr. Gertie Fey in the office on Friday. Drink at least 8 8-oz glasses of water every day. Use Ibuprofen for pain control. Eat healthy foods and continue your prenatal vitamins and iron supplements.  Rakan Soffer L Zionah Criswell 09/14/2017, 2:13 PM

## 2017-09-14 NOTE — Discharge Instructions (Signed)
Get your methergine and take 3 times a day to help control your vaginal bleeding. Return if your bleeding becomes heavy again with large clots. See Dr. Gertie Fey in the office on Friday.

## 2017-09-14 NOTE — MAU Note (Signed)
Scheduled c/s for twins on 12/28. Has been bleeding since delivery.  Severe  Pain started yesterday. Today she past a hand sized clot.  Went to the office, Dr Helane Rima examined her and removed several more.  Sent over for further eval.  Changing hourly.

## 2017-09-20 NOTE — Progress Notes (Deleted)
[facility]  PATIENT: Anita [name] DOB: 1980-10-04   REASON FOR VISIT: Follow-up for generalized epilepsy pregnant with twins 28 weeks HISTORY FROM: Patient    HISTORY OF PRESENT ILLNESS:Ms. Anita [name], 37 year old female returns for followup.   She has a history of generalized seizure disorder with last seizure occurring Jan 23, 2015 after stopping her Klonopin abruptly for 1 week. She is [redacted] weeks  pregnant with twins. She is aware that she is a high risk pregnancy due to her seizure disorder  She is currently on Vimpat 200 mg at bedtime and Lamictal 300 mg in the morning and 300 at night. She continues to work full time. No other new neurologic complaints. She returns for reevaluation. She was made  aware that she will need to be seen every 3 months for labs and possible medication adjustments of her seizure medication. Her labs were stable at her last visit    HISTORY: generalized seizure disorder. Date of last seizure 08/24/2012 when she hit her head on the table after having a seizure. CT of the head was normal. She denies further staring spells, confusion, sleep disturbances, lapses of time, headache and bowel and bladder incontinence. She continues to work full-time at [employer] . She did not get her Lamictal level after last visit. She is not driving.   REVIEW OF SYSTEMS: Full 14 system review of systems performed and notable only for those listed, all others are neg:  Constitutional: neg  Cardiovascular: neg Ear/Nose/Throat: neg  Skin: neg Eyes: neg Respiratory: neg Gastroitestinal: neg  Hematology/Lymphatic: neg  Endocrine: neg Musculoskeletal:neg Allergy/Immunology: neg Neurological: neg Psychiatric: neg Sleep : neg   ALLERGIES: Allergies  Allergen Reactions  . Dye Fdc Red [Dye Fdc Red 3 (Erythrosine)] Nausea And Vomiting  . Flagyl [Metronidazole Hcl]     GI upset  . Morphine And Related Nausea And Vomiting    HOME  MEDICATIONS: Outpatient Medications Prior to Visit  Medication Sig Dispense Refill  . ibuprofen (ADVIL,MOTRIN) 600 MG tablet Take 1 tablet (600 mg total) by mouth every 6 (six) hours as needed. 30 tablet 0  . lacosamide (VIMPAT) 200 MG TABS tablet Take 1 tablet (200 mg total) by mouth at bedtime. 90 tablet 1  . LAMICTAL 200 MG tablet TAKE 1 & 1/2 TABLETS BY MOUTH TWICE DAILY 90 tablet 11  . oxyCODONE (OXY IR/ROXICODONE) 5 MG immediate release tablet Take 1 tablet (5 mg total) by mouth every 4 (four) hours as needed for severe pain. 15 tablet 0  . Prenatal Vit-Fe Fumarate-FA (PRENATAL MULTIVITAMIN) TABS tablet Take 1 tablet by mouth at bedtime.      No facility-administered medications prior to visit.     PAST MEDICAL HISTORY: Past Medical History:  Diagnosis Date  . Anemia   . Anxiety   . Heart murmur   . Newborn product of in vitro fertilization (IVF) pregnancy   . Seizures ([place])    Last seizure in 2013    PAST SURGICAL HISTORY: Past Surgical History:  Procedure Laterality Date  . arm surgery    . CESAREAN SECTION MULTI-GESTATIONAL WITH TUBAL N/A 08/25/2017   Procedure: CESAREAN SECTION MULTI-GESTATIONAL WITH TUBAL;  Surgeon: Louretta Shorten, MD;  Location: [facility];  Service: Obstetrics;  Laterality: N/A;  Primary edc 09/08/17 need RNFA  . HYSTEROSCOPY W/D&C N/A 10/09/2015   Procedure: DILATATION AND CURETTAGE /HYSTEROSCOPY ;  Surgeon: Louretta Shorten, MD;  Location: [facility];  Service: Gynecology;  Laterality: N/A;  . LAPAROSCOPY N/A 10/09/2015  Procedure: LAPAROSCOPY DIAGNOSTIC with CO 2 laser  WITH CHROMOPERTUBATION AND LASER OF ENDOMETRIOSIS WITH LYSIS OF ADHESIONS;  Surgeon: Louretta Shorten, MD;  Location: [facility];  Service: Gynecology;  Laterality: N/A;    FAMILY HISTORY: Family History  Problem Relation Age of Onset  . Healthy Mother   . Healthy Father   . Heart disease Maternal Grandmother   . Bone cancer Maternal Grandfather     SOCIAL HISTORY: Social History    Socioeconomic History  . Marital status: Single    Spouse name: Not on file  . Number of children: 0  . Years of education: college  . Highest education level: Not on file  Social Needs  . Financial resource strain: Not on file  . Food insecurity - worry: Not on file  . Food insecurity - inability: Not on file  . Transportation needs - medical: Not on file  . Transportation needs - non-medical: Not on file  Occupational History    Employer: [employer]  Tobacco Use  . Smoking status: Never Smoker  . Smokeless tobacco: Never Used  Substance and Sexual Activity  . Alcohol use: No  . Drug use: No  . Sexual activity: Yes    Birth control/protection: Surgical  Other Topics Concern  . Not on file  Social History Narrative   Patient lives at home with her boyfriend. Patient works full time at [employer].   Education- College   Right handed.   Caffeine- coffee four times a week.   Trying to get pregnant.  05-13-15     PHYSICAL EXAM  There were no vitals filed for this visit. There is no height or weight on file to calculate BMI. Generalized: Well developed, in no acute distress  2 [redacted] weeks  pregnant Head: normocephalic and atraumatic,. Oropharynx benign  Musculoskeletal: No deformity   Neurological examination   Mentation: Alert oriented to time, place, history taking. Follows all commands speech and language fluent  Cranial nerve II-XII: Pupils were equal round reactive to light extraocular movements were full, visual field were full on confrontational test. Facial sensation and strength were normal. hearing was intact to finger rubbing bilaterally. Uvula tongue midline. head turning and shoulder shrug were normal and symmetric.Tongue protrusion into cheek strength was normal. Motor: normal bulk and tone, full strength in the BUE, BLE, fine finger movements normal, no pronator drift. No focal weakness Coordination: finger-nose-finger, heel-to-shin bilaterally, no  dysmetria, no tremor Reflexes: 1+ upper lower and symmetric, plantar responses were flexor bilaterally. Gait and Station: Rising up from seated position without assistance, normal stance, moderate stride, good arm swing, smooth turning, able to perform tiptoe, and heel walking without difficulty. Tandem gait is steady  DIAGNOSTIC DATA (LABS, IMAGING, TESTING) - I reviewed patient records, labs, notes, testing and imaging myself where available.  Lab Results  Component Value Date   WBC 10.8 (H) 09/14/2017   HGB 9.5 (L) 09/14/2017   HCT 29.3 (L) 09/14/2017   MCV 88.8 09/14/2017   PLT 590 (H) 09/14/2017    ASSESSMENT AND PLAN 37 y.o. year old female has a past medical history of Seizures; here to follow-up. Last generalized seizure occurred in 01/23/2015 after stopping her Klonopin abruptly. She is [redacted] weeks pregnant with twins and is  high risk because of seizure disorder.   PLAN: Continue Vimpat at current dose will refill Continue Lamictal 300mg  twice daily  Will check level of Vimpat and Lamictal today for therapeutic levels pt is [redacted] weeks  pregnant and will probably  need medication adjustments Call for seizure activity F/U in 3  months [name], [facility], [facility], [facility] [address] [place], [place] [address] [phone]

## 2017-09-21 ENCOUNTER — Ambulatory Visit: Payer: 59 | Admitting: Nurse Practitioner

## 2017-09-21 ENCOUNTER — Telehealth: Payer: Self-pay | Admitting: *Deleted

## 2017-09-21 NOTE — Telephone Encounter (Signed)
Patient was no show for FU with NP today. 

## 2017-09-22 ENCOUNTER — Encounter: Payer: Self-pay | Admitting: Nurse Practitioner

## 2017-09-27 DIAGNOSIS — Z1389 Encounter for screening for other disorder: Secondary | ICD-10-CM | POA: Diagnosis not present

## 2017-10-03 MED FILL — VIMPAT 200 MG TABLET: 200 | 30 days supply | Qty: 30 | Fill #3

## 2017-10-03 MED FILL — ESCITALOPRAM 10 MG TABLET: 10 | 30 days supply | Qty: 30 | Fill #0

## 2017-10-12 MED FILL — LaMICtal 200 MG TABS: 200 | 30 days supply | Qty: 90 | Fill #7

## 2017-11-07 MED FILL — VIMPAT 200 MG TABLET: 200 | 30 days supply | Qty: 30 | Fill #4

## 2017-11-07 MED FILL — LaMICtal 200 MG TABS: 200 | 30 days supply | Qty: 90 | Fill #8

## 2017-12-04 MED FILL — LaMICtal 200 MG TABS: 200 | 30 days supply | Qty: 90 | Fill #9

## 2017-12-04 MED FILL — VIMPAT 200 MG TABLET: 200 | 30 days supply | Qty: 30 | Fill #5

## 2018-01-02 ENCOUNTER — Telehealth: Payer: Self-pay | Admitting: *Deleted

## 2018-01-02 ENCOUNTER — Other Ambulatory Visit: Payer: Self-pay | Admitting: Nurse Practitioner

## 2018-01-02 ENCOUNTER — Encounter: Payer: Self-pay | Admitting: Nurse Practitioner

## 2018-01-02 MED ORDER — LACOSAMIDE 200 MG PO TABS: 200.0000 mg | ORAL_TABLET | Freq: Every day | ORAL | 1 refills | Status: DC

## 2018-01-02 MED FILL — LaMICtal 200 MG TABS: 200 | 30 days supply | Qty: 90 | Fill #10

## 2018-01-02 MED FILL — VIMPAT 200 MG TABLET: 200 | 90 days supply | Qty: 90 | Fill #0

## 2018-01-02 NOTE — Telephone Encounter (Signed)
Vimpat refill Rx successfully faxed to [facility] pharmacy.

## 2018-01-02 NOTE — Telephone Encounter (Signed)
LVM advising patient to call and schedule follow up; she was no show in Jan. Advised her to call asap and schedule, so that Vimpat can be refilled; left number.

## 2018-02-05 MED FILL — LaMICtal 200 MG TABS: 200 | 30 days supply | Qty: 90 | Fill #11

## 2018-03-07 ENCOUNTER — Other Ambulatory Visit: Payer: Self-pay | Admitting: Neurology

## 2018-03-07 MED FILL — LaMICtal 200 MG TABS: 200 | 30 days supply | Qty: 90 | Fill #0

## 2018-03-16 ENCOUNTER — Encounter (HOSPITAL_COMMUNITY): Payer: Self-pay | Admitting: Emergency Medicine

## 2018-03-16 ENCOUNTER — Emergency Department (HOSPITAL_COMMUNITY)
Admission: EM | Admit: 2018-03-16 | Discharge: 2018-03-16 | Disposition: A | Discharge status: Home / Self Care | Payer: 59 | Attending: Emergency Medicine | Admitting: Emergency Medicine

## 2018-03-16 ENCOUNTER — Emergency Department (HOSPITAL_COMMUNITY): Payer: 59

## 2018-03-16 ENCOUNTER — Encounter (HOSPITAL_COMMUNITY): Payer: Self-pay

## 2018-03-16 ENCOUNTER — Ambulatory Visit (HOSPITAL_COMMUNITY)
Admission: EM | Admit: 2018-03-16 | Discharge: 2018-03-16 | Disposition: A | Discharge status: Home / Self Care | Payer: 59 | Attending: Family Medicine | Admitting: Family Medicine

## 2018-03-16 DIAGNOSIS — Z79899 Other long term (current) drug therapy: Secondary | ICD-10-CM | POA: Diagnosis not present

## 2018-03-16 DIAGNOSIS — M940 Chondrocostal junction syndrome [Tietze]: Secondary | ICD-10-CM | POA: Insufficient documentation

## 2018-03-16 DIAGNOSIS — R071 Chest pain on breathing: Secondary | ICD-10-CM | POA: Diagnosis not present

## 2018-03-16 DIAGNOSIS — R51 Headache: Secondary | ICD-10-CM | POA: Insufficient documentation

## 2018-03-16 DIAGNOSIS — R079 Chest pain, unspecified: Secondary | ICD-10-CM | POA: Diagnosis not present

## 2018-03-16 DIAGNOSIS — F419 Anxiety disorder, unspecified: Secondary | ICD-10-CM | POA: Insufficient documentation

## 2018-03-16 LAB — BASIC METABOLIC PANEL
ANION GAP: 8 (ref 5–15)
BUN: 8 mg/dL (ref 6–20)
CHLORIDE: 105 mmol/L (ref 98–111)
CO2: 25 mmol/L (ref 22–32)
CREATININE: 0.72 mg/dL (ref 0.44–1.00)
Calcium: 9.1 mg/dL (ref 8.9–10.3)
GFR calc non Af Amer: >60 mL/min (ref >60)
Glucose, Bld: 90 mg/dL (ref 70–99)
POTASSIUM: 3.9 mmol/L (ref 3.5–5.1)
Sodium: 138 mmol/L (ref 135–145)

## 2018-03-16 LAB — I-STAT BETA HCG BLOOD, ED (MC, WL, AP ONLY)

## 2018-03-16 LAB — CBC
HCT: 38.6 % (ref 36.0–46.0)
HEMOGLOBIN: 12 g/dL (ref 12.0–15.0)
MCH: 26.7 pg (ref 26.0–34.0)
MCHC: 31.1 g/dL (ref 30.0–36.0)
MCV: 85.8 fL (ref 78.0–100.0)
PLATELETS: 420 10*3/uL — AB (ref 150–400)
RBC: 4.5 MIL/uL (ref 3.87–5.11)
RDW: 15.5 % (ref 11.5–15.5)
WBC: 9.4 10*3/uL (ref 4.0–10.5)

## 2018-03-16 LAB — I-STAT TROPONIN, ED: TROPONIN I, POC: 0 ng/mL (ref 0.00–0.08)

## 2018-03-16 MED ORDER — IOPAMIDOL (ISOVUE-370) INJECTION 76%
100.0000 mL | Freq: Once | INTRAVENOUS | Status: AC | PRN
  Administered 2018-03-16: 100 mL via INTRAVENOUS

## 2018-03-16 MED ORDER — METHOCARBAMOL 500 MG PO TABS: 500.0000 mg | ORAL_TABLET | Freq: Two times a day (BID) | ORAL | 0 refills | Status: DC

## 2018-03-16 MED ORDER — IOPAMIDOL (ISOVUE-370) INJECTION 76%
INTRAVENOUS | Status: AC
  Filled 2018-03-16: qty 100

## 2018-03-16 MED ORDER — IBUPROFEN 600 MG PO TABS: 600.0000 mg | ORAL_TABLET | Freq: Four times a day (QID) | ORAL | 0 refills | Status: DC | PRN

## 2018-03-16 MED ORDER — ACETAMINOPHEN 500 MG PO TABS
1000.0000 mg | ORAL_TABLET | Freq: Once | ORAL | Status: AC
  Administered 2018-03-16: 1000 mg via ORAL
  Filled 2018-03-16: qty 2

## 2018-03-16 NOTE — Discharge Instructions (Addendum)
Thank you for allowing me to care for you today in the Emergency Department.   You CT today did not show a blood clot in your lungs.   Take 600 mg of ibuprofen with food every 6 hours for pain for the next 4-5 days. I would schedule this medication out so you don't miss a dose to provide a constant level of medication and pain control. Take Robaxin, one tablet, up to 2 times days. This is a muscle relaxer, but shouldn't make you drowsy.   Call and schedule follow-up appointment with your primary care provider if your symptoms do not start to improve within the next week.  Return to the emergency department if you develop chest pain with activity, severe shortness of breath, pain radiating down your arm up your neck, if you pass out, new numbness or weakness, or other new, concerning symptoms.

## 2018-03-16 NOTE — ED Provider Notes (Signed)
[facility] EMERGENCY DEPARTMENT Provider Note   CSN: [id number] Arrival date & time: 03/16/18  0950     History   Chief Complaint Chief Complaint  Patient presents with  . Chest Pain    HPI Anita [name] is a 37 y.o. female with a history of seizures, anxiety, anemia who presents to the emergency department with a chief complaint of chest pain.  The patient endorses constant pleuritic chest pain that radiates up her neck and jaw and down her bilateral arms that began last night between approximately 5 and 6 PM.  Pain is worse when sitting forward and inspiration and alleviated by nothing.  She had some nausea this morning, which is since resolved.  She also reports that she had a generalized headache since yesterday.   She denies fever, chills, visual changes, dyspnea, syncope, lightheadedness, palpitations, lower extremity swelling, vomiting, abdominal pain, back pain, numbness, weakness, cough, URI symptoms.   She reports that she had a C-section 7 months ago for her twins, who both weight over 19 pounds currently.  She is taking an OCP.  No recent long travel.  No known falls or injuries.  She states that she does lifting her twins.   Family history includes CAD in her grandmother when she was 90.  No history of PE or DVT.   The history is provided by the patient. No language interpreter was used.    Past Medical History:  Diagnosis Date  . Anemia   . Anxiety   . Heart murmur   . Newborn product of in vitro fertilization (IVF) pregnancy   . Seizures ([place])    Last seizure in 2013    Patient Active Problem List   Diagnosis Date Noted  . Cesarean delivery delivered 08/25/2017  . Carbuncle 03/13/2015  . Contraception management 07/11/2014  . 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 Therapeutic drug monitoring 12/24/2012  . Polycystic ovaries 05/14/2012  . Dyspareunia 02/21/2011  . Seizure ([place]) 12/31/2010    Past Surgical History:    Procedure Laterality Date  . arm surgery    . CESAREAN SECTION MULTI-GESTATIONAL WITH TUBAL N/A 08/25/2017   Procedure: CESAREAN SECTION MULTI-GESTATIONAL WITH TUBAL;  Surgeon: Louretta Shorten, MD;  Location: [facility];  Service: Obstetrics;  Laterality: N/A;  Primary edc 09/08/17 need RNFA  . HYSTEROSCOPY W/D&C N/A 10/09/2015   Procedure: DILATATION AND CURETTAGE /HYSTEROSCOPY ;  Surgeon: Louretta Shorten, MD;  Location: [facility];  Service: Gynecology;  Laterality: N/A;  . LAPAROSCOPY N/A 10/09/2015   Procedure: LAPAROSCOPY DIAGNOSTIC with CO 2 laser  WITH CHROMOPERTUBATION AND LASER OF ENDOMETRIOSIS WITH LYSIS OF ADHESIONS;  Surgeon: Louretta Shorten, MD;  Location: [facility];  Service: Gynecology;  Laterality: N/A;     OB History    Gravida  1   Para  1   Term  1   Preterm      AB      Living  2     SAB      TAB      Ectopic      Multiple  1   Live Births  2            Home Medications    Prior to Admission medications   Medication Sig Start Date End Date Taking? Authorizing Provider  lacosamide (VIMPAT) 200 MG TABS tablet Take 1 tablet (200 mg total) by mouth at bedtime. 01/02/18  Yes [name], NP  LAMICTAL 200 MG tablet TAKE 1 &  1/2 TABLETS BY MOUTH TWICE DAILY Patient taking differently: TAKE 300 mg  TABLETS BY MOUTH TWICE DAILY 03/07/18  Yes [name], NP  ibuprofen (ADVIL,MOTRIN) 600 MG tablet Take 1 tablet (600 mg total) by mouth every 6 (six) hours as needed. 03/16/18   Caitlain Tweed A, PA-C  methocarbamol (ROBAXIN) 500 MG tablet Take 1 tablet (500 mg total) by mouth 2 (two) times daily. 03/16/18   Daris Harkins A, PA-C    Family History Family History  Problem Relation Age of Onset  . Healthy Mother   . Healthy Father   . Heart disease Maternal Grandmother   . Bone cancer Maternal Grandfather     Social History Social History   Tobacco Use  . Smoking status: Never Smoker  . Smokeless tobacco: Never Used  Substance Use Topics  .  Alcohol use: No  . Drug use: No     Allergies   Dye fdc red [food color pink]; Flagyl [metronidazole hcl]; Morphine; and Morphine and related   Review of Systems Review of Systems  Constitutional: Negative for activity change, chills and fever.  HENT: Negative for congestion, facial swelling, sinus pressure, sinus pain, sore throat, tinnitus and voice change.   Eyes: Negative for visual disturbance.  Respiratory: Negative for cough, shortness of breath and wheezing.   Cardiovascular: Positive for chest pain.  Gastrointestinal: Negative for abdominal pain, diarrhea, nausea and vomiting.  Genitourinary: Negative for dysuria.  Musculoskeletal: Positive for neck pain. Negative for back pain, myalgias and neck stiffness.  Skin: Negative for rash.  Allergic/Immunologic: Negative for immunocompromised state.  Neurological: Positive for headaches. Negative for dizziness, weakness and numbness.  Psychiatric/Behavioral: Negative for confusion.     Physical Exam Updated Vital Signs BP 126/74 (BP Location: Right Arm)   Pulse 89   Temp 98.3 F (36.8 C) (Oral)   Resp 18   LMP 03/01/2018   SpO2 100%   Physical Exam  Constitutional: No distress.  HENT:  Head: Normocephalic.  Eyes: Pupils are equal, round, and reactive to light. Conjunctivae and EOM are normal.  Neck: Normal range of motion. Neck supple. No JVD present.  Cardiovascular: Normal rate, regular rhythm and intact distal pulses. Exam reveals no gallop and no friction rub.  Murmur heard. Pulses:      Carotid pulses are 2+ on the right side, and 2+ on the left side.      Radial pulses are 2+ on the right side, and 2+ on the left side.       Dorsalis pedis pulses are 2+ on the right side, and 2+ on the left side.       Posterior tibial pulses are 2+ on the right side, and 2+ on the left side.  Pulmonary/Chest: Effort normal. No stridor. No respiratory distress. She has no wheezes. She has no rales. She exhibits no tenderness.    Tender to palpation over the sternum.  Pain is not increased with pushing or pulling of the bilateral upper extremities. NVI the bilateral upper extremities.  Abdominal: Soft. She exhibits no distension and no mass. There is no tenderness. There is no rebound and no guarding. No hernia.  Lymphadenopathy:    She has no cervical adenopathy.  Neurological: She is alert.  Skin: Skin is warm. No rash noted.  Psychiatric: Her behavior is normal.  Nursing note and vitals reviewed.    ED Treatments / Results  Labs (all labs ordered are listed, but only abnormal results are displayed) Labs Reviewed  CBC - Abnormal;  Notable for the following components:      Result Value   Platelets 420 (*)    All other components within normal limits  BASIC METABOLIC PANEL  I-STAT TROPONIN, ED  I-STAT BETA HCG BLOOD, ED ([facility], [facility], [facility] ONLY)    EKG EKG Interpretation  Date/Time:  Friday March 16 2018 10:00:56 EDT Ventricular Rate:  97 PR Interval:  132 QRS Duration: 90 QT Interval:  340 QTC Calculation: 431 R Axis:   72 Text Interpretation:  Normal sinus rhythm no acute ST/T changes no significant change since earlier in the day or 2012 Confirmed by Sherwood Gambler [phone]) on 03/16/2018 12:02:30 PM   Radiology Dg Chest 2 View  Result Date: 03/16/2018 CLINICAL DATA:  Chest pain EXAM: CHEST - 2 VIEW COMPARISON:  05/10/2013 chest radiograph. FINDINGS: Stable cardiomediastinal silhouette with normal heart size. No pneumothorax. No pleural effusion. Lungs appear clear, with no acute consolidative airspace disease and no pulmonary edema. IMPRESSION: No active cardiopulmonary disease. Electronically Signed   By: Ilona Sorrel M.D.   On: 03/16/2018 10:54   Ct Angio Chest Pe W And/or Wo Contrast  Result Date: 03/16/2018 CLINICAL DATA:  Centralized chest pain, worse with inspiration. History of C-section 6 months ago. Evaluate for pulmonary embolism. EXAM: CT ANGIOGRAPHY CHEST WITH CONTRAST TECHNIQUE:  Multidetector CT imaging of the chest was performed using the standard protocol during bolus administration of intravenous contrast. Multiplanar CT image reconstructions and MIPs were obtained to evaluate the vascular anatomy. CONTRAST:  172mL ISOVUE-370 IOPAMIDOL (ISOVUE-370) INJECTION 76% COMPARISON:  Chest radiograph - earlier same day; CT abdomen pelvis - 12/10/2012 FINDINGS: Vascular Findings: There is adequate opacification of the pulmonary arterial system with the main pulmonary artery measuring 360 Hounsfield units. There are no discrete filling defects within the pulmonary arterial tree to suggest pulmonary embolism. Normal caliber of the main pulmonary artery. Normal heart size.  No pericardial effusion. Normal caliber of the thoracic aorta. No evidence of thoracic aortic aneurysm or dissection on this nongated examination. Conventional configuration of the aortic arch. The branch vessels of the aortic arch appear widely patent throughout their imaged course. Review of the MIP images confirms the above findings. ---------------------------------------------------------------------------------- Nonvascular Findings: Mediastinum/Lymph Nodes: Minimal amount of ill-defined soft tissue within the anterior mediastinum is favored to represent residual thymic tissue. No bulky mediastinal, hilar axillary lymphadenopathy. Lungs/Pleura: Minimal dependent subpleural ground-glass atelectasis. No discrete focal airspace opacities. No definite pleural effusion, though note, the bilateral lung bases were not imaged. No pneumothorax. The central pulmonary airways appear widely patent No discrete pulmonary nodules. Upper abdomen: Limited early arterial phase evaluation of the upper abdomen is normal. Musculoskeletal: Regional soft tissues appear normal. No acute or aggressive osseous abnormalities. Regional soft tissues appear normal. Normal appearance of the imaged portions of the thyroid gland. IMPRESSION: No explanation  for patient's centralized chest pain. Specifically, no evidence of pulmonary embolism. Electronically Signed   By: Sandi Mariscal M.D.   On: 03/16/2018 15:05    Procedures Procedures (including critical care time)  Medications Ordered in ED Medications  iopamidol (ISOVUE-370) 76 % injection (has no administration in time range)  acetaminophen (TYLENOL) tablet 1,000 mg (1,000 mg Oral Given 03/16/18 1336)  iopamidol (ISOVUE-370) 76 % injection 100 mL (100 mLs Intravenous Contrast Given 03/16/18 1440)     Initial Impression / Assessment and Plan / ED Course  I have reviewed the triage vital signs and the nursing notes.  Pertinent labs & imaging results that were available during my care of the patient  were reviewed by me and considered in my medical decision making (see chart for details).     37 year old female presenting with pleuritic chest pain, onset last night.  Patient is to be discharged with recommendation to follow up with PCP in regards to today's hospital visit. VSS, no tracheal deviation, no JVD or new murmur, RRR, breath sounds equal bilaterally, EKG without acute abnormalities, negative troponin, and negative CXR.   On arrival in the room, the patient's heart rate is 105-110.  The patient did also have a cesarean section approximately 6 months ago and is taking OCPs.  Given these risk factors, CT PE study was ordered, which was negative for PE.  Will treat the patient with anti-inflammatories for suspected musculoskeletal strain versus costochondritis.  Strict return precautions given.  The patient is hemodynamically stable and in no acute distress.  Pt appears reliable for follow up and is agreeable to discharge.   Final Clinical Impressions(s) / ED Diagnoses   Final diagnoses:  Costochondritis    ED Discharge Orders        Ordered    ibuprofen (ADVIL,MOTRIN) 600 MG tablet  Every 6 hours PRN,   Status:  Discontinued     03/16/18 1544    methocarbamol (ROBAXIN) 500 MG  tablet  2 times daily     03/16/18 1544    ibuprofen (ADVIL,MOTRIN) 600 MG tablet  Every 6 hours PRN     03/16/18 1548       Brittish Bolinger A, PA-C 03/16/18 1557    Sherwood Gambler, MD 03/18/18 1029

## 2018-03-16 NOTE — ED Triage Notes (Signed)
Pt reports sudden onset of chest pain last night that radiates to her back and arms, states that pain is worse with inspiration. Pt had c section 7 months ago, denies any recent long trips. Endorses some nausea. resp e/u, nad.

## 2018-03-16 NOTE — ED Notes (Signed)
Pt discharged to ER for further work up and evaluation. Offered patient wheel chair, patient refused, escorted to ER ambulatory by this RN.

## 2018-03-16 NOTE — ED Notes (Signed)
Pt to CT

## 2018-03-16 NOTE — ED Notes (Signed)
Pt back from CT

## 2018-03-16 NOTE — ED Triage Notes (Signed)
Pt presents with complaints of central chest pain and that is worse with a deep breath.  She just had twins 6 months ago via c section. Endorses nausea. No other associated symptoms.

## 2018-03-16 NOTE — ED Notes (Signed)
See [facility] for Pt location.

## 2018-03-16 NOTE — ED Notes (Signed)
Patient verbalizes understanding of discharge instructions. Opportunity for questioning and answers were provided. Armband removed by staff, pt discharged from ED.  

## 2018-03-16 NOTE — ED Notes (Signed)
This RN inquired about d dimer order for patient, PA Maczis advised to hold order for d dimer at this time

## 2018-04-09 MED FILL — LaMICtal 200 MG TABS: 200 | 30 days supply | Qty: 90 | Fill #1

## 2018-04-17 MED FILL — VIMPAT 200 MG TABLET: 200 | 90 days supply | Qty: 90 | Fill #1

## 2018-05-07 MED FILL — LaMICtal 200 MG TABS: 200 | 30 days supply | Qty: 90 | Fill #2

## 2018-06-11 MED FILL — LaMICtal 200 MG TABS: 200 | 30 days supply | Qty: 90 | Fill #3

## 2018-06-26 NOTE — Progress Notes (Signed)
[facility]  PATIENT: Anita [name] DOB: Jan 31, 1981   REASON FOR VISIT: Follow-up for generalized epilepsy  HISTORY FROM: Patient    HISTORY OF PRESENT ILLNESS:UPDATE 06/27/2018 CMMs. Anita [name], 37 year old female returns for follow-up with history of seizure disorder last seizure occurring in May 2016 after stopping her Klonopin abruptly.  She is currently on Vimpat 200 mg at bedtime and Lamictal brand 300 mg twice a day.  She claims she has had side effects to generic in the past.  She continues to work full-time.  She had a C-section 08/25/2017 and delivered twins.  She has no new neurologic complaints   06/01/17 CMMs. Anita [name], 37 year old female returns for followup.   She has a history of generalized seizure disorder with last seizure occurring Jan 23, 2015 after stopping her Klonopin abruptly for 1 week. She is [redacted] weeks  pregnant with twins. She is aware that she is a high risk pregnancy due to her seizure disorder  She is currently on Vimpat 200 mg at bedtime and Lamictal 300 mg in the morning and 300 at night. She continues to work full time. No other new neurologic complaints. She returns for reevaluation. She was made  aware that she will need to be seen every 3 months for labs and possible medication adjustments of her seizure medication. Her labs were stable at her last visit    HISTORY: generalized seizure disorder. Date of last seizure 08/24/2012 when she hit her head on the table after having a seizure. CT of the head was normal. She denies further staring spells, confusion, sleep disturbances, lapses of time, headache and bowel and bladder incontinence. She continues to work full-time at [employer] . She did not get her Lamictal level after last visit. She is not driving.   REVIEW OF SYSTEMS: Full 14 system review of systems performed and notable only for those listed, all others are neg:  Constitutional: neg  Cardiovascular: neg Ear/Nose/Throat: neg   Skin: neg Eyes: neg Respiratory: neg Gastroitestinal: neg  Hematology/Lymphatic: neg  Endocrine: neg Musculoskeletal:neg Allergy/Immunology: neg Neurological: History of seizure disorder Psychiatric: neg Sleep : neg   ALLERGIES: Allergies  Allergen Reactions  . Dye Fdc Red [Food Color Pink] Nausea And Vomiting  . Flagyl [Metronidazole Hcl]     GI upset  . Morphine Nausea And Vomiting  . Morphine And Related Nausea And Vomiting    HOME MEDICATIONS: Outpatient Medications Prior to Visit  Medication Sig Dispense Refill  . lacosamide (VIMPAT) 200 MG TABS tablet Take 1 tablet (200 mg total) by mouth at bedtime. 90 tablet 1  . LAMICTAL 200 MG tablet TAKE 1 & 1/2 TABLETS BY MOUTH TWICE DAILY (Patient taking differently: TAKE 300 mg  TABLETS BY MOUTH TWICE DAILY) 90 tablet 4  . ibuprofen (ADVIL,MOTRIN) 600 MG tablet Take 1 tablet (600 mg total) by mouth every 6 (six) hours as needed. (Patient not taking: Reported on 06/27/2018) 30 tablet 0  . methocarbamol (ROBAXIN) 500 MG tablet Take 1 tablet (500 mg total) by mouth 2 (two) times daily. (Patient not taking: Reported on 06/27/2018) 20 tablet 0   No facility-administered medications prior to visit.     PAST MEDICAL HISTORY: Past Medical History:  Diagnosis Date  . Anemia   . Anxiety   . Heart murmur   . Newborn product of in vitro fertilization (IVF) pregnancy   . Seizures ([place])    Last seizure in 2013    PAST SURGICAL HISTORY: Past Surgical History:  Procedure Laterality  Date  . arm surgery    . CESAREAN SECTION MULTI-GESTATIONAL WITH TUBAL N/A 08/25/2017   Procedure: CESAREAN SECTION MULTI-GESTATIONAL WITH TUBAL;  Surgeon: Louretta Shorten, MD;  Location: [facility];  Service: Obstetrics;  Laterality: N/A;  Primary edc 09/08/17 need RNFA  . HYSTEROSCOPY W/D&C N/A 10/09/2015   Procedure: DILATATION AND CURETTAGE /HYSTEROSCOPY ;  Surgeon: Louretta Shorten, MD;  Location: [facility];  Service: Gynecology;  Laterality: N/A;  .  LAPAROSCOPY N/A 10/09/2015   Procedure: LAPAROSCOPY DIAGNOSTIC with CO 2 laser  WITH CHROMOPERTUBATION AND LASER OF ENDOMETRIOSIS WITH LYSIS OF ADHESIONS;  Surgeon: Louretta Shorten, MD;  Location: [facility];  Service: Gynecology;  Laterality: N/A;    FAMILY HISTORY: Family History  Problem Relation Age of Onset  . Healthy Mother   . Healthy Father   . Heart disease Maternal Grandmother   . Bone cancer Maternal Grandfather     SOCIAL HISTORY: Social History   Socioeconomic History  . Marital status: Single    Spouse name: Not on file  . Number of children: 0  . Years of education: college  . Highest education level: Not on file  Occupational History    Employer: [employer]  . Financial resource strain: Not on file  . Food insecurity:    Worry: Not on file    Inability: Not on file  . Transportation needs:    Medical: Not on file    Non-medical: Not on file  Tobacco Use  . Smoking status: Never Smoker  . Smokeless tobacco: Never Used  Substance and Sexual Activity  . Alcohol use: No  . Drug use: No  . Sexual activity: Yes    Birth control/protection: Surgical  Lifestyle  . Physical activity:    Days per week: Not on file    Minutes per session: Not on file  . Stress: Not on file  Relationships  . Social connections:    Talks on phone: Not on file    Gets together: Not on file    Attends religious service: Not on file    Active member of club or organization: Not on file    Attends meetings of clubs or organizations: Not on file    Relationship status: Not on file  . Intimate partner violence:    Fear of current or ex partner: Not on file    Emotionally abused: Not on file    Physically abused: Not on file    Forced sexual activity: Not on file  Other Topics Concern  . Not on file  Social History Narrative   Patient lives at home with her boyfriend. Patient works full time at [employer].   Education- College   Right handed.   Caffeine- coffee four  times a week.        PHYSICAL EXAM  Vitals:   06/27/18 0803  BP: 114/70  Pulse: 84  Weight: 188 lb (85.3 kg)  Height: 5\' 7"  (1.702 m)   Body mass index is 29.44 kg/m. Generalized: Well developed, in no acute distress   Head: normocephalic and atraumatic,. Oropharynx benign  Musculoskeletal: No deformity   Neurological examination   Mentation: Alert oriented to time, place, history taking. Follows all commands speech and language fluent  Cranial nerve II-XII: Pupils were equal round reactive to light extraocular movements were full, visual field were full on confrontational test. Facial sensation and strength were normal. hearing was intact to finger rubbing bilaterally. Uvula tongue midline. head turning and shoulder shrug  were normal and symmetric.Tongue protrusion into cheek strength was normal. Motor: normal bulk and tone, full strength in the BUE, BLE, fine finger movements normal, no pronator drift. No focal weakness Coordination: finger-nose-finger, heel-to-shin bilaterally, no dysmetria, no tremor Reflexes: 1+ upper lower and symmetric, plantar responses were flexor bilaterally. Gait and Station: Rising up from seated position without assistance, normal stance, moderate stride, good arm swing, smooth turning, able to perform tiptoe, and heel walking without difficulty. Tandem gait is steady  DIAGNOSTIC DATA (LABS, IMAGING, TESTING) - I reviewed patient records, labs, notes, testing and imaging myself where available.  Lab Results  Component Value Date   WBC 9.4 03/16/2018   HGB 12.0 03/16/2018   HCT 38.6 03/16/2018   MCV 85.8 03/16/2018   PLT 420 (H) 03/16/2018    ASSESSMENT AND PLAN 37 y.o. year old female has a past medical history of Seizures; here to follow-up. Last generalized seizure occurred in 01/23/2015 after stopping her Klonopin abruptly.  She is currently well controlled on brand Lamictal and Vimpat.  PLAN: Continue Vimpat at current dose will  refill Continue Lamictal 300mg  twice daily will refill Call for seizure activity F/U in 1 year [name], [facility], [facility], [facility] [address] [place], [place] [address] [phone]

## 2018-06-27 ENCOUNTER — Ambulatory Visit: Payer: 59 | Admitting: Nurse Practitioner

## 2018-06-27 ENCOUNTER — Encounter: Payer: Self-pay | Admitting: Nurse Practitioner

## 2018-06-27 ENCOUNTER — Other Ambulatory Visit: Payer: Self-pay | Admitting: Nurse Practitioner

## 2018-06-27 VITALS — BP 114/70 | HR 84 | Ht 67.0 in | Wt 188.0 lb

## 2018-06-27 DIAGNOSIS — G40309 Generalized idiopathic epilepsy and epileptic syndromes, not intractable, without status epilepticus: Secondary | ICD-10-CM | POA: Diagnosis not present

## 2018-06-27 MED ORDER — LAMICTAL 200 MG PO TABS: ORAL_TABLET | ORAL | 11 refills | Status: DC

## 2018-06-27 MED ORDER — LACOSAMIDE 200 MG PO TABS: 200.0000 mg | ORAL_TABLET | Freq: Every day | ORAL | 1 refills | Status: DC

## 2018-06-27 MED ORDER — LACOSAMIDE 200 MG PO TABS: 200.0000 mg | ORAL_TABLET | Freq: Every day | ORAL | 3 refills | Status: DC

## 2018-06-27 NOTE — Patient Instructions (Signed)
Continue Vimpat at current dose will refill Continue Lamictal 300mg  twice daily will refill Call for seizure activity F/U in 1 year

## 2018-06-27 NOTE — Progress Notes (Signed)
I have reviewed and agreed above plan. 

## 2018-07-09 MED FILL — LaMICtal 200 MG TABS: 200 | 30 days supply | Qty: 90 | Fill #4

## 2018-07-19 MED FILL — VIMPAT 200 MG TABLET: 200 | 90 days supply | Qty: 90 | Fill #0

## 2018-08-07 MED FILL — LaMICtal 200 MG TABS: 200 | 30 days supply | Qty: 90 | Fill #0

## 2018-09-07 MED FILL — LaMICtal 200 MG TABS: 200 | 30 days supply | Qty: 90 | Fill #1

## 2018-10-05 MED FILL — LaMICtal 200 MG TABS: 200 | 30 days supply | Qty: 90 | Fill #2

## 2018-10-18 MED FILL — VIMPAT 200 MG TABLET: 200 | 90 days supply | Qty: 90 | Fill #1

## 2018-11-06 MED FILL — LaMICtal 200 MG TABS: 200 | 30 days supply | Qty: 90 | Fill #3 | Status: TO

## 2018-11-26 MED FILL — LaMICtal 200 MG TABS: 200 | 30 days supply | Qty: 90 | Fill #0

## 2019-01-04 MED FILL — LaMICtal 200 MG TABS: 200 | 30 days supply | Qty: 90 | Fill #1

## 2019-01-09 ENCOUNTER — Other Ambulatory Visit: Payer: Self-pay | Admitting: *Deleted

## 2019-01-09 NOTE — Telephone Encounter (Signed)
I called [facility] after checking drug registry.  (showed 07-09-18, #90, 07-19-18 #90, then 08-18-19 # 80) at [facility].  Pharmacy  related that pt did not pick up 07-09-18 prescription, did receive 11/21 and 12/20 and is due now.  Glitch in system (will be reaching out to change in system).

## 2019-01-14 MED ORDER — LACOSAMIDE 200 MG PO TABS: 200.0000 mg | ORAL_TABLET | Freq: Every day | ORAL | 1 refills | Status: DC

## 2019-01-14 MED FILL — VIMPAT 200 MG TABLET: 200 | 90 days supply | Qty: 90 | Fill #0

## 2019-02-06 MED FILL — LaMICtal 200 MG TABS: 200 | 30 days supply | Qty: 90 | Fill #2

## 2019-03-07 MED FILL — LaMICtal 200 MG TABS: 200 | 30 days supply | Qty: 90 | Fill #0

## 2019-03-13 DIAGNOSIS — Z01419 Encounter for gynecological examination (general) (routine) without abnormal findings: Secondary | ICD-10-CM | POA: Diagnosis not present

## 2019-03-13 DIAGNOSIS — Z800 Family history of malignant neoplasm of digestive organs: Secondary | ICD-10-CM | POA: Diagnosis not present

## 2019-03-13 DIAGNOSIS — Z8042 Family history of malignant neoplasm of prostate: Secondary | ICD-10-CM | POA: Diagnosis not present

## 2019-03-13 DIAGNOSIS — Z801 Family history of malignant neoplasm of trachea, bronchus and lung: Secondary | ICD-10-CM | POA: Diagnosis not present

## 2019-03-26 DIAGNOSIS — N941 Unspecified dyspareunia: Secondary | ICD-10-CM | POA: Diagnosis not present

## 2019-03-26 MED FILL — BLISOVI 24 FE 1-20 MG-MCG(2: 1-20 | 28 days supply | Qty: 28 | Fill #0

## 2019-04-08 MED FILL — LaMICtal 200 MG TABS: 200 | 30 days supply | Qty: 90 | Fill #1

## 2019-04-08 MED FILL — VIMPAT 200 MG TABLET: 200 | 90 days supply | Qty: 90 | Fill #0

## 2019-04-24 DIAGNOSIS — Z809 Family history of malignant neoplasm, unspecified: Secondary | ICD-10-CM | POA: Diagnosis not present

## 2019-05-08 MED FILL — LaMICtal 200 MG TABS: 200 | 30 days supply | Qty: 90 | Fill #2

## 2019-06-06 MED FILL — LaMICtal 200 MG TABS: 200 | 30 days supply | Qty: 90 | Fill #3

## 2019-07-03 ENCOUNTER — Encounter: Payer: Self-pay | Admitting: Neurology

## 2019-07-03 ENCOUNTER — Telehealth: Payer: Self-pay | Admitting: *Deleted

## 2019-07-03 ENCOUNTER — Ambulatory Visit: Payer: 59 | Admitting: Neurology

## 2019-07-03 NOTE — Telephone Encounter (Signed)
No showed follow up appointment. 

## 2019-07-08 ENCOUNTER — Other Ambulatory Visit: Payer: Self-pay | Admitting: *Deleted

## 2019-07-08 MED ORDER — LACOSAMIDE 200 MG PO TABS: 200.0000 mg | ORAL_TABLET | Freq: Every day | ORAL | 1 refills | Status: DC

## 2019-07-08 MED ORDER — LAMICTAL 200 MG PO TABS: ORAL_TABLET | ORAL | 0 refills | Status: DC

## 2019-07-08 MED FILL — VIMPAT 200 MG TABLET: 200 | 90 days supply | Qty: 90 | Fill #0

## 2019-07-08 MED FILL — LaMICtal 200 MG TABS: 200 | 30 days supply | Qty: 90 | Fill #0

## 2019-07-30 NOTE — Progress Notes (Signed)
PATIENT: Anita [name] DOB: [birth year]  REASON FOR VISIT: follow up HISTORY FROM: patient  HISTORY OF PRESENT ILLNESS: Today 07/31/19  Anita [name] is a 38 year old female with history of seizure disorder.  Her last seizure occurred in May 2016 after stopping Klonopin abruptly. She is currently taking Vimpat 200 mg at bedtime, Lamictal brand name, 300 mg twice a day.  She has not had recurrent seizure.  She works full-time as a Psychologist, sport and exercise.  She has twins who are 2.  She is tolerating medications without side effect.  She requires brand name Vimpat and Lamictal.  She denies any new problems or concerns.  She presents for follow-up unaccompanied  HISTORY HISTORY OF PRESENT ILLNESS:UPDATE 06/27/2018 CMMs. Amalia [name], 38 year old female returns for follow-up with history of seizure disorder last seizure occurring in May 2016 after stopping her Klonopin abruptly.  She is currently on Vimpat 200 mg at bedtime and Lamictal brand 300 mg twice a day.  She claims she has had side effects to generic in the past.  She continues to work full-time.  She had a C-section 08/25/2017 and delivered twins.  She has no new neurologic complaints   REVIEW OF SYSTEMS: Out of a complete 14 system review of symptoms, the patient complains only of the following symptoms, and all other reviewed systems are negative.  Seizures  ALLERGIES: Allergies  Allergen Reactions   Dye Fdc Red [Food Color Pink] Nausea And Vomiting   Flagyl [Metronidazole Hcl]     GI upset   Morphine Nausea And Vomiting   Morphine And Related Nausea And Vomiting    HOME MEDICATIONS: Outpatient Medications Prior to Visit  Medication Sig Dispense Refill   lacosamide (VIMPAT) 200 MG TABS tablet Take 1 tablet (200 mg total) by mouth at bedtime. 90 tablet 1   LAMICTAL 200 MG tablet TAKE 1.5 TABLETS BY MOUTH TWICE DAILY 90 tablet 0   ibuprofen (ADVIL,MOTRIN) 600 MG tablet Take 1 tablet (600 mg total) by mouth every 6 (six) hours as needed.  (Patient not taking: Reported on 06/27/2018) 30 tablet 0   methocarbamol (ROBAXIN) 500 MG tablet Take 1 tablet (500 mg total) by mouth 2 (two) times daily. (Patient not taking: Reported on 06/27/2018) 20 tablet 0   No facility-administered medications prior to visit.     PAST MEDICAL HISTORY: Past Medical History:  Diagnosis Date   Anemia    Anxiety    Heart murmur    Newborn product of in vitro fertilization (IVF) pregnancy    Seizures ([place])    Last seizure in 2013    PAST SURGICAL HISTORY: Past Surgical History:  Procedure Laterality Date   arm surgery     CESAREAN SECTION MULTI-GESTATIONAL WITH TUBAL N/A 08/25/2017   Procedure: CESAREAN SECTION MULTI-GESTATIONAL WITH TUBAL;  Surgeon: Louretta Shorten, MD;  Location: [facility];  Service: Obstetrics;  Laterality: N/A;  Primary edc 09/08/17 need RNFA   HYSTEROSCOPY W/D&C N/A 10/09/2015   Procedure: DILATATION AND CURETTAGE /HYSTEROSCOPY ;  Surgeon: Louretta Shorten, MD;  Location: [facility];  Service: Gynecology;  Laterality: N/A;   LAPAROSCOPY N/A 10/09/2015   Procedure: LAPAROSCOPY DIAGNOSTIC with CO 2 laser  WITH CHROMOPERTUBATION AND LASER OF ENDOMETRIOSIS WITH LYSIS OF ADHESIONS;  Surgeon: Louretta Shorten, MD;  Location: [facility];  Service: Gynecology;  Laterality: N/A;    FAMILY HISTORY: Family History  Problem Relation Age of Onset   Healthy Mother    Healthy Father    Heart disease Maternal Grandmother    Bone  cancer Maternal Grandfather     SOCIAL HISTORY: Social History   Socioeconomic History   Marital status: Single    Spouse name: Not on file   Number of children: 0   Years of education: college   Highest education level: Not on file  Occupational History    Employer: [employer]  Social Needs   Financial resource strain: Not on file   Food insecurity    Worry: Not on file    Inability: Not on file   Transportation needs    Medical: Not on file    Non-medical: Not on file  Tobacco Use    Smoking status: Never Smoker   Smokeless tobacco: Never Used  Substance and Sexual Activity   Alcohol use: No   Drug use: No   Sexual activity: Yes    Birth control/protection: Surgical  Lifestyle   Physical activity    Days per week: Not on file    Minutes per session: Not on file   Stress: Not on file  Relationships   Social connections    Talks on phone: Not on file    Gets together: Not on file    Attends religious service: Not on file    Active member of club or organization: Not on file    Attends meetings of clubs or organizations: Not on file    Relationship status: Not on file   Intimate partner violence    Fear of current or ex partner: Not on file    Emotionally abused: Not on file    Physically abused: Not on file    Forced sexual activity: Not on file  Other Topics Concern   Not on file  Social History Narrative   Patient lives at home with her boyfriend. Patient works full time at [employer].   Education- College   Right handed.   Caffeine- coffee four times a week.   Trying to get pregnant.  05-13-15   PHYSICAL EXAM  Vitals:   07/31/19 0741  BP: 134/82  Pulse: 79  Temp: 97.6 F (36.4 C)  TempSrc: Oral  Weight: 201 lb 9.6 oz (91.4 kg)  Height: 5\' 7"  (1.702 m)   Body mass index is 31.58 kg/m.  Generalized: Well developed, in no acute distress   Neurological examination  Mentation: Alert oriented to time, place, history taking. Follows all commands speech and language fluent Cranial nerve II-XII: Pupils were equal round reactive to light. Extraocular movements were full, visual field were full on confrontational test. Facial sensation and strength were normal.  Head turning and shoulder shrug  were normal and symmetric. Motor: The motor testing reveals 5 over 5 strength of all 4 extremities. Good symmetric motor tone is noted throughout.  Sensory: Sensory testing is intact to soft touch on all 4 extremities. No evidence of extinction is  noted.  Coordination: Cerebellar testing reveals good finger-nose-finger and heel-to-shin bilaterally.  Gait and station: Gait is normal. Tandem gait is normal.  Reflexes: Deep tendon reflexes are symmetric and normal bilaterally.   DIAGNOSTIC DATA (LABS, IMAGING, TESTING) - I reviewed patient records, labs, notes, testing and imaging myself where available.  Lab Results  Component Value Date   WBC 9.4 03/16/2018   HGB 12.0 03/16/2018   HCT 38.6 03/16/2018   MCV 85.8 03/16/2018   PLT 420 (H) 03/16/2018      Component Value Date/Time   NA 138 03/16/2018 1016   K 3.9 03/16/2018 1016   CL 105 03/16/2018 1016  CO2 25 03/16/2018 1016   GLUCOSE 90 03/16/2018 1016   BUN 8 03/16/2018 1016   CREATININE 0.72 03/16/2018 1016   CREATININE 0.67 05/20/2014 1009   CALCIUM 9.1 03/16/2018 1016   GFRNONAA >60 03/16/2018 1016   GFRAA >60 03/16/2018 1016   No results found for: CHOL, HDL, LDLCALC, LDLDIRECT, TRIG, CHOLHDL No results found for: HGBA1C No results found for: VITAMINB12 Lab Results  Component Value Date   TSH 1.550 11/29/2012    ASSESSMENT AND PLAN 38 y.o. year old female  has a past medical history of Anemia, Anxiety, Heart murmur, Newborn product of in vitro fertilization (IVF) pregnancy, and Seizures ([place]). here with:  1.  Seizures -Most recent seizure was in May 2016 -Continue Vimpat 200 mg at bedtime -Continue Lamictal 300 mg twice a day -Check routine lab work today, including Lamictal level -She requires brand name Vimpat and Lamictal -Follow-up in 1 year or sooner if needed, call for recurrent seizure  I spent 15 minutes with the patient. 50% of this time was spent discussing her plan of care.   Butler Denmark, AGNP-C, DNP 07/31/2019, 7:47 AM [facility] [address] [place],  [address] [phone]

## 2019-07-31 ENCOUNTER — Ambulatory Visit (INDEPENDENT_AMBULATORY_CARE_PROVIDER_SITE_OTHER): Payer: 59 | Admitting: Neurology

## 2019-07-31 ENCOUNTER — Encounter: Payer: Self-pay | Admitting: Neurology

## 2019-07-31 ENCOUNTER — Other Ambulatory Visit: Payer: Self-pay

## 2019-07-31 VITALS — BP 134/82 | HR 79 | Temp 97.6°F | Ht 67.0 in | Wt 201.6 lb

## 2019-07-31 DIAGNOSIS — G40309 Generalized idiopathic epilepsy and epileptic syndromes, not intractable, without status epilepticus: Secondary | ICD-10-CM | POA: Diagnosis not present

## 2019-07-31 MED ORDER — LACOSAMIDE 200 MG PO TABS: 200.0000 mg | ORAL_TABLET | Freq: Every day | ORAL | 1 refills | Status: DC

## 2019-07-31 MED ORDER — LAMICTAL 200 MG PO TABS: ORAL_TABLET | ORAL | 11 refills | Status: DC

## 2019-07-31 NOTE — Patient Instructions (Signed)
Everything look great today!  Continue current medications  I will check lab work today  Call for recurrent seizure   Return in 1 year   Merry Christmas!

## 2019-08-01 LAB — COMPREHENSIVE METABOLIC PANEL
ALT: 13 IU/L (ref 0–32)
AST: 14 IU/L (ref 0–40)
Albumin/Globulin Ratio: 1.5 (ref 1.2–2.2)
Albumin: 4.4 g/dL (ref 3.8–4.8)
Alkaline Phosphatase: 100 IU/L (ref 39–117)
BUN/Creatinine Ratio: 14 (ref 9–23)
BUN: 9 mg/dL (ref 6–20)
Bilirubin Total: 0.2 mg/dL (ref 0.0–1.2)
CO2: 20 mmol/L (ref 20–29)
Calcium: 9.8 mg/dL (ref 8.7–10.2)
Chloride: 103 mmol/L (ref 96–106)
Creatinine, Ser: 0.66 mg/dL (ref 0.57–1.00)
GFR calc Af Amer: 129 mL/min/{1.73_m2} (ref >59)
GFR calc non Af Amer: 112 mL/min/{1.73_m2} (ref >59)
Globulin, Total: 2.9 g/dL (ref 1.5–4.5)
Glucose: 86 mg/dL (ref 65–99)
Potassium: 4.2 mmol/L (ref 3.5–5.2)
Sodium: 138 mmol/L (ref 134–144)
Total Protein: 7.3 g/dL (ref 6.0–8.5)

## 2019-08-01 LAB — CBC WITH DIFFERENTIAL/PLATELET
Basophils Absolute: 0.1 10*3/uL (ref 0.0–0.2)
Basos: 1 %
EOS (ABSOLUTE): 0.1 10*3/uL (ref 0.0–0.4)
Eos: 1 %
Hematocrit: 39.1 % (ref 34.0–46.6)
Hemoglobin: 13.2 g/dL (ref 11.1–15.9)
Immature Grans (Abs): 0 10*3/uL (ref 0.0–0.1)
Immature Granulocytes: 0 %
Lymphocytes Absolute: 2.1 10*3/uL (ref 0.7–3.1)
Lymphs: 31 %
MCH: 28.3 pg (ref 26.6–33.0)
MCHC: 33.8 g/dL (ref 31.5–35.7)
MCV: 84 fL (ref 79–97)
Monocytes Absolute: 0.7 10*3/uL (ref 0.1–0.9)
Monocytes: 11 %
Neutrophils Absolute: 3.8 10*3/uL (ref 1.4–7.0)
Neutrophils: 56 %
Platelets: 508 10*3/uL — ABNORMAL HIGH (ref 150–450)
RBC: 4.66 x10E6/uL (ref 3.77–5.28)
RDW: 13.8 % (ref 11.7–15.4)
WBC: 6.8 10*3/uL (ref 3.4–10.8)

## 2019-08-01 LAB — LAMOTRIGINE LEVEL: Lamotrigine Lvl: 10.7 ug/mL (ref 2.0–20.0)

## 2019-08-01 MED FILL — LaMICtal 200 MG TABS: 200 | 30 days supply | Qty: 90 | Fill #0

## 2019-08-13 ENCOUNTER — Telehealth: Payer: Self-pay | Admitting: *Deleted

## 2019-08-13 NOTE — Telephone Encounter (Signed)
Yes, please have her discuss with her primary doctor. I do not see that could be caused by Lamictal or Vimpat.

## 2019-08-13 NOTE — Telephone Encounter (Signed)
-----   Message from Suzzanne Cloud, NP sent at 08/05/2019 12:46 PM EST ----- Please call the patient.  Lamictal level is therapeutic.  Please discuss elevated platelet 508, appears chronic since 09/14/2017. Check with her to see if any reason for this? I think it correlates with the birth of her twins?  Has been on same doses of Vimpat and Lamictal, platelets were normal prior.

## 2019-08-13 NOTE — Telephone Encounter (Signed)
I called pt and relayed per SS/NP the results notes for her re: her lamictal level good.   Plts elevated since 09-14-2017, pt not sure why this is, has not seen pcp for over 5 yrs now. Twins born in 2018.   She will see about getting one.  Normal level plts since on same doses of vimpat /lamictal.  I would ask SS/NP and let back with her via [employer].  Pt verbalized understanding.

## 2019-08-14 NOTE — Progress Notes (Signed)
If she continues to do well, may consider tapering down her antiepileptics

## 2019-08-21 ENCOUNTER — Encounter: Payer: Self-pay | Admitting: Family Medicine

## 2019-08-21 ENCOUNTER — Other Ambulatory Visit: Payer: Self-pay

## 2019-08-21 ENCOUNTER — Ambulatory Visit (INDEPENDENT_AMBULATORY_CARE_PROVIDER_SITE_OTHER): Payer: 59 | Admitting: Family Medicine

## 2019-08-21 VITALS — BP 102/63 | HR 88 | Temp 98.4°F | Ht 66.5 in | Wt 196.0 lb

## 2019-08-21 DIAGNOSIS — D473 Essential (hemorrhagic) thrombocythemia: Secondary | ICD-10-CM | POA: Diagnosis not present

## 2019-08-21 DIAGNOSIS — Z0000 Encounter for general adult medical examination without abnormal findings: Secondary | ICD-10-CM | POA: Diagnosis not present

## 2019-08-21 DIAGNOSIS — D75839 Thrombocytosis, unspecified: Secondary | ICD-10-CM

## 2019-08-21 DIAGNOSIS — G40309 Generalized idiopathic epilepsy and epileptic syndromes, not intractable, without status epilepticus: Secondary | ICD-10-CM

## 2019-08-21 NOTE — Patient Instructions (Signed)
Great to see you! I will send you a lab note. You look great!

## 2019-08-22 LAB — CBC WITH DIFFERENTIAL/PLATELET
Basophils Absolute: 0.1 10*3/uL (ref 0.0–0.2)
Basos: 1 %
EOS (ABSOLUTE): 0.1 10*3/uL (ref 0.0–0.4)
Eos: 2 %
Hematocrit: 43 % (ref 34.0–46.6)
Hemoglobin: 13.8 g/dL (ref 11.1–15.9)
Immature Grans (Abs): 0 10*3/uL (ref 0.0–0.1)
Immature Granulocytes: 1 %
Lymphocytes Absolute: 2.3 10*3/uL (ref 0.7–3.1)
Lymphs: 32 %
MCH: 27.4 pg (ref 26.6–33.0)
MCHC: 32.1 g/dL (ref 31.5–35.7)
MCV: 86 fL (ref 79–97)
Monocytes Absolute: 0.7 10*3/uL (ref 0.1–0.9)
Monocytes: 10 %
Neutrophils Absolute: 3.9 10*3/uL (ref 1.4–7.0)
Neutrophils: 54 %
Platelets: 505 10*3/uL — ABNORMAL HIGH (ref 150–450)
RBC: 5.03 x10E6/uL (ref 3.77–5.28)
RDW: 13.6 % (ref 11.7–15.4)
WBC: 7.2 10*3/uL (ref 3.4–10.8)

## 2019-08-22 LAB — LIPID PANEL
Chol/HDL Ratio: 3 ratio (ref 0.0–4.4)
Cholesterol, Total: 234 mg/dL — ABNORMAL HIGH (ref 100–199)
HDL: 78 mg/dL (ref >39)
LDL Chol Calc (NIH): 145 mg/dL — ABNORMAL HIGH (ref 0–99)
Triglycerides: 64 mg/dL (ref 0–149)
VLDL Cholesterol Cal: 11 mg/dL (ref 5–40)

## 2019-08-22 LAB — VITAMIN D 25 HYDROXY (VIT D DEFICIENCY, FRACTURES): Vit D, 25-Hydroxy: 10 ng/mL — ABNORMAL LOW (ref 30.0–100.0)

## 2019-08-25 DIAGNOSIS — D75839 Thrombocytosis, unspecified: Secondary | ICD-10-CM | POA: Insufficient documentation

## 2019-08-25 DIAGNOSIS — D473 Essential (hemorrhagic) thrombocythemia: Secondary | ICD-10-CM | POA: Insufficient documentation

## 2019-08-25 NOTE — Progress Notes (Signed)
    CHIEF COMPLAINT / HPI: Here for checkup.  Her neurologist wanted her to reconnect with primary care provider as her platelets have been increasing gradually over the last couple of years.  Her neurologist follows her for epilepsy.  She is doing well.  Her twins are going to be 39 years old on 28 December.  She is working part-time.  REVIEW OF SYSTEMS: Review of Systems  Constitutional: Negative for activity chang; no  appetite change and no unexpected weight change.  Eyes: Negative for eye pain and no visual disturbance.  Neck: denies neck pain; no swallowing problems CV: No chest pain, no shortness of breath, no lower extremity edema. No change in exercise tolerance Respiratory: Negative for cough or wheezing.  No shortness of breath. Gastrointestinal: Negative for abdominal pain, no diarrhea and no  constipation.  Genitourinary: Negative for decreased urine volume and  no difficulty urinating.  Musculoskeletal: Negative for arthralgias. No muscle weakness. Skin: Negative for rash.  Psychiatric/Behavioral: Negative for behavioral problems; no sleep disturbance and no  agitation.     PERTINENT  PMH / PSH: I have reviewed the patient's medications, allergies, past medical and surgical history, smoking status and updated in the EMR as appropriate. Epilepsy History of heart murmur  OBJECTIVE:  Vital signs reviewed GENERALl: Well developed, well nourished, in no acute distress. HEENT: PERRLA, EOMI, sclerae are nonicteric NECK: Supple, FROM, without lymphadenopathy.  THYROID: normal without nodularity CAROTID ARTERIES: without bruits LUNGS: clear to auscultation bilaterally. No wheezes or rales. Normal respiratory effort HEART: Regular rate and rhythm, no murmurs. Distal pulses are bilaterally symmetrical, 2+. ABDOMEN: soft with positive bowel sounds. No masses noted MSK: MOE x 4. Normal muscle strength, bulk and tone. SKIN no rash. Normal temperature. NEURO: no focal deficits.  Normal gait. Normal balance.   ASSESSMENT / PLAN:   No problem-specific Assessment & Plan notes found for this encounter.

## 2019-08-25 NOTE — Assessment & Plan Note (Signed)
refer

## 2019-08-25 NOTE — Assessment & Plan Note (Signed)
Followed by neurology.   

## 2019-08-25 NOTE — Assessment & Plan Note (Addendum)
Recent Pap at her OB/GYN.  No problems. General labs.

## 2019-08-26 ENCOUNTER — Encounter: Payer: Self-pay | Admitting: Family Medicine

## 2019-08-29 ENCOUNTER — Encounter: Payer: Self-pay | Admitting: Family Medicine

## 2019-09-05 MED FILL — LaMICtal 200 MG TABS: 200 | 30 days supply | Qty: 90 | Fill #1

## 2019-09-13 ENCOUNTER — Telehealth: Payer: Self-pay | Admitting: Hematology

## 2019-09-13 NOTE — Telephone Encounter (Signed)
A new hem appt has been scheduled for Anita [name] to see Dr. Irene Limbo on 1/25 at 10am. Pt aware to arrive 15 minutes early.

## 2019-09-23 ENCOUNTER — Other Ambulatory Visit: Payer: Self-pay

## 2019-09-23 ENCOUNTER — Inpatient Hospital Stay: Payer: No Typology Code available for payment source | Attending: Hematology | Admitting: Hematology

## 2019-09-23 ENCOUNTER — Inpatient Hospital Stay: Payer: No Typology Code available for payment source

## 2019-09-23 VITALS — BP 128/81 | HR 88 | Temp 97.8°F | Resp 18 | Ht 66.5 in | Wt 198.6 lb

## 2019-09-23 DIAGNOSIS — D473 Essential (hemorrhagic) thrombocythemia: Secondary | ICD-10-CM | POA: Diagnosis present

## 2019-09-23 DIAGNOSIS — D649 Anemia, unspecified: Secondary | ICD-10-CM | POA: Insufficient documentation

## 2019-09-23 DIAGNOSIS — D75839 Thrombocytosis, unspecified: Secondary | ICD-10-CM

## 2019-09-23 DIAGNOSIS — E559 Vitamin D deficiency, unspecified: Secondary | ICD-10-CM | POA: Diagnosis not present

## 2019-09-23 LAB — CMP (CANCER CENTER ONLY)
ALT: 14 U/L (ref 0–44)
AST: 15 U/L (ref 15–41)
Albumin: 4.3 g/dL (ref 3.5–5.0)
Alkaline Phosphatase: 82 U/L (ref 38–126)
Anion gap: 11 (ref 5–15)
BUN: 11 mg/dL (ref 6–20)
CO2: 23 mmol/L (ref 22–32)
Calcium: 9.6 mg/dL (ref 8.9–10.3)
Chloride: 104 mmol/L (ref 98–111)
Creatinine: 0.77 mg/dL (ref 0.44–1.00)
GFR, Est AFR Am: >60 mL/min (ref >60)
GFR, Estimated: >60 mL/min (ref >60)
Glucose, Bld: 87 mg/dL (ref 70–99)
Potassium: 3.7 mmol/L (ref 3.5–5.1)
Sodium: 138 mmol/L (ref 135–145)
Total Bilirubin: 0.3 mg/dL (ref 0.3–1.2)
Total Protein: 8.2 g/dL — ABNORMAL HIGH (ref 6.5–8.1)

## 2019-09-23 LAB — CBC WITH DIFFERENTIAL/PLATELET
Abs Immature Granulocytes: 0.03 10*3/uL (ref 0.00–0.07)
Basophils Absolute: 0.1 10*3/uL (ref 0.0–0.1)
Basophils Relative: 1 %
Eosinophils Absolute: 0 10*3/uL (ref 0.0–0.5)
Eosinophils Relative: 0 %
HCT: 42 % (ref 36.0–46.0)
Hemoglobin: 13.7 g/dL (ref 12.0–15.0)
Immature Granulocytes: 0 %
Lymphocytes Relative: 22 %
Lymphs Abs: 2.2 10*3/uL (ref 0.7–4.0)
MCH: 28.1 pg (ref 26.0–34.0)
MCHC: 32.6 g/dL (ref 30.0–36.0)
MCV: 86.2 fL (ref 80.0–100.0)
Monocytes Absolute: 0.8 10*3/uL (ref 0.1–1.0)
Monocytes Relative: 8 %
Neutro Abs: 6.9 10*3/uL (ref 1.7–7.7)
Neutrophils Relative %: 69 %
Platelets: 489 10*3/uL — ABNORMAL HIGH (ref 150–400)
RBC: 4.87 MIL/uL (ref 3.87–5.11)
RDW: 13.9 % (ref 11.5–15.5)
WBC: 10 10*3/uL (ref 4.0–10.5)
nRBC: 0 % (ref 0.0–0.2)

## 2019-09-23 LAB — IRON AND TIBC
Iron: 38 ug/dL — ABNORMAL LOW (ref 41–142)
Saturation Ratios: 11 % — ABNORMAL LOW (ref 21–57)
TIBC: 359 ug/dL (ref 236–444)
UIBC: 320 ug/dL (ref 120–384)

## 2019-09-23 LAB — SEDIMENTATION RATE: Sed Rate: 4 mm/hr (ref 0–22)

## 2019-09-23 LAB — FERRITIN: Ferritin: 7 ng/mL — ABNORMAL LOW (ref 11–307)

## 2019-09-23 NOTE — Patient Instructions (Signed)
Thank you for choosing [facility] to provide your oncology and hematology care.   Should you have questions after your visit to the [facility] ([facility]), please contact this office at [phone] between 8:30 AM and 4:30 PM.  Voice mails left after 4:00 PM may not be returned until the following business day.  Calls received after 4:30 PM will be answered by an off-site Nurse Triage Line.    Prescription Refills:  Please have your pharmacy contact us directly for most prescription requests.  Contact the office directly for refills of narcotics (pain medications). Allow 48-72 hours for refills.  Appointments: Please contact the [facility] scheduling department [phone] for questions regarding [facility] appointment scheduling.  Contact the schedulers with any scheduling changes so that your appointment can be rescheduled in a timely manner.   Central Scheduling for [facility] [phone] - Call to schedule procedures such as PET scans, CT scans, MRI, Ultrasound, etc.  To afford each patient quality time with our providers, please arrive 30 minutes before your scheduled appointment time.  If you arrive late for your appointment, you may be asked to reschedule.  We strive to give you quality time with our providers, and arriving late affects you and other patients whose appointments are after yours. If you are a no show for multiple scheduled visits, you may be dismissed from the clinic at the providers discretion.     Resources: [facility] Social Workers [phone] for additional information on assistance programs or assistance connecting with community support programs   [place] DSS  [phone]: Information regarding food stamps, Medicaid, and utility assistance [place] [phone]   [place] [employer]'s shared-ride transportation service for eligible riders who have a disability that prevents them from riding the fixed route bus.   Medicare Rights Center  800-333-4114 Helps people with Medicare understand their rights and benefits, navigate the Medicare system, and secure the quality healthcare they deserve [employer] 800-227-2345 Assists patients locate various types of support and financial assistance Cancer Care: 1-800-813-HOPE (4673) Provides financial assistance, online support groups, medication/co-pay assistance.   Transportation Assistance for appointments at [facility]: Transportation Coordinator [phone]  Again, thank you for choosing [facility] for your care.       

## 2019-09-23 NOTE — Progress Notes (Signed)
HEMATOLOGY/ONCOLOGY CONSULTATION NOTE  Date of Service: 09/23/2019  Patient Care Team: Dickie La, MD as PCP - General (Family Medicine)  CHIEF COMPLAINTS/PURPOSE OF CONSULTATION:   Thrombocytosis  HISTORY OF PRESENTING ILLNESS:   Anita [name] is a wonderful 39 y.o. female who has been referred to [place] by Dr Dorcas Mcmurray for evaluation and management of thrombocytosis. The pt reports that she is doing well overall.  The pt reports that she has a long history of anemia. Pt had twins via C-section in 07/2017. She received oral iron due to severe anemia directly after birth. She continued to bleed for about 2 months post-birth. Pt was told to take Ergocalciferol and has been less than diligent in taking it. She has not felt any differently over the last two years except for fatigue that can be attributed to having two very young children.   Pt has a history of seizures and no clear cause has been identified. The current theory is that due to a long Chickenpox infection when the pt was young, there could be some element of encephalopathy that could have been causing her seizures. Pt has been on Vimpat since 2013, and Lamictal for over 10 years. She has been seizure free for quite some time.  She has a history of endometriosis and used IVF to conceive her children. Pt does not take other medications regularly, nor herbal supplements. She denies any known family history of blood disorders. She has not had any kind of chronic joint pain, mouth pain, jaw pain or other chronic body pain. Pt is not using any birth control and has had a tubal ligation. Pt had no previous concerns about her thyroid function.  Pt was having centralized chest pains in July of 2019 and had a CT Chest on 03/16/2018. There was nothing of note found on the scan. Pt has continued to have intermittent chest pains but also has a significant amount of stress in her life.   Most recent lab results (08/21/2019) of CBC w/diff is as  follows: all values are WNL except for PLT at 505K. 08/21/2019 Vitamin D 25-hydroxy at 10.0  On review of systems, pt reports chest pain, fatigue, stress and denies joint pain, jaw pain, mouth pain, abdominal pain, leg swelling and any other symptoms.   On PMHx the pt reports Anemia, Seizures, IVF pregnancy, C-section, Tubal Ligation, Endometriosis, Chickenpox.  MEDICAL HISTORY:  Past Medical History:  Diagnosis Date  . Anemia   . Anxiety   . Heart murmur   . Newborn product of in vitro fertilization (IVF) pregnancy   . Seizures ([place])    Last seizure in 2013    SURGICAL HISTORY: Past Surgical History:  Procedure Laterality Date  . arm surgery    . CESAREAN SECTION MULTI-GESTATIONAL WITH TUBAL N/A 08/25/2017   Procedure: CESAREAN SECTION MULTI-GESTATIONAL WITH TUBAL;  Surgeon: Louretta Shorten, MD;  Location: [place];  Service: Obstetrics;  Laterality: N/A;  Primary edc 09/08/17 need RNFA  . HYSTEROSCOPY WITH D & C N/A 10/09/2015   Procedure: DILATATION AND CURETTAGE /HYSTEROSCOPY ;  Surgeon: Louretta Shorten, MD;  Location: [facility];  Service: Gynecology;  Laterality: N/A;  . LAPAROSCOPY N/A 10/09/2015   Procedure: LAPAROSCOPY DIAGNOSTIC with CO 2 laser  WITH CHROMOPERTUBATION AND LASER OF ENDOMETRIOSIS WITH LYSIS OF ADHESIONS;  Surgeon: Louretta Shorten, MD;  Location: [facility];  Service: Gynecology;  Laterality: N/A;    SOCIAL HISTORY: Social History   Socioeconomic History  . Marital status: Married  Spouse name: Not on file  . Number of children: 0  . Years of education: college  . Highest education level: Not on file  Occupational History    Employer: [employer]  Tobacco Use  . Smoking status: Never Smoker  . Smokeless tobacco: Never Used  Substance and Sexual Activity  . Alcohol use: No  . Drug use: No  . Sexual activity: Yes    Birth control/protection: Surgical  Other Topics Concern  . Not on file  Social History Narrative   Patient lives at home with her  boyfriend. Patient works full time at [employer].   Education- College   Right handed.   Caffeine- coffee four times a week.   Trying to get pregnant.  05-13-15   Social Determinants of Health   Financial Resource Strain:   . Difficulty of Paying Living Expenses: Not on file  Food Insecurity:   . Worried About Charity fundraiser in the Last Year: Not on file  . Ran Out of Food in the Last Year: Not on file  Transportation Needs:   . Lack of Transportation (Medical): Not on file  . Lack of Transportation (Non-Medical): Not on file  Physical Activity:   . Days of Exercise per Week: Not on file  . Minutes of Exercise per Session: Not on file  Stress:   . Feeling of Stress : Not on file  Social Connections:   . Frequency of Communication with Friends and Family: Not on file  . Frequency of Social Gatherings with Friends and Family: Not on file  . Attends Religious Services: Not on file  . Active Member of Clubs or Organizations: Not on file  . Attends Archivist Meetings: Not on file  . Marital Status: Not on file  Intimate Partner Violence:   . Fear of Current or Ex-Partner: Not on file  . Emotionally Abused: Not on file  . Physically Abused: Not on file  . Sexually Abused: Not on file    FAMILY HISTORY: Family History  Problem Relation Age of Onset  . Healthy Mother   . Healthy Father   . Heart disease Maternal Grandmother   . Bone cancer Maternal Grandfather     ALLERGIES:  is allergic to dye fdc red [food color pink]; flagyl [metronidazole hcl]; morphine; and morphine and related.  MEDICATIONS:  Current Outpatient Medications  Medication Sig Dispense Refill  . lacosamide (VIMPAT) 200 MG TABS tablet Take 1 tablet (200 mg total) by mouth at bedtime. 90 tablet 1  . LAMICTAL 200 MG tablet TAKE 1.5 TABLETS BY MOUTH TWICE DAILY 90 tablet 11   No current facility-administered medications for this visit.    REVIEW OF SYSTEMS:    10 Point review of Systems  was done is negative except as noted above.  PHYSICAL EXAMINATION: ECOG PERFORMANCE STATUS: 0 - Asymptomatic  . Vitals:   09/23/19 1020  BP: 128/81  Pulse: 88  Resp: 18  Temp: 97.8 F (36.6 C)  SpO2: 97%   Filed Weights   09/23/19 1020  Weight: 198 lb 9.6 oz (90.1 kg)   .Body mass index is 31.57 kg/m.  GENERAL:alert, in no acute distress and comfortable SKIN: no acute rashes, no significant lesions EYES: conjunctiva are pink and non-injected, sclera anicteric OROPHARYNX: MMM, no exudates, no oropharyngeal erythema or ulceration NECK: supple, no JVD LYMPH:  no palpable lymphadenopathy in the cervical, axillary or inguinal regions LUNGS: clear to auscultation b/l with normal respiratory effort HEART: regular rate &  rhythm ABDOMEN:  normoactive bowel sounds , non tender, not distended. Extremity: no pedal edema PSYCH: alert & oriented x 3 with fluent speech NEURO: no focal motor/sensory deficits  LABORATORY DATA:  I have reviewed the data as listed  . CBC Latest Ref Rng & Units 09/23/2019 08/21/2019 07/31/2019  WBC 4.0 - 10.5 K/uL 10.0 7.2 6.8  Hemoglobin 12.0 - 15.0 g/dL 13.7 13.8 13.2  Hematocrit 36.0 - 46.0 % 42.0 43.0 39.1  Platelets 150 - 400 K/uL 489(H) 505(H) 508(H)    . CMP Latest Ref Rng & Units 07/31/2019 03/16/2018 05/20/2014  Glucose 65 - 99 mg/dL 86 90 80  BUN 6 - 20 mg/dL 9 8 10   Creatinine 0.57 - 1.00 mg/dL 0.66 0.72 0.67  Sodium 134 - 144 mmol/L 138 138 137  Potassium 3.5 - 5.2 mmol/L 4.2 3.9 4.0  Chloride 96 - 106 mmol/L 103 105 103  CO2 20 - 29 mmol/L 20 25 27   Calcium 8.7 - 10.2 mg/dL 9.8 9.1 9.7  Total Protein 6.0 - 8.5 g/dL 7.3 - -  Total Bilirubin 0.0 - 1.2 mg/dL 0.2 - -  Alkaline Phos 39 - 117 IU/L 100 - -  AST 0 - 40 IU/L 14 - -  ALT 0 - 32 IU/L 13 - -    Component     Latest Ref Rng & Units 10/09/2015 08/24/2017 08/26/2017 08/27/2017  Platelets     150 - 450 x10E3/uL 376 299 248 261   Component     Latest Ref Rng & Units 09/14/2017  03/16/2018 07/31/2019 08/21/2019  Platelets     150 - 450 x10E3/uL 590 (H) 420 (H) 508 (H) 505 (H)    RADIOGRAPHIC STUDIES: I have personally reviewed the radiological images as listed and agreed with the findings in the report. No results found.  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 -Discussed 08/21/2019 Vitamin D 25-hydroxy is very low at 10.0 -No splenomegaly on clinical exam -Discussed that thrombocytosis could be due to a reactive process or a primary bone marrow problem -Discussed that a reactive process could be caused by iron deficiency, blood loss, chronic infection or medications -Discussed that PLT have remained steady and are not slowly increasing, other blood counts are also normal -More likely to be a reactive process based on labs, Hx, exam and age  -Will need to r/o Essential Thrombocytosis -Continue Ergocalciferol for vit d deficiency. -Will get labs today -Will see back in 2 weeks via phone   FOLLOW UP: Labs today Phone visit with Dr Irene Limbo in 2 weeks  . Orders Placed This Encounter  Procedures  . CBC with Differential/Platelet    Standing Status:   Future    Number of Occurrences:   1    Standing Expiration Date:   10/27/2020  . CMP ([place] only)    Standing Status:   Future    Number of Occurrences:   1    Standing Expiration Date:   09/22/2020  . JAK2 (including V617F and Exon 12), MPL, and CALR-Next Generation Sequencing    Standing Status:   Future    Number of Occurrences:   1    Standing Expiration Date:   09/22/2020  . Sedimentation rate    Standing Status:   Future    Number of Occurrences:   1    Standing  Expiration Date:   09/22/2020  . Ferritin    Standing Status:   Future    Number of Occurrences:   1    Standing Expiration Date:   09/22/2020  . Iron and TIBC    Standing Status:    Future    Number of Occurrences:   1    Standing Expiration Date:   09/22/2020    All of the patients questions were answered with apparent satisfaction. The patient knows to call the clinic with any problems, questions or concerns.  I spent 35 mins counseling the patient face to face. The total time spent in the appointment was 45 minutes and more than 50% was on counseling and direct patient cares.    Sullivan Lone MD [place] AAHIVMS [facility] Hematology/Oncology Physician [facility]  (Office):       [phone] (Work cell):  [phone] (Fax):           [phone]  09/23/2019 12:23 PM  I, Yevette Edwards, am acting as a scribe for Dr. Sullivan Lone.   .I have reviewed the above documentation for accuracy and completeness, and I agree with the above. Brunetta Genera MD

## 2019-10-01 ENCOUNTER — Encounter: Payer: Self-pay | Admitting: Family Medicine

## 2019-10-02 ENCOUNTER — Other Ambulatory Visit: Payer: Self-pay | Admitting: Family Medicine

## 2019-10-02 MED ORDER — ACYCLOVIR 400 MG PO TABS: 400.0000 mg | ORAL_TABLET | Freq: Three times a day (TID) | ORAL | 1 refills | Status: DC

## 2019-10-04 MED FILL — LaMICtal 200 MG TABS: 200 | 30 days supply | Qty: 90 | Fill #2

## 2019-10-07 ENCOUNTER — Telehealth: Payer: Self-pay | Admitting: Hematology

## 2019-10-07 LAB — JAK2 (INCLUDING V617F AND EXON 12), MPL,& CALR-NEXT GEN SEQ

## 2019-10-07 NOTE — Telephone Encounter (Signed)
I LEFT  A message regarding phone visit

## 2019-10-08 ENCOUNTER — Encounter: Payer: Self-pay | Admitting: Neurology

## 2019-10-09 ENCOUNTER — Inpatient Hospital Stay: Payer: No Typology Code available for payment source | Attending: Hematology | Admitting: Hematology

## 2019-10-09 ENCOUNTER — Encounter: Payer: Self-pay | Admitting: Hematology

## 2019-10-09 DIAGNOSIS — D473 Essential (hemorrhagic) thrombocythemia: Secondary | ICD-10-CM | POA: Diagnosis not present

## 2019-10-09 DIAGNOSIS — D75839 Thrombocytosis, unspecified: Secondary | ICD-10-CM

## 2019-10-09 MED ORDER — POLYSACCHARIDE IRON COMPLEX 150 MG PO TABS: 150.0000 mg | ORAL_TABLET | Freq: Two times a day (BID) | ORAL | 3 refills | Status: DC

## 2019-10-09 NOTE — Progress Notes (Signed)
HEMATOLOGY/ONCOLOGY CONSULTATION NOTE  Date of Service: 10/09/2019  Patient Care Team: Dickie La, MD as PCP - General (Family Medicine)  CHIEF COMPLAINTS/PURPOSE OF CONSULTATION:   Thrombocytosis  HISTORY OF PRESENTING ILLNESS:   Anita [name] is a wonderful 39 y.o. female who has been referred to [place] by Dr Dorcas Mcmurray for evaluation and management of thrombocytosis. The pt reports that she is doing well overall.  The pt reports that she has a long history of anemia. Pt had twins via C-section in 07/2017. She received oral iron due to severe anemia directly after birth. She continued to bleed for about 2 months post-birth. Pt was told to take Ergocalciferol and has been less than diligent in taking it. She has not felt any differently over the last two years except for fatigue that can be attributed to having two very young children.   Pt has a history of seizures and no clear cause has been identified. The current theory is that due to a long Chickenpox infection when the pt was young, there could be some element of encephalopathy that could have been causing her seizures. Pt has been on Vimpat since 2013, and Lamictal for over 10 years. She has been seizure free for quite some time.  She has a history of endometriosis and used IVF to conceive her children. Pt does not take other medications regularly, nor herbal supplements. She denies any known family history of blood disorders. She has not had any kind of chronic joint pain, mouth pain, jaw pain or other chronic body pain. Pt is not using any birth control and has had a tubal ligation. Pt had no previous concerns about her thyroid function.  Pt was having centralized chest pains in July of 2019 and had a CT Chest on 03/16/2018. There was nothing of note found on the scan. Pt has continued to have intermittent chest pains but also has a significant amount of stress in her life.   Most recent lab results (08/21/2019) of CBC w/diff is as  follows: all values are WNL except for PLT at 505K. 08/21/2019 Vitamin D 25-hydroxy at 10.0  On review of systems, pt reports chest pain, fatigue, stress and denies joint pain, jaw pain, mouth pain, abdominal pain, leg swelling and any other symptoms.   On PMHx the pt reports Anemia, Seizures, IVF pregnancy, C-section, Tubal Ligation, Endometriosis, Chickenpox.  INTERVAL HISTORY:  I connected with  Anita [name] on 10/09/19 by telephone and verified that I am speaking with the correct person using two identifiers.   I discussed the limitations of evaluation and management by telemedicine. The patient expressed understanding and agreed to proceed.  Other persons participating in the visit and their role in the encounter:      -Yevette Edwards, Medical Scribe  Patient's location: Home Provider's location: [place] [facility] [place] is a wonderful 39 y.o. female who is here for evaluation and management of thrombocytosis. The patient's last visit with [place] was on 09/23/2019. The pt reports that she is doing well overall.  The pt reports that she has been doing well since our last visit. She has experienced heavy menstural bleeding for many years. Pt is currently scheduled to see her PCP in 3-4 months.   09/23/2019 Jak2, MPL, CALR Genetic Testing revealed "No mutations identified"  Lab results (09/23/19) of CBC w/diff and CMP is as follows: all values are WNL except for PLT at 489K, Total Protein at 8.2. 09/23/2019 Ferritin  at 7 09/23/2019 Sed Rate at 4 09/23/2019 Iron and TIBC is as follows: Iron at 38, TIBC at 359, Sat Ratios at 11, UIBC at 320  On review of systems, pt denies any other symptoms.   MEDICAL HISTORY:  Past Medical History:  Diagnosis Date  . Anemia   . Anxiety   . Heart murmur   . Newborn product of in vitro fertilization (IVF) pregnancy   . Seizures ([place])    Last seizure in 2013    SURGICAL HISTORY: Past Surgical History:  Procedure Laterality Date  .  arm surgery    . CESAREAN SECTION MULTI-GESTATIONAL WITH TUBAL N/A 08/25/2017   Procedure: CESAREAN SECTION MULTI-GESTATIONAL WITH TUBAL;  Surgeon: Louretta Shorten, MD;  Location: [place];  Service: Obstetrics;  Laterality: N/A;  Primary edc 09/08/17 need RNFA  . HYSTEROSCOPY WITH D & C N/A 10/09/2015   Procedure: DILATATION AND CURETTAGE /HYSTEROSCOPY ;  Surgeon: Louretta Shorten, MD;  Location: [facility];  Service: Gynecology;  Laterality: N/A;  . LAPAROSCOPY N/A 10/09/2015   Procedure: LAPAROSCOPY DIAGNOSTIC with CO 2 laser  WITH CHROMOPERTUBATION AND LASER OF ENDOMETRIOSIS WITH LYSIS OF ADHESIONS;  Surgeon: Louretta Shorten, MD;  Location: [facility];  Service: Gynecology;  Laterality: N/A;    SOCIAL HISTORY: Social History   Socioeconomic History  . Marital status: Married    Spouse name: Not on file  . Number of children: 0  . Years of education: college  . Highest education level: Not on file  Occupational History    Employer: [employer]  Tobacco Use  . Smoking status: Never Smoker  . Smokeless tobacco: Never Used  Substance and Sexual Activity  . Alcohol use: No  . Drug use: No  . Sexual activity: Yes    Birth control/protection: Surgical  Other Topics Concern  . Not on file  Social History Narrative   Patient lives at home with her boyfriend. Patient works full time at [employer].   Education- College   Right handed.   Caffeine- coffee four times a week.   Trying to get pregnant.  05-13-15   Social Determinants of Health   Financial Resource Strain:   . Difficulty of Paying Living Expenses: Not on file  Food Insecurity:   . Worried About Charity fundraiser in the Last Year: Not on file  . Ran Out of Food in the Last Year: Not on file  Transportation Needs:   . Lack of Transportation (Medical): Not on file  . Lack of Transportation (Non-Medical): Not on file  Physical Activity:   . Days of Exercise per Week: Not on file  . Minutes of Exercise per Session: Not on file    Stress:   . Feeling of Stress : Not on file  Social Connections:   . Frequency of Communication with Friends and Family: Not on file  . Frequency of Social Gatherings with Friends and Family: Not on file  . Attends Religious Services: Not on file  . Active Member of Clubs or Organizations: Not on file  . Attends Archivist Meetings: Not on file  . Marital Status: Not on file  Intimate Partner Violence:   . Fear of Current or Ex-Partner: Not on file  . Emotionally Abused: Not on file  . Physically Abused: Not on file  . Sexually Abused: Not on file    FAMILY HISTORY: Family History  Problem Relation Age of Onset  . Healthy Mother   . Healthy Father   .  Heart disease Maternal Grandmother   . Bone cancer Maternal Grandfather     ALLERGIES:  is allergic to dye fdc red [food color pink]; flagyl [metronidazole hcl]; morphine; and morphine and related.  MEDICATIONS:  Current Outpatient Medications  Medication Sig Dispense Refill  . acyclovir (ZOVIRAX) 400 MG tablet Take 1 tablet (400 mg total) by mouth 3 (three) times daily. 15 tablet 1  . lacosamide (VIMPAT) 200 MG TABS tablet Take 1 tablet (200 mg total) by mouth at bedtime. 90 tablet 1  . LAMICTAL 200 MG tablet TAKE 1.5 TABLETS BY MOUTH TWICE DAILY 90 tablet 11  . Polysaccharide Iron Complex 150 MG TABS Take 150 mg by mouth 2 (two) times daily. 60 tablet 3   No current facility-administered medications for this visit.    REVIEW OF SYSTEMS:   A 10+ POINT REVIEW OF SYSTEMS WAS OBTAINED including neurology, dermatology, psychiatry, cardiac, respiratory, lymph, extremities, GI, GU, Musculoskeletal, constitutional, breasts, reproductive, HEENT.  All pertinent positives are noted in the HPI.  All others are negative.   PHYSICAL EXAMINATION: ECOG PERFORMANCE STATUS: 0 - Asymptomatic  . There were no vitals filed for this visit. There were no vitals filed for this visit. .There is no height or weight on file to  calculate BMI.  Telehealth visit  LABORATORY DATA:  I have reviewed the data as listed  . CBC Latest Ref Rng & Units 09/23/2019 08/21/2019 07/31/2019  WBC 4.0 - 10.5 K/uL 10.0 7.2 6.8  Hemoglobin 12.0 - 15.0 g/dL 13.7 13.8 13.2  Hematocrit 36.0 - 46.0 % 42.0 43.0 39.1  Platelets 150 - 400 K/uL 489(H) 505(H) 508(H)    . CMP Latest Ref Rng & Units 09/23/2019 07/31/2019 03/16/2018  Glucose 70 - 99 mg/dL 87 86 90  BUN 6 - 20 mg/dL 11 9 8   Creatinine 0.44 - 1.00 mg/dL 0.77 0.66 0.72  Sodium 135 - 145 mmol/L 138 138 138  Potassium 3.5 - 5.1 mmol/L 3.7 4.2 3.9  Chloride 98 - 111 mmol/L 104 103 105  CO2 22 - 32 mmol/L 23 20 25   Calcium 8.9 - 10.3 mg/dL 9.6 9.8 9.1  Total Protein 6.5 - 8.1 g/dL 8.2(H) 7.3 -  Total Bilirubin 0.3 - 1.2 mg/dL 0.3 0.2 -  Alkaline Phos 38 - 126 U/L 82 100 -  AST 15 - 41 U/L 15 14 -  ALT 0 - 44 U/L 14 13 -    09/23/2019 Jak2, MPL, CALR Genetic Testing:    Component     Latest Ref Rng & Units 10/09/2015 08/24/2017 08/26/2017 08/27/2017  Platelets     150 - 450 x10E3/uL 376 299 248 261   Component     Latest Ref Rng & Units 09/14/2017 03/16/2018 07/31/2019 08/21/2019  Platelets     150 - 450 x10E3/uL 590 (H) 420 (H) 508 (H) 505 (H)    RADIOGRAPHIC STUDIES: I have personally reviewed the radiological images as listed and agreed with the findings in the report. No results found.  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  PLAN: -Discussed pt labwork, 09/23/19; PLT slowly decreasing, other blood counts and chemistries are nml -Discussed 09/23/2019 Ferritin is extremely low at 7 -Discussed 09/23/2019 Sed Rate is WNL at 4 -Discussed 09/23/2019 Iron and TIBC is as follows: Iron at 38, TIBC at 359, Sat Ratios at 11, UIBC at 320 -Discussed 09/23/2019 Jak2, MPL, CALR Genetic Testing which revealed "No mutations identified" -Advised pt that thrombocytosis is  not progressive, which suggests that this is not a clonal  process  -Advised pt that her other blood counts are nml, which does not suggest a primary bone marrow problem  -Thrombocytosis appears to be reactive process due to severe iron deficiency  -Advised pt that we would like to keep Ferritin >50 -No indication to treat at this time, pt will get f/u labs with PCP -Recommend pt begin taking one tablet of PO Iron Polysaccharide daily with food, can increase to two tablets of PO Iron with food in one week if no abdominal/bowel symptoms -Recommend pt continue to f/u with PCP for lab monitoring -Will see back as needed   The total time spent in the appt was 15 minutes and more than 50% was on counseling and direct patient cares.  All of the patient's questions were answered with apparent satisfaction. The patient knows to call the clinic with any problems, questions or concerns.    Sullivan Lone MD [place] AAHIVMS [facility] Hematology/Oncology Physician [facility]  (Office):       [phone] (Work cell):  [phone] (Fax):           [phone]  10/09/2019 5:11 PM  I, Yevette Edwards, am acting as a scribe for Dr. Sullivan Lone.   .I have reviewed the above documentation for accuracy and completeness, and I agree with the above. Brunetta Genera MD

## 2019-10-10 ENCOUNTER — Ambulatory Visit: Payer: No Typology Code available for payment source | Admitting: Hematology

## 2019-10-10 ENCOUNTER — Telehealth: Payer: Self-pay | Admitting: *Deleted

## 2019-10-10 NOTE — Telephone Encounter (Signed)
Contacted patient regarding Dr. Irene Limbo response to MyChart question r/t dye in iron prescribed: Per Dr. Irene Limbo, can purchase Novaferrum Iron Polysaccharide (OTC - can purchase from [facility]). Take 150 mg tablet daily for one week, if tolerated, can increase dose to 2 tablets a day. Patient verbalized understanding and states she will inform office if further questions.

## 2019-10-23 MED FILL — VIMPAT 200 MG TABLET: 200 | 90 days supply | Qty: 90 | Fill #1

## 2019-11-04 MED FILL — LaMICtal 200 MG TABS: 200 | 30 days supply | Qty: 90 | Fill #3

## 2019-11-14 ENCOUNTER — Encounter: Payer: Self-pay | Admitting: Neurology

## 2019-11-14 NOTE — Telephone Encounter (Signed)
I called pts [facility] pharmacy and spoke to [place], and she stated that pt picked up her medication on 11-04-19, then called the [facility] [phone] and spoke to [place].  They stated that lamictal 200mg  was covered medcation, no PA needed.  I relayed to pt and she stated that she received letter and will email to me to see what it says.

## 2019-12-02 MED FILL — LaMICtal 200 MG TABS: 200 | 30 days supply | Qty: 90 | Fill #4

## 2020-01-22 MED FILL — VIMPAT 200 MG TABLET: 200 | 90 days supply | Qty: 90 | Fill #0

## 2020-03-10 ENCOUNTER — Encounter: Payer: Self-pay | Admitting: Neurology

## 2020-03-10 ENCOUNTER — Telehealth: Payer: Self-pay | Admitting: Neurology

## 2020-03-10 DIAGNOSIS — G40309 Generalized idiopathic epilepsy and epileptic syndromes, not intractable, without status epilepticus: Secondary | ICD-10-CM

## 2020-03-10 NOTE — Telephone Encounter (Signed)
[place] Department but was on hold for over 10 minutes. Will call back after lunch.

## 2020-03-10 NOTE — Telephone Encounter (Signed)
In response to my chart messages, requesting to try generic lamictal, vimpat. I have no problem this, I see in chart requires brand name due to reported side effect. We can check levels before switching, then 1 month after on generic. I do not believe generic Vimpat is available. Dr. Krista Blue had mentioned in the future we may try to taper of seizure medications since she has done so well.

## 2020-03-11 ENCOUNTER — Other Ambulatory Visit: Payer: Self-pay | Admitting: Neurology

## 2020-03-11 MED ORDER — LAMOTRIGINE 200 MG PO TABS: ORAL_TABLET | ORAL | 11 refills | Status: DC

## 2020-03-11 MED FILL — lamoTRIgine 200 MG TABS: 200 | 30 days supply | Qty: 90 | Fill #0

## 2020-04-07 ENCOUNTER — Encounter: Payer: Self-pay | Admitting: Neurology

## 2020-04-08 ENCOUNTER — Other Ambulatory Visit (INDEPENDENT_AMBULATORY_CARE_PROVIDER_SITE_OTHER): Payer: Self-pay

## 2020-04-08 DIAGNOSIS — G40309 Generalized idiopathic epilepsy and epileptic syndromes, not intractable, without status epilepticus: Secondary | ICD-10-CM

## 2020-04-08 DIAGNOSIS — Z0289 Encounter for other administrative examinations: Secondary | ICD-10-CM

## 2020-04-10 LAB — LAMOTRIGINE LEVEL: Lamotrigine Lvl: 7.1 ug/mL (ref 2.0–20.0)

## 2020-04-10 MED FILL — lamoTRIgine 200 MG TABS: 200 | 30 days supply | Qty: 90 | Fill #1

## 2020-04-22 ENCOUNTER — Telehealth: Payer: Self-pay

## 2020-04-22 NOTE — Telephone Encounter (Signed)
-----   Message from Suzzanne Cloud, NP sent at 04/20/2020  9:24 AM EDT ----- Lamictal level is 7.1, this is within range. I assume this is on brand name Lamictal. We can recheck in 1 month after being on generic Lamictal. Have her let us know :)

## 2020-04-22 NOTE — Telephone Encounter (Signed)
Attempted to call pt, LVM for results per DPR. °Ask pt to call back for questions or concerns.  °

## 2020-04-23 ENCOUNTER — Encounter: Payer: Self-pay | Admitting: Neurology

## 2020-04-23 ENCOUNTER — Other Ambulatory Visit: Payer: Self-pay | Admitting: *Deleted

## 2020-04-23 MED ORDER — LACOSAMIDE 200 MG PO TABS: 200.0000 mg | ORAL_TABLET | Freq: Every day | ORAL | 1 refills | Status: DC

## 2020-04-23 MED FILL — VIMPAT 200 MG TABLET: 200 | 90 days supply | Qty: 90 | Fill #0

## 2020-05-11 MED FILL — lamoTRIgine 200 MG TABS: 200 | 30 days supply | Qty: 90 | Fill #2

## 2020-06-08 ENCOUNTER — Encounter: Payer: Self-pay | Admitting: Neurology

## 2020-06-08 ENCOUNTER — Other Ambulatory Visit: Payer: Self-pay | Admitting: Neurology

## 2020-06-08 DIAGNOSIS — G40309 Generalized idiopathic epilepsy and epileptic syndromes, not intractable, without status epilepticus: Secondary | ICD-10-CM

## 2020-06-08 NOTE — Progress Notes (Signed)
Order placed to check lamictal level now on generic.

## 2020-06-09 MED FILL — lamoTRIgine 200 MG TABS: 200 | 30 days supply | Qty: 90 | Fill #3

## 2020-06-25 ENCOUNTER — Encounter: Payer: Self-pay | Admitting: Neurology

## 2020-06-25 ENCOUNTER — Other Ambulatory Visit: Payer: Self-pay | Admitting: Neurology

## 2020-06-25 MED ORDER — LAMOTRIGINE 200 MG PO TABS: 300.0000 mg | ORAL_TABLET | Freq: Two times a day (BID) | ORAL | 0 refills | Status: DC

## 2020-06-26 MED FILL — lamoTRIgine 200 MG TABS: 200 | 14 days supply | Qty: 42 | Fill #0

## 2020-07-09 MED FILL — lamoTRIgine 200 MG TABS: 200 | 30 days supply | Qty: 90 | Fill #4

## 2020-07-22 MED FILL — VIMPAT 200 MG TABLET: 200 | 90 days supply | Qty: 90 | Fill #1

## 2020-07-29 ENCOUNTER — Other Ambulatory Visit: Payer: Self-pay | Admitting: Neurology

## 2020-07-29 ENCOUNTER — Encounter: Payer: Self-pay | Admitting: Neurology

## 2020-07-29 ENCOUNTER — Ambulatory Visit (INDEPENDENT_AMBULATORY_CARE_PROVIDER_SITE_OTHER): Payer: No Typology Code available for payment source | Admitting: Neurology

## 2020-07-29 VITALS — BP 111/71 | HR 74 | Ht 66.5 in | Wt 176.4 lb

## 2020-07-29 DIAGNOSIS — G40309 Generalized idiopathic epilepsy and epileptic syndromes, not intractable, without status epilepticus: Secondary | ICD-10-CM

## 2020-07-29 MED ORDER — LAMOTRIGINE 200 MG PO TABS: ORAL_TABLET | ORAL | 4 refills | Status: DC

## 2020-07-29 NOTE — Progress Notes (Signed)
PATIENT: Anita [name] DOB: 1981-08-15  REASON FOR VISIT: follow up HISTORY FROM: patient  HISTORY OF PRESENT ILLNESS: Today 07/29/20  Ms. Anita [name] is a 39 year old female with history of seizure disorder.  Her last seizure was in May 2016 after stopping clonazepam abruptly.  Since last seen, we have switched her to from brand to generic Lamictal 300 mg twice a day (since August/September time period).  She remains on Vimpat 200 mg at bedtime.  Has done well on the generic Lamictal.  No recurrent seizures.  Desires to continue on seizure medication, not try to taper.  Works full-time as a Psychologist, sport and exercise.  She has twins, almost 3.  Saw hematology due to elevated platelets, no significant abnormalities found.  Is taking multivitamin now.  Presents today for evaluation unaccompanied.  HISTORY  07/31/2019 SS: Ms. Anita [name] is a 39 year old female with history of seizure disorder.  Her last seizure occurred in May 2016 after stopping Klonopin abruptly. She is currently taking Vimpat 200 mg at bedtime, Lamictal brand name, 300 mg twice a day.  She has not had recurrent seizure.  She works full-time as a Psychologist, sport and exercise.  She has twins who are 2.  She is tolerating medications without side effect.  She requires brand name Vimpat and Lamictal.  She denies any new problems or concerns.  She presents for follow-up unaccompanied  REVIEW OF SYSTEMS: Out of a complete 14 system review of symptoms, the patient complains only of the following symptoms, and all other reviewed systems are negative.  N/A  ALLERGIES: Allergies  Allergen Reactions  . Dye Fdc Red [Food Color Pink] Nausea And Vomiting  . Flagyl [Metronidazole Hcl]     GI upset  . Morphine Nausea And Vomiting  . Morphine And Related Nausea And Vomiting    HOME MEDICATIONS: Outpatient Medications Prior to Visit  Medication Sig Dispense Refill  . lacosamide (VIMPAT) 200 MG TABS tablet Take 1 tablet (200 mg total) by mouth at bedtime. 90 tablet  1  . Multiple Vitamin (MULTIVITAMIN) tablet Take 1 tablet by mouth daily.    Marland Kitchen lamoTRIgine (LAMICTAL) 200 MG tablet TAKE 1.5 TABLETS BY MOUTH TWICE DAILY 90 tablet 11  . lamoTRIgine (LAMICTAL) 200 MG tablet Take 1.5 tablets (300 mg total) by mouth 2 (two) times daily. 42 tablet 0  . acyclovir (ZOVIRAX) 400 MG tablet Take 1 tablet (400 mg total) by mouth 3 (three) times daily. 15 tablet 1  . Polysaccharide Iron Complex 150 MG TABS Take 150 mg by mouth 2 (two) times daily. 60 tablet 3   No facility-administered medications prior to visit.    PAST MEDICAL HISTORY: Past Medical History:  Diagnosis Date  . Anemia   . Anxiety   . Heart murmur   . Newborn product of in vitro fertilization (IVF) pregnancy   . Seizures ([place])    Last seizure in 2013    PAST SURGICAL HISTORY: Past Surgical History:  Procedure Laterality Date  . arm surgery    . CESAREAN SECTION MULTI-GESTATIONAL WITH TUBAL N/A 08/25/2017   Procedure: CESAREAN SECTION MULTI-GESTATIONAL WITH TUBAL;  Surgeon: Louretta Shorten, MD;  Location: [facility];  Service: Obstetrics;  Laterality: N/A;  Primary edc 09/08/17 need RNFA  . HYSTEROSCOPY WITH D & C N/A 10/09/2015   Procedure: DILATATION AND CURETTAGE /HYSTEROSCOPY ;  Surgeon: Louretta Shorten, MD;  Location: [facility];  Service: Gynecology;  Laterality: N/A;  . LAPAROSCOPY N/A 10/09/2015   Procedure: LAPAROSCOPY DIAGNOSTIC with CO 2 laser  WITH  CHROMOPERTUBATION AND LASER OF ENDOMETRIOSIS WITH LYSIS OF ADHESIONS;  Surgeon: Louretta Shorten, MD;  Location: [facility];  Service: Gynecology;  Laterality: N/A;    FAMILY HISTORY: Family History  Problem Relation Age of Onset  . Healthy Mother   . Healthy Father   . Heart disease Maternal Grandmother   . Bone cancer Maternal Grandfather     SOCIAL HISTORY: Social History   Socioeconomic History  . Marital status: Married    Spouse name: Not on file  . Number of children: 0  . Years of education: college  . Highest education level: Not  on file  Occupational History    Employer: [employer]  Tobacco Use  . Smoking status: Never Smoker  . Smokeless tobacco: Never Used  Substance and Sexual Activity  . Alcohol use: No  . Drug use: No  . Sexual activity: Yes    Birth control/protection: Surgical  Other Topics Concern  . Not on file  Social History Narrative   Patient lives at home with her boyfriend. Patient works full time at [employer].   Education- College   Right handed.   Caffeine- coffee four times a week.   Trying to get pregnant.  05-13-15   Social Determinants of Health   Financial Resource Strain:   . Difficulty of Paying Living Expenses: Not on file  Food Insecurity:   . Worried About Charity fundraiser in the Last Year: Not on file  . Ran Out of Food in the Last Year: Not on file  Transportation Needs:   . Lack of Transportation (Medical): Not on file  . Lack of Transportation (Non-Medical): Not on file  Physical Activity:   . Days of Exercise per Week: Not on file  . Minutes of Exercise per Session: Not on file  Stress:   . Feeling of Stress : Not on file  Social Connections:   . Frequency of Communication with Friends and Family: Not on file  . Frequency of Social Gatherings with Friends and Family: Not on file  . Attends Religious Services: Not on file  . Active Member of Clubs or Organizations: Not on file  . Attends Archivist Meetings: Not on file  . Marital Status: Not on file  Intimate Partner Violence:   . Fear of Current or Ex-Partner: Not on file  . Emotionally Abused: Not on file  . Physically Abused: Not on file  . Sexually Abused: Not on file   PHYSICAL EXAM  Vitals:   07/29/20 0809  BP: 111/71  Pulse: 74  Weight: 176 lb 6.4 oz (80 kg)  Height: 5' 6.5" (1.689 m)   Body mass index is 28.05 kg/m.  Generalized: Well developed, in no acute distress   Neurological examination  Mentation: Alert oriented to time, place, history taking. Follows all commands  speech and language fluent Cranial nerve II-XII: Pupils were equal round reactive to light. Extraocular movements were full, visual field were full on confrontational test. Facial sensation and strength were normal.  Head turning and shoulder shrug  were normal and symmetric. Motor: The motor testing reveals 5 over 5 strength of all 4 extremities. Good symmetric motor tone is noted throughout.  Sensory: Sensory testing is intact to soft touch on all 4 extremities. No evidence of extinction is noted.  Coordination: Cerebellar testing reveals good finger-nose-finger and heel-to-shin bilaterally.  Gait and station: Gait is normal.  Reflexes: Deep tendon reflexes are symmetric and normal bilaterally.   DIAGNOSTIC DATA (LABS, IMAGING, TESTING) -  I reviewed patient records, labs, notes, testing and imaging myself where available.  Lab Results  Component Value Date   WBC 10.0 09/23/2019   HGB 13.7 09/23/2019   HCT 42.0 09/23/2019   MCV 86.2 09/23/2019   PLT 489 (H) 09/23/2019      Component Value Date/Time   NA 138 09/23/2019 1138   NA 138 07/31/2019 0808   K 3.7 09/23/2019 1138   CL 104 09/23/2019 1138   CO2 23 09/23/2019 1138   GLUCOSE 87 09/23/2019 1138   BUN 11 09/23/2019 1138   BUN 9 07/31/2019 0808   CREATININE 0.77 09/23/2019 1138   CREATININE 0.67 05/20/2014 1009   CALCIUM 9.6 09/23/2019 1138   PROT 8.2 (H) 09/23/2019 1138   PROT 7.3 07/31/2019 0808   ALBUMIN 4.3 09/23/2019 1138   ALBUMIN 4.4 07/31/2019 0808   AST 15 09/23/2019 1138   ALT 14 09/23/2019 1138   ALKPHOS 82 09/23/2019 1138   BILITOT 0.3 09/23/2019 1138   GFRNONAA >60 09/23/2019 1138   GFRAA >60 09/23/2019 1138   Lab Results  Component Value Date   CHOL 234 (H) 08/21/2019   HDL 78 08/21/2019   LDLCALC 145 (H) 08/21/2019   TRIG 64 08/21/2019   CHOLHDL 3.0 08/21/2019   No results found for: HGBA1C No results found for: VITAMINB12 Lab Results  Component Value Date   TSH 1.550 11/29/2012     ASSESSMENT AND PLAN 39 y.o. year old female  has a past medical history of Anemia, Anxiety, Heart murmur, Newborn product of in vitro fertilization (IVF) pregnancy, and Seizures ([place]). here with:  1.  Seizures -Most recent seizure in May 2016 -Switch to generic Lamictal since around August, doing well -Continue generic Lamictal 300 mg twice a day -Continue Vimpat 200 mg at bedtime -Check Lamictal level, on generic, was 7.1 on brand name -Seeing PCP end of the month for routine labs -Call for seizure activity, follow-up 1 year or sooner if needed  I spent 20 minutes of face-to-face and non-face-to-face time with patient.  This included previsit chart review, lab review, study review, order entry, electronic health record documentation, patient education.  Butler Denmark, AGNP-C, DNP 07/29/2020, 8:40 AM [facility] [address], [place], [place] [address] [phone]

## 2020-07-29 NOTE — Patient Instructions (Signed)
Continue current medications Check Lamictal level today Call for seizure activity  See you back in 1 year

## 2020-07-31 LAB — LAMOTRIGINE LEVEL: Lamotrigine Lvl: 9.5 ug/mL (ref 2.0–20.0)

## 2020-08-03 ENCOUNTER — Telehealth: Payer: Self-pay

## 2020-08-03 NOTE — Telephone Encounter (Signed)
-----   Message from Suzzanne Cloud, NP sent at 08/03/2020  8:48 AM EST ----- Lamictal level is 9.5 on generic. This is within range. She has good control of seizures.

## 2020-08-03 NOTE — Telephone Encounter (Signed)
Attempted to call pt, LVM for results per DPR. °Ask pt to call back for questions or concerns.  °

## 2020-08-05 MED FILL — lamoTRIgine 200 MG TABS: 200 | 90 days supply | Qty: 270 | Fill #0

## 2020-10-01 NOTE — Progress Notes (Signed)
I have reviewed and agreed above plan. 

## 2020-10-23 ENCOUNTER — Other Ambulatory Visit: Payer: Self-pay | Admitting: Neurology

## 2020-10-27 ENCOUNTER — Encounter: Payer: Self-pay | Admitting: Neurology

## 2020-10-27 ENCOUNTER — Other Ambulatory Visit: Payer: Self-pay | Admitting: Neurology

## 2020-10-27 MED ORDER — LACOSAMIDE 200 MG PO TABS: 200.0000 mg | ORAL_TABLET | Freq: Every day | ORAL | 1 refills | Status: DC

## 2020-10-27 MED FILL — VIMPAT 200 MG TABLET: 200 | 90 days supply | Qty: 90 | Fill #0

## 2021-01-19 ENCOUNTER — Encounter: Payer: Self-pay | Admitting: Family Medicine

## 2021-01-27 ENCOUNTER — Other Ambulatory Visit (HOSPITAL_COMMUNITY): Payer: Self-pay

## 2021-01-27 MED FILL — Lacosamide Tab 200 MG: ORAL | 90 days supply | Qty: 90 | Fill #0 | Status: AC

## 2021-01-28 ENCOUNTER — Other Ambulatory Visit (HOSPITAL_COMMUNITY): Payer: Self-pay

## 2021-01-28 MED ORDER — CARESTART COVID-19 HOME TEST VI KIT
PACK | 0 refills | Status: DC
  Filled 2021-01-28: qty 4, 4d supply, fill #0

## 2021-01-29 ENCOUNTER — Other Ambulatory Visit (HOSPITAL_COMMUNITY): Payer: Self-pay

## 2021-02-08 ENCOUNTER — Other Ambulatory Visit (HOSPITAL_COMMUNITY): Payer: Self-pay

## 2021-02-08 MED FILL — Lamotrigine Tab 200 MG: ORAL | 90 days supply | Qty: 270 | Fill #0 | Status: AC

## 2021-04-22 ENCOUNTER — Other Ambulatory Visit (HOSPITAL_COMMUNITY): Payer: Self-pay

## 2021-04-26 ENCOUNTER — Other Ambulatory Visit: Payer: Self-pay | Admitting: Neurology

## 2021-04-26 ENCOUNTER — Other Ambulatory Visit (HOSPITAL_COMMUNITY): Payer: Self-pay

## 2021-04-27 ENCOUNTER — Other Ambulatory Visit (HOSPITAL_COMMUNITY): Payer: Self-pay

## 2021-04-27 MED ORDER — LACOSAMIDE 200 MG PO TABS
200.0000 mg | ORAL_TABLET | Freq: Every day | ORAL | 1 refills | Status: DC
  Filled 2021-04-27: qty 90, 90d supply, fill #0
  Filled 2021-08-02: qty 90, 90d supply, fill #1

## 2021-04-29 ENCOUNTER — Encounter: Payer: Self-pay | Admitting: Neurology

## 2021-04-29 NOTE — Telephone Encounter (Signed)
The patient has not started taking the generic Vimpat yet. She picked it up from the pharmacy today. Her insurance is requiring generic. I spoke to Dr. Krista Blue about ordering labs. Per vo by Dr. Krista Blue, this is a newer generation medication. Labs are not necessary. If she has any adverse side effects or concerns after starting the generic prescription, then she should call our office to report these concerns. She verbalized understanding and was in agreement with this plan.

## 2021-04-30 ENCOUNTER — Other Ambulatory Visit (HOSPITAL_COMMUNITY): Payer: Self-pay

## 2021-04-30 MED ORDER — TRANEXAMIC ACID 650 MG PO TABS
1300.0000 mg | ORAL_TABLET | Freq: Three times a day (TID) | ORAL | 12 refills | Status: DC
  Filled 2021-04-30: qty 30, 5d supply, fill #0

## 2021-05-11 MED FILL — Lamotrigine Tab 200 MG: ORAL | 90 days supply | Qty: 270 | Fill #1 | Status: AC

## 2021-05-12 ENCOUNTER — Other Ambulatory Visit (HOSPITAL_COMMUNITY): Payer: Self-pay

## 2021-06-13 ENCOUNTER — Encounter: Payer: Self-pay | Admitting: Family Medicine

## 2021-06-15 ENCOUNTER — Other Ambulatory Visit (HOSPITAL_COMMUNITY): Payer: Self-pay

## 2021-06-15 ENCOUNTER — Other Ambulatory Visit: Payer: Self-pay

## 2021-06-15 ENCOUNTER — Ambulatory Visit (INDEPENDENT_AMBULATORY_CARE_PROVIDER_SITE_OTHER): Payer: No Typology Code available for payment source | Admitting: Family Medicine

## 2021-06-15 ENCOUNTER — Encounter: Payer: Self-pay | Admitting: Family Medicine

## 2021-06-15 VITALS — BP 139/85 | HR 98 | Ht 66.5 in | Wt 188.8 lb

## 2021-06-15 DIAGNOSIS — R3129 Other microscopic hematuria: Secondary | ICD-10-CM

## 2021-06-15 DIAGNOSIS — R319 Hematuria, unspecified: Secondary | ICD-10-CM | POA: Insufficient documentation

## 2021-06-15 DIAGNOSIS — R3915 Urgency of urination: Secondary | ICD-10-CM

## 2021-06-15 DIAGNOSIS — M545 Low back pain, unspecified: Secondary | ICD-10-CM

## 2021-06-15 LAB — POCT URINALYSIS DIP (MANUAL ENTRY)
Bilirubin, UA: NEGATIVE
Glucose, UA: NEGATIVE mg/dL
Ketones, POC UA: NEGATIVE mg/dL
Leukocytes, UA: NEGATIVE
Nitrite, UA: NEGATIVE
Protein Ur, POC: NEGATIVE mg/dL
Spec Grav, UA: 1.02 (ref 1.010–1.025)
Urobilinogen, UA: 0.2 E.U./dL
pH, UA: 7 (ref 5.0–8.0)

## 2021-06-15 LAB — POCT UA - MICROSCOPIC ONLY

## 2021-06-15 MED ORDER — TRAMADOL HCL 50 MG PO TABS
50.0000 mg | ORAL_TABLET | Freq: Three times a day (TID) | ORAL | 0 refills | Status: AC | PRN
  Filled 2021-06-15: qty 5, 2d supply, fill #0

## 2021-06-15 MED ORDER — TAMSULOSIN HCL 0.4 MG PO CAPS
0.4000 mg | ORAL_CAPSULE | Freq: Every day | ORAL | 0 refills | Status: DC
Start: 2021-06-15 — End: 2021-08-04
  Filled 2021-06-15: qty 30, 30d supply, fill #0

## 2021-06-15 NOTE — Patient Instructions (Signed)
It was wonderful to meet you today. Thank you for allowing me to be a part of your care. Below is a short summary of what we discussed at your visit today:  Likely kidney stones Your pain and presentation sounds like you have a kidney stone or couple of stones. - I will send you for ultrasound to evaluate for any stones in your urinary tract.  The nurse will let you know when and where your appointment is. - Please use the urine filter to try and catch your stone.  We will send it to the lab for analysis. - Take Flomax once daily for 2 weeks - Take tramadol as needed for severe pain    Please bring all of your medications to every appointment!  If you have any questions or concerns, please do not hesitate to contact us via phone or MyChart message.   Ezequiel Essex, MD

## 2021-06-15 NOTE — Progress Notes (Signed)
dip 

## 2021-06-15 NOTE — Progress Notes (Signed)
    SUBJECTIVE:   CHIEF COMPLAINT / HPI:   Possible kidney stone or UTI - UA today demonstrates trace intact blood, otherwise unremarkable - back spasms bilaterally Friday through the weekend, had bad pain associated nausea - Sun, Mon subjective urinary urgency but very little volume - Mon started to wipe blood, hurt to urinate - today has moderate to severe lower back pain - has been pushing fluids - not currently menstruating - has been taking 1500 mg tylenol and 600 mg ibuprofen with mild relief - heating pad works somewhat - drinks water, juices, coffee  - in the last 2-3 weeks has been 3 cups coffee daily - in a normal day, 5 bottles of water daily  PERTINENT  PMH / PSH: polycystic ovaries, generalized convulsive epilepsy, thrombocytosis, hx C-section  OBJECTIVE:   BP 139/85   Pulse 98   Ht 5' 6.5" (1.689 m)   Wt 188 lb 12.8 oz (85.6 kg)   LMP 05/28/2021   SpO2 100%   BMI 30.02 kg/m   PHQ-9:  Depression screen [facility] 2/9 06/15/2021 08/21/2019 03/16/2015  Decreased Interest 0 0 0  Down, Depressed, Hopeless 0 0 0  PHQ - 2 Score 0 0 0  Altered sleeping 0 - -  Tired, decreased energy 0 - -  Change in appetite 0 - -  Feeling bad or failure about yourself  0 - -  Trouble concentrating 0 - -  Moving slowly or fidgety/restless 0 - -  Suicidal thoughts 0 - -  PHQ-9 Score 0 - -     GAD-7: No flowsheet data found.   Physical Exam General: Awake, alert, oriented, no acute distress Respiratory: Unlabored respirations, speaking in full sentences, no respiratory distress GU/GI: No CVA tenderness Extremities: Moving all extremities spontaneously Neuro: Cranial nerves II through X grossly intact Psych: Normal insight and judgement   ASSESSMENT/PLAN:   Hematuria UA today with trace intact blood, no signs of infection.  Leading differential diagnosis includes renal stones.  Will send for ultrasound to confirm diagnosis.  Start Flomax 0.4 mg daily x2 weeks.  Tramadol 50 mg x  #5 tabs for breakthrough pain not responsive to her conservative management with Tylenol, ibuprofen, and heating pad.     Ezequiel Essex, MD [place]

## 2021-06-15 NOTE — Assessment & Plan Note (Signed)
UA today with trace intact blood, no signs of infection.  Leading differential diagnosis includes renal stones.  Will send for ultrasound to confirm diagnosis.  Start Flomax 0.4 mg daily x2 weeks.  Tramadol 50 mg x #5 tabs for breakthrough pain not responsive to her conservative management with Tylenol, ibuprofen, and heating pad.

## 2021-06-21 ENCOUNTER — Other Ambulatory Visit: Payer: Self-pay

## 2021-06-21 ENCOUNTER — Ambulatory Visit (HOSPITAL_COMMUNITY)
Admission: RE | Admit: 2021-06-21 | Discharge: 2021-06-21 | Disposition: A | Discharge status: Home / Self Care | Payer: No Typology Code available for payment source | Source: Ambulatory Visit | Attending: Family Medicine | Admitting: Family Medicine

## 2021-06-21 DIAGNOSIS — M545 Low back pain, unspecified: Secondary | ICD-10-CM | POA: Insufficient documentation

## 2021-06-21 DIAGNOSIS — R3915 Urgency of urination: Secondary | ICD-10-CM | POA: Insufficient documentation

## 2021-06-21 DIAGNOSIS — R3129 Other microscopic hematuria: Secondary | ICD-10-CM | POA: Diagnosis present

## 2021-06-22 ENCOUNTER — Encounter: Payer: Self-pay | Admitting: Family Medicine

## 2021-06-24 ENCOUNTER — Other Ambulatory Visit: Payer: Self-pay

## 2021-06-24 ENCOUNTER — Ambulatory Visit (INDEPENDENT_AMBULATORY_CARE_PROVIDER_SITE_OTHER): Payer: No Typology Code available for payment source | Admitting: Family Medicine

## 2021-06-24 ENCOUNTER — Other Ambulatory Visit (HOSPITAL_COMMUNITY): Payer: Self-pay

## 2021-06-24 VITALS — BP 122/60 | HR 104 | Wt 185.2 lb

## 2021-06-24 DIAGNOSIS — R319 Hematuria, unspecified: Secondary | ICD-10-CM | POA: Diagnosis not present

## 2021-06-24 DIAGNOSIS — N3001 Acute cystitis with hematuria: Secondary | ICD-10-CM

## 2021-06-24 LAB — POCT URINALYSIS DIP (MANUAL ENTRY)
Bilirubin, UA: NEGATIVE
Blood, UA: NEGATIVE
Glucose, UA: NEGATIVE mg/dL
Ketones, POC UA: NEGATIVE mg/dL
Leukocytes, UA: NEGATIVE
Nitrite, UA: NEGATIVE
Protein Ur, POC: NEGATIVE mg/dL
Spec Grav, UA: <=1.005 — AB (ref 1.010–1.025)
Urobilinogen, UA: 0.2 E.U./dL
pH, UA: 6 (ref 5.0–8.0)

## 2021-06-24 MED ORDER — CEPHALEXIN 500 MG PO CAPS
500.0000 mg | ORAL_CAPSULE | Freq: Three times a day (TID) | ORAL | 0 refills | Status: AC
  Filled 2021-06-24: qty 21, 7d supply, fill #0

## 2021-06-24 NOTE — Progress Notes (Signed)
    SUBJECTIVE:   CHIEF COMPLAINT / HPI:   UTI symptoms - Has continued to have UTI symptoms including dysuria, occasional pyuria, and supra pubic pain -This morning, her urine appeared cloudy and nearly purulent - She works at a pediatrician's office, ran her urine with a UA and showed leuks and nitrites (she had her supervisor confirm results) - She is concerned because the symptoms keep worsening - At last office visit, was sent for renal ultrasound of concern for stones, ultrasound was negative  PERTINENT  PMH / PSH:  Past Medical History:  Diagnosis Date   Anemia    Anxiety    Heart murmur    Newborn product of in vitro fertilization (IVF) pregnancy    Seizures ([place])    Last seizure in 2013      OBJECTIVE:   BP 122/60   Pulse (!) 104   Wt 185 lb 3.2 oz (84 kg)   LMP 05/28/2021   SpO2 98%   BMI 29.44 kg/m   PHQ-9:  Depression screen [facility] 2/9 06/24/2021 06/15/2021 08/21/2019  Decreased Interest 0 0 0  Down, Depressed, Hopeless 0 0 0  PHQ - 2 Score 0 0 0  Altered sleeping 0 0 -  Tired, decreased energy 0 0 -  Change in appetite 0 0 -  Feeling bad or failure about yourself  0 0 -  Trouble concentrating 0 0 -  Moving slowly or fidgety/restless 0 0 -  Suicidal thoughts 0 0 -  PHQ-9 Score 0 0 -     GAD-7: No flowsheet data found.   Physical Exam General: Awake, alert, oriented, no acute distress Respiratory: Unlabored respirations, speaking in full sentences, no respiratory distress Extremities: Moving all extremities spontaneously Neuro: Cranial nerves II through X grossly intact Psych: Normal insight and judgement Abdomen: Suprapubic tenderness, tenderness to palpation over left lower quadrant, mild CVA tenderness  ASSESSMENT/PLAN:   Hematuria Renal ultrasound negative for stone.  Flomax provided no benefit.  Symptoms have worsened and become more consistent with UTI.  UA today unremarkable.  This clinically fits with UTI, so will prescribe Keflex 500 mg  twice daily x7 days.  Return precautions reviewed.  We will send urine culture.     Ezequiel Essex, MD [place]

## 2021-06-24 NOTE — Patient Instructions (Signed)
It was wonderful to see you today. Thank you for allowing me to be a part of your care. Below is a short summary of what we discussed at your visit today:  Likely UTI - Take the keflex TID x 7 days - Call us if symptoms fail to improve - We sent your urine for culture. If the results are normal, I will send you a letter or MyChart message. If the results are abnormal, I will give you a call.     Please bring all of your medications to every appointment!  If you have any questions or concerns, please do not hesitate to contact us via phone or MyChart message.   Ezequiel Essex, MD

## 2021-06-24 NOTE — Progress Notes (Deleted)
    SUBJECTIVE:   CHIEF COMPLAINT / HPI:   ***  Hyst 11/9 goes to pre-op next Tues   All started 10/16  C-section, twins, IVF now 40 yo old (will be 3 in Dec)  PERTINENT  PMH / PSH: ***  OBJECTIVE:   BP 122/60   Pulse (!) 104   Wt 185 lb 3.2 oz (84 kg)   LMP 05/28/2021   SpO2 98%   BMI 29.44 kg/m   ***  ASSESSMENT/PLAN:   No problem-specific Assessment & Plan notes found for this encounter.     Ezequiel Essex, MD [place]

## 2021-06-24 NOTE — Progress Notes (Signed)
ur

## 2021-06-29 ENCOUNTER — Encounter: Payer: Self-pay | Admitting: Family Medicine

## 2021-06-29 LAB — URINE CULTURE

## 2021-07-03 NOTE — Assessment & Plan Note (Signed)
Renal ultrasound negative for stone.  Flomax provided no benefit.  Symptoms have worsened and become more consistent with UTI.  UA today unremarkable.  This clinically fits with UTI, so will prescribe Keflex 500 mg twice daily x7 days.  Return precautions reviewed.  We will send urine culture.

## 2021-07-07 ENCOUNTER — Other Ambulatory Visit: Payer: Self-pay | Admitting: Obstetrics and Gynecology

## 2021-07-07 DIAGNOSIS — Z90710 Acquired absence of both cervix and uterus: Secondary | ICD-10-CM

## 2021-07-07 HISTORY — DX: Acquired absence of both cervix and uterus: Z90.710

## 2021-07-08 ENCOUNTER — Other Ambulatory Visit (HOSPITAL_COMMUNITY): Payer: Self-pay

## 2021-07-08 MED ORDER — OXYCODONE-ACETAMINOPHEN 5-325 MG PO TABS
1.0000 | ORAL_TABLET | Freq: Four times a day (QID) | ORAL | 0 refills | Status: DC | PRN
  Filled 2021-07-08: qty 30, 8d supply, fill #0

## 2021-07-08 MED ORDER — IBUPROFEN 800 MG PO TABS
800.0000 mg | ORAL_TABLET | Freq: Three times a day (TID) | ORAL | 0 refills | Status: DC
  Filled 2021-07-08: qty 30, 10d supply, fill #0

## 2021-07-08 MED ORDER — METHOCARBAMOL 500 MG PO TABS
1000.0000 mg | ORAL_TABLET | Freq: Four times a day (QID) | ORAL | 0 refills | Status: DC | PRN
  Filled 2021-07-08: qty 20, 3d supply, fill #0

## 2021-07-19 ENCOUNTER — Other Ambulatory Visit (HOSPITAL_COMMUNITY): Payer: Self-pay

## 2021-07-19 MED ORDER — OXYCODONE-ACETAMINOPHEN 5-325 MG PO TABS
1.0000 | ORAL_TABLET | Freq: Four times a day (QID) | ORAL | 0 refills | Status: DC | PRN
  Filled 2021-07-19: qty 20, 5d supply, fill #0

## 2021-07-19 MED ORDER — AMOXICILLIN-POT CLAVULANATE 875-125 MG PO TABS
1.0000 | ORAL_TABLET | Freq: Two times a day (BID) | ORAL | 0 refills | Status: DC
  Filled 2021-07-19: qty 20, 10d supply, fill #0

## 2021-08-02 ENCOUNTER — Other Ambulatory Visit (HOSPITAL_COMMUNITY): Payer: Self-pay

## 2021-08-04 ENCOUNTER — Other Ambulatory Visit: Payer: Self-pay

## 2021-08-04 ENCOUNTER — Ambulatory Visit (INDEPENDENT_AMBULATORY_CARE_PROVIDER_SITE_OTHER): Payer: No Typology Code available for payment source | Admitting: Neurology

## 2021-08-04 ENCOUNTER — Other Ambulatory Visit (HOSPITAL_COMMUNITY): Payer: Self-pay

## 2021-08-04 ENCOUNTER — Other Ambulatory Visit: Payer: Self-pay | Admitting: Neurology

## 2021-08-04 ENCOUNTER — Encounter: Payer: Self-pay | Admitting: Neurology

## 2021-08-04 VITALS — BP 103/69 | HR 77 | Ht 66.5 in | Wt 188.0 lb

## 2021-08-04 DIAGNOSIS — G40309 Generalized idiopathic epilepsy and epileptic syndromes, not intractable, without status epilepticus: Secondary | ICD-10-CM | POA: Diagnosis not present

## 2021-08-04 MED ORDER — LAMOTRIGINE 200 MG PO TABS
300.0000 mg | ORAL_TABLET | Freq: Two times a day (BID) | ORAL | 4 refills | Status: DC
  Filled 2021-08-04: qty 270, 90d supply, fill #0
  Filled 2021-11-05: qty 67, 22d supply, fill #1
  Filled 2021-11-05: qty 243, 81d supply, fill #1
  Filled 2021-11-05: qty 90, 30d supply, fill #1
  Filled 2021-11-05: qty 203, 68d supply, fill #1

## 2021-08-04 NOTE — Progress Notes (Signed)
PATIENT: Anita [name] DOB: 10/06/1980  REASON FOR VISIT: follow up HISTORY FROM: patient PRIMARY NEUROLOGIST: Dr. Krista Blue   HISTORY OF PRESENT ILLNESS: Today 08/04/21  Anita [name] here today for follow-up. Recovering from hysterectomy, going back to work Dec 21. Doing well on generic Vimpat, Lamictal. Not interested in reducing dose of seizure medications. Last seizure was in 2016, feels since then, and before memory isn't as sharp, forgets names, feels fatigued. Has family history of memory disease.MRI of the brain with and without contrast was normal in August 2017. Has twins, who are 4.   Update 07/29/2020 SS: Ms. Anita [name] is a 40 year old female with history of seizure disorder.  Her last seizure was in May 2016 after stopping clonazepam abruptly.  Since last seen, we have switched her to from brand to generic Lamictal 300 mg twice a day (since August/September time period).  She remains on Vimpat 200 mg at bedtime.  Has done well on the generic Lamictal.  No recurrent seizures.  Desires to continue on seizure medication, not try to taper.  Works full-time as a Psychologist, sport and exercise.  She has twins, almost 3.  Saw hematology due to elevated platelets, no significant abnormalities found.  Is taking multivitamin now.  Presents today for evaluation unaccompanied.  HISTORY  07/31/2019 SS: Ms. Anita [name] is a 41 year old female with history of seizure disorder.  Her last seizure occurred in May 2016 after stopping Klonopin abruptly. She is currently taking Vimpat 200 mg at bedtime, Lamictal brand name, 300 mg twice a day.  She has not had recurrent seizure.  She works full-time as a Psychologist, sport and exercise.  She has twins who are 2.  She is tolerating medications without side effect.  She requires brand name Vimpat and Lamictal.  She denies any new problems or concerns.  She presents for follow-up unaccompanied  REVIEW OF SYSTEMS: Out of a complete 14 system review of symptoms, the patient complains only of the  following symptoms, and all other reviewed systems are negative.  N/A  ALLERGIES: Allergies  Allergen Reactions   Dye Fdc Red [Food Color Pink] Nausea And Vomiting   Flagyl [Metronidazole Hcl]     GI upset   Morphine Nausea And Vomiting   Morphine And Related Nausea And Vomiting    HOME MEDICATIONS: Outpatient Medications Prior to Visit  Medication Sig Dispense Refill   lacosamide (VIMPAT) 200 MG TABS tablet Take 1 tablet (200 mg total) by mouth at bedtime. 90 tablet 1   lamoTRIgine (LAMICTAL) 200 MG tablet TAKE 1.5 TABLETS BY MOUTH TWICE DAILY 270 tablet 4   amoxicillin-clavulanate (AUGMENTIN) 875-125 MG tablet Take 1 tablet by mouth every 12 (twelve) hours for 10 days. 20 tablet 0   COVID-19 At Home Antigen Test (CARESTART COVID-19 HOME TEST) KIT Use as directed within package instructions. 4 each 0   ibuprofen (ADVIL) 800 MG tablet Take 1 tablet (800 mg total) by mouth 3 (three) times daily as needed 30 tablet 0   methocarbamol (ROBAXIN) 500 MG tablet Take 2 tablets (1,000 mg total) by mouth 4 (four) times daily as needed. 20 tablet 0   Multiple Vitamin (MULTIVITAMIN) tablet Take 1 tablet by mouth daily.     oxyCODONE-acetaminophen (PERCOCET) 5-325 MG tablet Take 1 tablet by mouth every 6 (six) hours as needed. 20 tablet 0   tamsulosin (FLOMAX) 0.4 MG CAPS capsule Take 1 capsule (0.4 mg total) by mouth daily. For two weeks. 30 capsule 0   tranexamic acid (LYSTEDA) 650 MG TABS tablet Take  2 tablets (1,300 mg total) by mouth 3 (three) times daily as needed. May take up to 5 days in a row max. 30 tablet 12   No facility-administered medications prior to visit.    PAST MEDICAL HISTORY: Past Medical History:  Diagnosis Date   Anemia    Anxiety    H/O: hysterectomy 07/07/2021   Heart murmur    Newborn product of in vitro fertilization (IVF) pregnancy    Seizures ([place])    Last seizure in 2013    PAST SURGICAL HISTORY: Past Surgical History:  Procedure Laterality Date   arm  surgery     CESAREAN SECTION MULTI-GESTATIONAL WITH TUBAL N/A 08/25/2017   Procedure: CESAREAN SECTION MULTI-GESTATIONAL WITH TUBAL;  Surgeon: Louretta Shorten, MD;  Location: [facility];  Service: Obstetrics;  Laterality: N/A;  Primary edc 09/08/17 need RNFA   HYSTEROSCOPY WITH D & C N/A 10/09/2015   Procedure: DILATATION AND CURETTAGE /HYSTEROSCOPY ;  Surgeon: Louretta Shorten, MD;  Location: [facility];  Service: Gynecology;  Laterality: N/A;   LAPAROSCOPY N/A 10/09/2015   Procedure: LAPAROSCOPY DIAGNOSTIC with CO 2 laser  WITH CHROMOPERTUBATION AND LASER OF ENDOMETRIOSIS WITH LYSIS OF ADHESIONS;  Surgeon: Louretta Shorten, MD;  Location: [facility];  Service: Gynecology;  Laterality: N/A;    FAMILY HISTORY: Family History  Problem Relation Age of Onset   Healthy Mother    Healthy Father    Heart disease Maternal Grandmother    Bone cancer Maternal Grandfather     SOCIAL HISTORY: Social History   Socioeconomic History   Marital status: Married    Spouse name: Not on file   Number of children: 0   Years of education: college   Highest education level: Not on file  Occupational History    Employer: [employer]  Tobacco Use   Smoking status: Never   Smokeless tobacco: Never  Substance and Sexual Activity   Alcohol use: No   Drug use: No   Sexual activity: Yes    Birth control/protection: Surgical  Other Topics Concern   Not on file  Social History Narrative   Patient lives at home with her boyfriend. Patient works full time at [employer].   Education- College   Right handed.   Caffeine- coffee four times a week.   Trying to get pregnant.  05-13-15   Social Determinants of Health   Financial Resource Strain: Not on file  Food Insecurity: Not on file  Transportation Needs: Not on file  Physical Activity: Not on file  Stress: Not on file  Social Connections: Not on file  Intimate Partner Violence: Not on file   PHYSICAL EXAM  Vitals:   08/04/21 0817  BP: 103/69  Pulse: 77   Weight: 188 lb (85.3 kg)  Height: 5' 6.5" (1.689 m)    Body mass index is 29.89 kg/m.  Generalized: Well developed, in no acute distress   Neurological examination  Mentation: Alert oriented to time, place, history taking. Follows all commands speech and language fluent Cranial nerve II-XII: Pupils were equal round reactive to light. Extraocular movements were full, visual field were full on confrontational test. Facial sensation and strength were normal.  Head turning and shoulder shrug  were normal and symmetric. Motor: The motor testing reveals 5 over 5 strength of all 4 extremities. Good symmetric motor tone is noted throughout.  Sensory: Sensory testing is intact to soft touch on all 4 extremities. No evidence of extinction is noted.  Coordination: Cerebellar testing reveals good finger-nose-finger and  heel-to-shin bilaterally.  Gait and station: Gait is normal.  Reflexes: Deep tendon reflexes are symmetric and normal bilaterally.   DIAGNOSTIC DATA (LABS, IMAGING, TESTING) - I reviewed patient records, labs, notes, testing and imaging myself where available.  Lab Results  Component Value Date   WBC 10.0 09/23/2019   HGB 13.7 09/23/2019   HCT 42.0 09/23/2019   MCV 86.2 09/23/2019   PLT 489 (H) 09/23/2019      Component Value Date/Time   NA 138 09/23/2019 1138   NA 138 07/31/2019 0808   K 3.7 09/23/2019 1138   CL 104 09/23/2019 1138   CO2 23 09/23/2019 1138   GLUCOSE 87 09/23/2019 1138   BUN 11 09/23/2019 1138   BUN 9 07/31/2019 0808   CREATININE 0.77 09/23/2019 1138   CREATININE 0.67 05/20/2014 1009   CALCIUM 9.6 09/23/2019 1138   PROT 8.2 (H) 09/23/2019 1138   PROT 7.3 07/31/2019 0808   ALBUMIN 4.3 09/23/2019 1138   ALBUMIN 4.4 07/31/2019 0808   AST 15 09/23/2019 1138   ALT 14 09/23/2019 1138   ALKPHOS 82 09/23/2019 1138   BILITOT 0.3 09/23/2019 1138   GFRNONAA >60 09/23/2019 1138   GFRAA >60 09/23/2019 1138   Lab Results  Component Value Date   CHOL 234  (H) 08/21/2019   HDL 78 08/21/2019   LDLCALC 145 (H) 08/21/2019   TRIG 64 08/21/2019   CHOLHDL 3.0 08/21/2019   No results found for: HGBA1C No results found for: VITAMINB12 Lab Results  Component Value Date   TSH 1.550 11/29/2012    ASSESSMENT AND PLAN 40 y.o. year old female  has a past medical history of Anemia, Anxiety, H/O: hysterectomy (07/07/2021), Heart murmur, Newborn product of in vitro fertilization (IVF) pregnancy, and Seizures ([place]). here with:  1.  Seizures  -Most recent seizure in May 2016 -Continue Lamictal 300 mg twice daily, continue Vimpat 200 mg at bedtime -Check labs today, CBC, CMP, Lamictal level; will add on Vitamin D, TSH, B12 due to fatigue, long term memory concerns since seizure dx -MRI of the brain with and without contrast in August 2017 was normal -Is working on establishing with a new PCP -Call for seizure activity, otherwise follow-up 1 year or sooner if needed  Kaile, Bixler, DNP 08/04/2021, 8:55 AM [facility] [address], [place], [place] [address] [phone]

## 2021-08-04 NOTE — Patient Instructions (Signed)
Check labs today  Continue current medications  Call for any seizures Please establish with your PCP  See you back in 1 year

## 2021-08-07 LAB — CBC WITH DIFFERENTIAL/PLATELET
Basophils Absolute: 0 10*3/uL (ref 0.0–0.2)
Basos: 1 %
EOS (ABSOLUTE): 0.1 10*3/uL (ref 0.0–0.4)
Eos: 2 %
Hematocrit: 38.8 % (ref 34.0–46.6)
Hemoglobin: 13.3 g/dL (ref 11.1–15.9)
Immature Grans (Abs): 0 10*3/uL (ref 0.0–0.1)
Immature Granulocytes: 0 %
Lymphocytes Absolute: 2.4 10*3/uL (ref 0.7–3.1)
Lymphs: 35 %
MCH: 30.2 pg (ref 26.6–33.0)
MCHC: 34.3 g/dL (ref 31.5–35.7)
MCV: 88 fL (ref 79–97)
Monocytes Absolute: 0.6 10*3/uL (ref 0.1–0.9)
Monocytes: 9 %
Neutrophils Absolute: 3.6 10*3/uL (ref 1.4–7.0)
Neutrophils: 53 %
Platelets: 436 10*3/uL (ref 150–450)
RBC: 4.4 x10E6/uL (ref 3.77–5.28)
RDW: 11.8 % (ref 11.7–15.4)
WBC: 6.7 10*3/uL (ref 3.4–10.8)

## 2021-08-07 LAB — COMPREHENSIVE METABOLIC PANEL
ALT: 10 IU/L (ref 0–32)
AST: 16 IU/L (ref 0–40)
Albumin/Globulin Ratio: 1.6 (ref 1.2–2.2)
Albumin: 4.6 g/dL (ref 3.8–4.8)
Alkaline Phosphatase: 87 IU/L (ref 44–121)
BUN/Creatinine Ratio: 13 (ref 9–23)
BUN: 8 mg/dL (ref 6–24)
Bilirubin Total: 0.3 mg/dL (ref 0.0–1.2)
CO2: 24 mmol/L (ref 20–29)
Calcium: 9.5 mg/dL (ref 8.7–10.2)
Chloride: 101 mmol/L (ref 96–106)
Creatinine, Ser: 0.64 mg/dL (ref 0.57–1.00)
Globulin, Total: 2.8 g/dL (ref 1.5–4.5)
Glucose: 79 mg/dL (ref 70–99)
Potassium: 4.3 mmol/L (ref 3.5–5.2)
Sodium: 140 mmol/L (ref 134–144)
Total Protein: 7.4 g/dL (ref 6.0–8.5)
eGFR: 114 mL/min/{1.73_m2} (ref >59)

## 2021-08-07 LAB — LAMOTRIGINE LEVEL: Lamotrigine Lvl: 11 ug/mL (ref 2.0–20.0)

## 2021-08-07 LAB — VITAMIN D 25 HYDROXY (VIT D DEFICIENCY, FRACTURES): Vit D, 25-Hydroxy: 17.8 ng/mL — ABNORMAL LOW (ref 30.0–100.0)

## 2021-08-07 LAB — TSH: TSH: 2.46 u[IU]/mL (ref 0.450–4.500)

## 2021-08-07 LAB — VITAMIN B12: Vitamin B-12: 417 pg/mL (ref 232–1245)

## 2021-08-16 ENCOUNTER — Other Ambulatory Visit (HOSPITAL_COMMUNITY): Payer: Self-pay

## 2021-08-16 MED ORDER — CARESTART COVID-19 HOME TEST VI KIT
PACK | 0 refills | Status: DC
  Filled 2021-08-16: qty 4, 4d supply, fill #0

## 2021-09-03 ENCOUNTER — Other Ambulatory Visit (HOSPITAL_COMMUNITY): Payer: Self-pay

## 2021-10-04 ENCOUNTER — Other Ambulatory Visit: Payer: Self-pay

## 2021-10-04 ENCOUNTER — Emergency Department (HOSPITAL_COMMUNITY): Payer: BC Managed Care – PPO

## 2021-10-04 ENCOUNTER — Encounter (HOSPITAL_COMMUNITY): Payer: Self-pay

## 2021-10-04 ENCOUNTER — Observation Stay (HOSPITAL_COMMUNITY): Payer: BC Managed Care – PPO

## 2021-10-04 ENCOUNTER — Observation Stay (HOSPITAL_BASED_OUTPATIENT_CLINIC_OR_DEPARTMENT_OTHER): Payer: BC Managed Care – PPO

## 2021-10-04 ENCOUNTER — Inpatient Hospital Stay (HOSPITAL_COMMUNITY)
Admission: EM | Admit: 2021-10-04 | Discharge: 2021-10-05 | DRG: 069 | Disposition: A | Discharge status: Home / Self Care | Payer: BC Managed Care – PPO | Attending: Family Medicine | Admitting: Family Medicine

## 2021-10-04 DIAGNOSIS — Z79899 Other long term (current) drug therapy: Secondary | ICD-10-CM

## 2021-10-04 DIAGNOSIS — G459 Transient cerebral ischemic attack, unspecified: Secondary | ICD-10-CM | POA: Diagnosis present

## 2021-10-04 DIAGNOSIS — Z20822 Contact with and (suspected) exposure to covid-19: Secondary | ICD-10-CM | POA: Diagnosis present

## 2021-10-04 DIAGNOSIS — Z8673 Personal history of transient ischemic attack (TIA), and cerebral infarction without residual deficits: Secondary | ICD-10-CM | POA: Diagnosis not present

## 2021-10-04 DIAGNOSIS — G8194 Hemiplegia, unspecified affecting left nondominant side: Secondary | ICD-10-CM | POA: Diagnosis present

## 2021-10-04 DIAGNOSIS — M5417 Radiculopathy, lumbosacral region: Secondary | ICD-10-CM | POA: Diagnosis not present

## 2021-10-04 DIAGNOSIS — H9042 Sensorineural hearing loss, unilateral, left ear, with unrestricted hearing on the contralateral side: Secondary | ICD-10-CM | POA: Diagnosis present

## 2021-10-04 DIAGNOSIS — Z9102 Food additives allergy status: Secondary | ICD-10-CM

## 2021-10-04 DIAGNOSIS — R200 Anesthesia of skin: Secondary | ICD-10-CM | POA: Diagnosis not present

## 2021-10-04 DIAGNOSIS — F419 Anxiety disorder, unspecified: Secondary | ICD-10-CM | POA: Diagnosis present

## 2021-10-04 DIAGNOSIS — Z881 Allergy status to other antibiotic agents status: Secondary | ICD-10-CM | POA: Diagnosis not present

## 2021-10-04 DIAGNOSIS — Z8249 Family history of ischemic heart disease and other diseases of the circulatory system: Secondary | ICD-10-CM | POA: Diagnosis not present

## 2021-10-04 DIAGNOSIS — H532 Diplopia: Secondary | ICD-10-CM

## 2021-10-04 DIAGNOSIS — Z90710 Acquired absence of both cervix and uterus: Secondary | ICD-10-CM

## 2021-10-04 DIAGNOSIS — I10 Essential (primary) hypertension: Secondary | ICD-10-CM | POA: Diagnosis present

## 2021-10-04 DIAGNOSIS — G40409 Other generalized epilepsy and epileptic syndromes, not intractable, without status epilepticus: Secondary | ICD-10-CM | POA: Diagnosis present

## 2021-10-04 DIAGNOSIS — R531 Weakness: Secondary | ICD-10-CM | POA: Diagnosis not present

## 2021-10-04 DIAGNOSIS — D509 Iron deficiency anemia, unspecified: Secondary | ICD-10-CM | POA: Diagnosis present

## 2021-10-04 DIAGNOSIS — R011 Cardiac murmur, unspecified: Secondary | ICD-10-CM | POA: Diagnosis present

## 2021-10-04 DIAGNOSIS — D75839 Thrombocytosis, unspecified: Secondary | ICD-10-CM | POA: Diagnosis present

## 2021-10-04 DIAGNOSIS — H9313 Tinnitus, bilateral: Secondary | ICD-10-CM | POA: Diagnosis present

## 2021-10-04 DIAGNOSIS — E785 Hyperlipidemia, unspecified: Secondary | ICD-10-CM | POA: Diagnosis present

## 2021-10-04 DIAGNOSIS — R29704 NIHSS score 4: Secondary | ICD-10-CM | POA: Diagnosis present

## 2021-10-04 DIAGNOSIS — R29703 NIHSS score 3: Secondary | ICD-10-CM | POA: Diagnosis not present

## 2021-10-04 LAB — ECHOCARDIOGRAM COMPLETE
Area-P 1/2: 3.68 cm2
Calc EF: 66.9 %
Height: 67 in
S' Lateral: 2.91 cm
Single Plane A2C EF: 66.4 %
Single Plane A4C EF: 67.1 %
Weight: 3008 oz

## 2021-10-04 LAB — RAPID URINE DRUG SCREEN, HOSP PERFORMED
Amphetamines: NOT DETECTED
Barbiturates: NOT DETECTED
Benzodiazepines: NOT DETECTED
Cocaine: NOT DETECTED
Opiates: NOT DETECTED
Tetrahydrocannabinol: NOT DETECTED

## 2021-10-04 LAB — URINALYSIS, ROUTINE W REFLEX MICROSCOPIC
Bilirubin Urine: NEGATIVE
Glucose, UA: NEGATIVE mg/dL
Hgb urine dipstick: NEGATIVE
Ketones, ur: NEGATIVE mg/dL
Leukocytes,Ua: NEGATIVE
Nitrite: NEGATIVE
Protein, ur: NEGATIVE mg/dL
Specific Gravity, Urine: <1.005 — ABNORMAL LOW (ref 1.005–1.030)
pH: 7 (ref 5.0–8.0)

## 2021-10-04 LAB — COMPREHENSIVE METABOLIC PANEL
ALT: 15 U/L (ref 0–44)
AST: 18 U/L (ref 15–41)
Albumin: 4.4 g/dL (ref 3.5–5.0)
Alkaline Phosphatase: 56 U/L (ref 38–126)
Anion gap: 10 (ref 5–15)
BUN: 9 mg/dL (ref 6–20)
CO2: 25 mmol/L (ref 22–32)
Calcium: 9.6 mg/dL (ref 8.9–10.3)
Chloride: 102 mmol/L (ref 98–111)
Creatinine, Ser: 0.78 mg/dL (ref 0.44–1.00)
GFR, Estimated: >60 mL/min (ref >60)
Glucose, Bld: 96 mg/dL (ref 70–99)
Potassium: 3.6 mmol/L (ref 3.5–5.1)
Sodium: 137 mmol/L (ref 135–145)
Total Bilirubin: 0.4 mg/dL (ref 0.3–1.2)
Total Protein: 7.6 g/dL (ref 6.5–8.1)

## 2021-10-04 LAB — RESP PANEL BY RT-PCR (FLU A&B, COVID) ARPGX2
Influenza A by PCR: NEGATIVE
Influenza B by PCR: NEGATIVE
SARS Coronavirus 2 by RT PCR: NEGATIVE

## 2021-10-04 LAB — CBC
HCT: 44.5 % (ref 36.0–46.0)
Hemoglobin: 14.7 g/dL (ref 12.0–15.0)
MCH: 30.1 pg (ref 26.0–34.0)
MCHC: 33 g/dL (ref 30.0–36.0)
MCV: 91.2 fL (ref 80.0–100.0)
Platelets: 428 10*3/uL — ABNORMAL HIGH (ref 150–400)
RBC: 4.88 MIL/uL (ref 3.87–5.11)
RDW: 12.1 % (ref 11.5–15.5)
WBC: 5.9 10*3/uL (ref 4.0–10.5)
nRBC: 0 % (ref 0.0–0.2)

## 2021-10-04 LAB — HEMOGLOBIN A1C
Hgb A1c MFr Bld: 5 % (ref 4.8–5.6)
Mean Plasma Glucose: 96.8 mg/dL

## 2021-10-04 LAB — LIPID PANEL
Cholesterol: 204 mg/dL — ABNORMAL HIGH (ref 0–200)
HDL: 71 mg/dL (ref >40)
LDL Cholesterol: 127 mg/dL — ABNORMAL HIGH (ref 0–99)
Total CHOL/HDL Ratio: 2.9 RATIO
Triglycerides: 32 mg/dL (ref <150)
VLDL: 6 mg/dL (ref 0–40)

## 2021-10-04 LAB — I-STAT CHEM 8, ED
BUN: 11 mg/dL (ref 6–20)
Calcium, Ion: 1.05 mmol/L — ABNORMAL LOW (ref 1.15–1.40)
Chloride: 105 mmol/L (ref 98–111)
Creatinine, Ser: 0.6 mg/dL (ref 0.44–1.00)
Glucose, Bld: 91 mg/dL (ref 70–99)
HCT: 45 % (ref 36.0–46.0)
Hemoglobin: 15.3 g/dL — ABNORMAL HIGH (ref 12.0–15.0)
Potassium: 3.9 mmol/L (ref 3.5–5.1)
Sodium: 139 mmol/L (ref 135–145)
TCO2: 25 mmol/L (ref 22–32)

## 2021-10-04 LAB — I-STAT BETA HCG BLOOD, ED (MC, WL, AP ONLY): I-stat hCG, quantitative: <5 m[IU]/mL (ref <5)

## 2021-10-04 LAB — TROPONIN I (HIGH SENSITIVITY)
Troponin I (High Sensitivity): <2 ng/L (ref <18)
Troponin I (High Sensitivity): <2 ng/L (ref <18)

## 2021-10-04 LAB — DIFFERENTIAL
Abs Immature Granulocytes: 0.02 10*3/uL (ref 0.00–0.07)
Basophils Absolute: 0 10*3/uL (ref 0.0–0.1)
Basophils Relative: 1 %
Eosinophils Absolute: 0.1 10*3/uL (ref 0.0–0.5)
Eosinophils Relative: 1 %
Immature Granulocytes: 0 %
Lymphocytes Relative: 39 %
Lymphs Abs: 2.3 10*3/uL (ref 0.7–4.0)
Monocytes Absolute: 0.6 10*3/uL (ref 0.1–1.0)
Monocytes Relative: 11 %
Neutro Abs: 2.8 10*3/uL (ref 1.7–7.7)
Neutrophils Relative %: 48 %

## 2021-10-04 LAB — APTT: aPTT: 30 seconds (ref 24–36)

## 2021-10-04 LAB — ETHANOL: Alcohol, Ethyl (B): <10 mg/dL (ref <10)

## 2021-10-04 LAB — CBG MONITORING, ED: Glucose-Capillary: 114 mg/dL — ABNORMAL HIGH (ref 70–99)

## 2021-10-04 LAB — TSH: TSH: 1.227 u[IU]/mL (ref 0.350–4.500)

## 2021-10-04 LAB — FERRITIN: Ferritin: 22 ng/mL (ref 11–307)

## 2021-10-04 LAB — PROTIME-INR
INR: 0.9 (ref 0.8–1.2)
Prothrombin Time: 12.5 seconds (ref 11.4–15.2)

## 2021-10-04 LAB — HIV ANTIBODY (ROUTINE TESTING W REFLEX): HIV Screen 4th Generation wRfx: NONREACTIVE

## 2021-10-04 MED ORDER — ASPIRIN 325 MG PO TABS
325.0000 mg | ORAL_TABLET | Freq: Once | ORAL | Status: AC
  Administered 2021-10-04: 325 mg via ORAL
  Filled 2021-10-04: qty 1

## 2021-10-04 MED ORDER — ACETAMINOPHEN 325 MG PO TABS
650.0000 mg | ORAL_TABLET | Freq: Four times a day (QID) | ORAL | Status: DC | PRN
  Administered 2021-10-04 – 2021-10-05 (×2): 650 mg via ORAL
  Filled 2021-10-04 (×2): qty 2

## 2021-10-04 MED ORDER — ASPIRIN EC 81 MG PO TBEC
81.0000 mg | DELAYED_RELEASE_TABLET | Freq: Every day | ORAL | Status: DC
  Administered 2021-10-05: 81 mg via ORAL
  Filled 2021-10-04: qty 1

## 2021-10-04 MED ORDER — LAMOTRIGINE 150 MG PO TABS
300.0000 mg | ORAL_TABLET | Freq: Two times a day (BID) | ORAL | Status: DC
  Administered 2021-10-04 – 2021-10-05 (×2): 300 mg via ORAL
  Filled 2021-10-04 (×4): qty 2

## 2021-10-04 MED ORDER — LACOSAMIDE 200 MG PO TABS
200.0000 mg | ORAL_TABLET | Freq: Every day | ORAL | Status: DC
  Administered 2021-10-04: 200 mg via ORAL
  Filled 2021-10-04: qty 1

## 2021-10-04 NOTE — Code Documentation (Signed)
Stroke Response Nurse Documentation Code Documentation  Anita [name] is a 41 y.o. female arriving to [facility] ED via Private Vehicle on 10/04/21 with past medical hx of seizures, anxiety, heart murmurs . On No antithrombotic. Code stroke was activated by EDP PA.   Patient from work this am where she was LKW at 1200 on Saturday 2/4 and now complaining of trouble holding things in her left hand with the numbness. Patient states she developed numbness in her left arm and shoulder pain on Saturday around 1200. She denies chest pain. She says on Sunday morning she noticed double vision and some nausea when she stands. By Monday morning at [place] she was dropping things from the numbness in her hand at work and decided to go to the ED.   Code stroke activated by EDP once patient in the room and "new weakness" had been described by the patient. Labs drawn and patient cleared for CT by Dr. Ashok Cordia. Patient to CT with team. Stroke team arrived to CT when patient was going into the room. NIHSS 3, see documentation for details and code stroke times. Patient with left leg weakness, left limb ataxia, and left decreased sensation on exam. The following imaging was completed:  CT head. Patient is not a candidate for IV Thrombolytic due to outside window. Patient is not a candidate for IR due to no LVO/outside window.   Care/Plan: Q2x12 then Q4 neuro checks/vitals, EEG.   Bedside handoff with ED RN [place].    Oswell Say, Rande Brunt  Stroke Response RN

## 2021-10-04 NOTE — Consult Note (Addendum)
Neurology Consult  Anita [name] MR# [id number] 10/04/2021   CC: Stroke Patient seen and assessed at bedside: 9:25 AM History is obtained from: patient and chart.  HPI: Anita [name] is a 41 y.o. female PMHx of seizures (last seizure 2013), anemia, anxiety, heart murmur presents to [facility] due to double vision, tingling in left arm, and left sided weakness. Patient reports that at 12 PM Saturday, she began having double vision. These symptoms persisted and worsened when getting up suddenly. On Sunday, patient reports to having LLE weakness Sunday and left shoulder pain. Patient at 7:45 AM today had dropped something she was holding with her left hand which prompted her to go the ED. Patient denies any inciting factors and reports that her symptoms are still present today but have not worsened. Patient does report she has had history of seizures but has not had a seizure in years. Patient reports compliance with Vimpat 200 mg qhs and Lamictal 300 mg bid for seizures.  LKW: Saturday 12 PM tNK given: No, outside of window IR Thrombectomy No, not indicated. no LVO Modified Rankin Scale: 1-No significant post stroke disability and can perform usual duties with stroke symptoms NIHSS: 3  ROS: A complete 12 point ROS was performed and is negative except as noted in the HPI.   Past Medical History:  Diagnosis Date   Anemia    Anxiety    H/O: hysterectomy 07/07/2021   Heart murmur    Newborn product of in vitro fertilization (IVF) pregnancy    Seizures ([place])    Last seizure in 2013     Family History  Problem Relation Age of Onset   Healthy Mother    Healthy Father    Heart disease Maternal Grandmother    Bone cancer Maternal Grandfather     Social History:  reports that she has never smoked. She has never used smokeless tobacco. She reports that she does not drink alcohol and does not use drugs.   Prior to Admission medications   Medication Sig Start Date End Date Taking?  Authorizing Provider  lacosamide (VIMPAT) 200 MG TABS tablet Take 1 tablet (200 mg total) by mouth at bedtime. 04/27/21   Marcial Pacas, MD  lamoTRIgine (LAMICTAL) 200 MG tablet Take 1.5 tablets (300 mg total) by mouth 2 (two) times daily. 08/04/21   Suzzanne Cloud, NP    Exam: Current vital signs: BP (!) 134/93    Pulse 96    Temp 97.8 F (36.6 C) (Oral)    Resp 13    Ht 5\' 7"  (1.702 m)    Wt 85.3 kg    SpO2 100%    BMI 29.44 kg/m   Physical Exam  Constitutional: Appears well-developed and well-nourished.  Psych: Affect appropriate to situation Eyes: No scleral injection HENT: No OP obstruction. Head: Normocephalic.  Cardiovascular: Normal rate and regular rhythm.  Respiratory: Effort normal, symmetric excursions bilaterally, no audible wheezing. GI: Soft.  No distension. There is no tenderness.  Skin: WDI  Neuro: Mental Status: Patient is awake, alert, oriented to person, place, month, year, and situation. Patient is able to give a clear and coherent history. Speech  fluent, intact comprehension and repetition. No signs of aphasia or neglect. Visual Fields are full. Pupils are equal, round, and reactive to light. EOMI without ptosis or diploplia.  Facial sensation is symmetric to temperature Facial movement is symmetric.  Hearing is intact to voice. Uvula midline and palate elevates symmetrically. Shoulder shrug is symmetric. Tongue is midline without  atrophy or fasciculations.  Tone is normal. Bulk is normal. Minor drift in LLE but otherwise 5/5 strength in all other extremities. Sensation: numbness in LLE and LUE. Deep Tendon Reflexes: 2+ and symmetric in the biceps and patellae. Toes are downgoing bilaterally. FNF are intact bilaterally and LLE limb ataxia Gait - Deferred  Interval: Initial (02/06 0847) Level of Consciousness (1a.)   : Alert, keenly responsive (02/06 0930) LOC Questions (1b. )   +: Answers both questions correctly (02/06 0930) LOC Commands (1c. )   + :  Performs both tasks correctly (02/06 0930) Best Gaze (2. )  +: Normal (02/06 0930) Visual (3. )  +: No visual loss (02/06 0930) Facial Palsy (4. )    : Normal symmetrical movements (02/06 0930) Motor Arm, Left (5a. )   +: No drift (02/06 0930) Motor Arm, Right (5b. )   +: No drift (02/06 0930) Motor Leg, Left (6a. )   +: Drift (02/06 0930) Motor Leg, Right (6b. )   +: No drift (02/06 0930) Limb Ataxia (7. ): Present in one limb (02/06 0930) Sensory (8. )   +: Mild-to-moderate sensory loss, patient feels pinprick is less sharp or is dull on the affected side, or there is a loss of superficial pain with pinprick, but patient is aware of being touched (02/06 0930) Best Language (9. )   +: No aphasia (02/06 0930) Dysarthria (10. ): Normal (02/06 0930) Extinction/Inattention (11.)   +: No Abnormality (02/06 0930) Modified SS Total  +: 2 (02/06 0930) Complete NIHSS TOTAL: 3 (02/06 0930)   I have reviewed labs in epic and the pertinent results are: CBC Latest Ref Rng & Units 10/04/2021 10/04/2021 08/04/2021  WBC 4.0 - 10.5 K/uL - 5.9 6.7  Hemoglobin 12.0 - 15.0 g/dL 15.3(H) 14.7 13.3  Hematocrit 36.0 - 46.0 % 45.0 44.5 38.8  Platelets 150 - 400 K/uL - 428(H) 436   CMP Latest Ref Rng & Units 10/04/2021 10/04/2021 08/04/2021  Glucose 70 - 99 mg/dL 91 96 79  BUN 6 - 20 mg/dL 11 9 8   Creatinine 0.44 - 1.00 mg/dL 0.60 0.78 0.64  Sodium 135 - 145 mmol/L 139 137 140  Potassium 3.5 - 5.1 mmol/L 3.9 3.6 4.3  Chloride 98 - 111 mmol/L 105 102 101  CO2 22 - 32 mmol/L - 25 24  Calcium 8.9 - 10.3 mg/dL - 9.6 9.5  Total Protein 6.5 - 8.1 g/dL - 7.6 7.4  Total Bilirubin 0.3 - 1.2 mg/dL - 0.4 0.3  Alkaline Phos 38 - 126 U/L - 56 87  AST 15 - 41 U/L - 18 16  ALT 0 - 44 U/L - 15 10   CT Head: Negative. I have reviewed the images obtained personally. NCT head showed No evidence of acute abnormality. ASPECTS is 10.  Assessment: Anita [name] is a 41 y.o. female PMHx seizures (last seizure 2013), anemia,  anxiety, heart murmur presents to [facility] due to double vision, tingling in left arm, and left sided weakness (limited by subjective pain on left side). Neuro consulted and patient to be admitted for stroke work up. DDX:  Stroke vs seizure  Plan: -Frequent neuro/checks - MRI/[place] without contrast. -carotid doppler. - Recommend TTE. - Recommend labs: HbA1c, lipid panel, TSH. - Recommend Statin if LDL > 70 - Aspirin 325 now consider DAPT if MRI is positive for CVA - Permissive hypertension first 24 h < 220/110.  - Telemetry monitoring for arrhythmia. - Recommend bedside Swallow screen. - Recommend Stroke  education. - Recommend PT/OT/SLP consult and vestibular PT. - Routine EEG to eval for any epileptogenic discharges. -Con't home seizure meds.    Stroke team to follow   Electronically signed by:  France Ravens, MD PGY1 Resident 10/04/2021, 10:00 AM  If 7pm- 7am, please page neurology on call as listed in [place].  ATTENDING ATTESTATION:  Code stroke activated in the ER. LKN Saturday am. H/o seizures, compliant on medications. Has left side pain and monocular diplopia on exam. Admit for stroke workup. Stat aspirin 325 now, further recommendations after MRI is completed. Stroke team to follow.  Plan discussed with ED PA and radiology team.  Dr. Reeves Forth evaluated pt independently, reviewed imaging, chart, labs. Discussed and formulated plan with the APP. Please see APP note above for details.   Total 80 minutes spent on counseling patient and coordinating care, writing notes and reviewing chart.    Rynn Markiewicz,MD     Yaquelin Langelier,MD

## 2021-10-04 NOTE — ED Notes (Addendum)
Pt transported to vascular for Echo.

## 2021-10-04 NOTE — ED Notes (Signed)
Pt used bedside commode with no assistance.

## 2021-10-04 NOTE — Progress Notes (Signed)
Family Medicine Teaching Service Daily Progress Note Intern Pager: [phone]  Patient name: Anita [name] Medical record number: [id number] Date of birth: 19-Jan-1981 Age: 41 y.o. Gender: female  Primary Care Provider: Dickie La, MD Consultants: Neurology Code Status: Full  Pt Overview and Major Events to Date:  2/6 admitted code stroke called-MRI brain negative, CT Head negative  Assessment and Plan:  Anita [name] is a 41 year old female presented with left-sided arm pain and weakness.  Past medical history significant for seizures and thrombocytosis  Left-sided weakness/pain-now peripheral neuropathy with continued diplopia Acute stroke was ruled out given negative brain MRI and CT head. This morning patient is complaining of tingling in her palms and soles of feet.  Says that when she sits upright the pressure goes down her thighs but denies any shocklike sensation radiating down the legs.  This can affect the lumbar region of back is painful.  Also says she has diplopia describing it as side-by-side images.  Denies any family history of autoimmune diseases but could consider MS.  EOMI. Can consider side effect of medications she takes at home like the antiepileptics. (Lacosamide can have side effects ataxia, dizziness, headache, diplopia) but has been on it since 2013. Neck ROM was uncomfortable on examination when rotating side by side. Carotid ultrasound was negative for thrombus or plaque.  Echocardiogram showed EF of 60 to 65% and grade 1 diastolic dysfunction.  Seizure is a possibility given her past medical history as well and EEG showed 1 isolated epileptiform discharge suggestive of an underlying foci of epileptogenic potential. TSH 1.227. Lipid panel with total 204, LDL 127. A1c was 5.0 -Neurology following, appreciate recommendations-would like assessment for ongoing diplopia -ASA 81 mg daily -consider statin -PT/OT -SLP -Continue home seizure  medications -orthostatics -Neuro checks  H/o Seizures Home meds of vimpat and lamictal. Levels are in process -lamictal -vimpat -seizure precautions  Iron Deficiency Anemia Last ferritin of 7. Follows with hematology for thrombocytosis. Repeat ferritin was 22. -Ferrous sulfate every other at d/c    FEN/GI: Regular PPx: SCDs Dispo:Home pending clinical improvement  Subjective:  Continues to have diplopia. Says she has tingling feeling in hands and feet bilaterally when she sits up. Has been able to walk.  Objective: Temp:  [97.5 F (36.4 C)-98.8 F (37.1 C)] 98.3 F (36.8 C) (02/07 1140) Pulse Rate:  [76-86] 86 (02/07 1140) Resp:  [10-18] 18 (02/07 1140) BP: (97-118)/(65-87) 110/78 (02/07 1140) SpO2:  [97 %-99 %] 99 % (02/07 1140) Physical Exam: General: NAD, laying in bed  Cardiovascular: RRR no m/r/g Respiratory: CTAB no w/r/c Abdomen: Nontender to palpation, soft Extremities: Sensation intact, strength 5/5 bilaterally.   Laboratory: Recent Labs  Lab 10/04/21 0920 10/04/21 0925 10/05/21 0254  WBC 5.9  --  7.5  HGB 14.7 15.3* 13.8  HCT 44.5 45.0 42.4  PLT 428*  --  431*   Recent Labs  Lab 10/04/21 0920 10/04/21 0925 10/05/21 0254  NA 137 139 136  K 3.6 3.9 4.1  CL 102 105 104  CO2 25  --  26  BUN 9 11 12   CREATININE 0.78 0.60 0.67  CALCIUM 9.6  --  9.0  PROT 7.6  --  7.3  BILITOT 0.4  --  0.1*  ALKPHOS 56  --  46  ALT 15  --  12  AST 18  --  13*  GLUCOSE 96 91 90     Imaging/Diagnostic Tests: MR ANGIO HEAD WO CONTRAST  Result Date: 10/04/2021 CLINICAL DATA:  Neuro  deficit, acute, stroke suspected EXAM: MRI HEAD WITHOUT CONTRAST MRA HEAD WITHOUT CONTRAST TECHNIQUE: Multiplanar, multi-echo pulse sequences of the brain and surrounding structures were acquired without intravenous contrast. Angiographic images of the Circle of Willis were acquired using MRA technique without intravenous contrast. COMPARISON:  No pertinent prior exam. FINDINGS: MRI  HEAD Brain: There is no acute infarction or intracranial hemorrhage. There is no intracranial mass, mass effect, or edema. There is no hydrocephalus or extra-axial fluid collection. Ventricles and sulci are normal in size and configuration. Vascular: Major vessel flow voids at the skull base are preserved. Skull and upper cervical spine: Normal marrow signal is preserved. Sinuses/Orbits: Paranasal sinuses are aerated. Orbits are unremarkable. Other: Sella is unremarkable.  Mastoid air cells are clear. MRA HEAD Intracranial internal carotid arteries are patent. Middle and anterior cerebral arteries are patent. Intracranial vertebral arteries, basilar artery, posterior cerebral arteries are patent. There is no significant stenosis or aneurysm. IMPRESSION: No evidence of recent infarction, hemorrhage, or mass. Unremarkable vascular imaging. Electronically Signed   By: Macy Mis M.D.   On: 10/04/2021 13:58   MR BRAIN WO CONTRAST  Result Date: 10/04/2021 CLINICAL DATA:  Neuro deficit, acute, stroke suspected EXAM: MRI HEAD WITHOUT CONTRAST MRA HEAD WITHOUT CONTRAST TECHNIQUE: Multiplanar, multi-echo pulse sequences of the brain and surrounding structures were acquired without intravenous contrast. Angiographic images of the Circle of Willis were acquired using MRA technique without intravenous contrast. COMPARISON:  No pertinent prior exam. FINDINGS: MRI HEAD Brain: There is no acute infarction or intracranial hemorrhage. There is no intracranial mass, mass effect, or edema. There is no hydrocephalus or extra-axial fluid collection. Ventricles and sulci are normal in size and configuration. Vascular: Major vessel flow voids at the skull base are preserved. Skull and upper cervical spine: Normal marrow signal is preserved. Sinuses/Orbits: Paranasal sinuses are aerated. Orbits are unremarkable. Other: Sella is unremarkable.  Mastoid air cells are clear. MRA HEAD Intracranial internal carotid arteries are patent.  Middle and anterior cerebral arteries are patent. Intracranial vertebral arteries, basilar artery, posterior cerebral arteries are patent. There is no significant stenosis or aneurysm. IMPRESSION: No evidence of recent infarction, hemorrhage, or mass. Unremarkable vascular imaging. Electronically Signed   By: Macy Mis M.D.   On: 10/04/2021 13:58     Gerrit Heck, MD 10/05/2021, 12:34 PM PGY-1, [place] Intern pager: [phone], text pages welcome

## 2021-10-04 NOTE — Progress Notes (Signed)
PT Cancellation Note  Patient Details Name: Anita [name] MRN: [id number] DOB: August 09, 1981   Cancelled Treatment:    Reason Eval/Treat Not Completed: Other (comment) Order for tomorrow entered after initial evaluation order. Will hold until tomorrow.   Lou Miner, DPT  Acute Rehabilitation Services  Pager: [phone] Office: [phone]    Rudean Hitt 10/04/2021, 3:32 PM

## 2021-10-04 NOTE — Progress Notes (Signed)
VASCULAR LAB    Carotid duplex has been performed.  See CV proc for preliminary results.   Boots Mcglown, RVT 10/04/2021, 11:58 AM Carotid duplex

## 2021-10-04 NOTE — ED Triage Notes (Signed)
Pt arrived POV from home c/o double vision and pins and needles in her arm. Pt states she is starting to drop things because of the numbness. Pt's LKW was Saturday 12pm.

## 2021-10-04 NOTE — Procedures (Signed)
This is a routine inpatient EEG performed on: October 04, 2021  Clinical indication: History of seizures  History: The patient is a 41 year old woman with a history of seizure disorder anxiety and spell of left-sided weakness and tingling.  Introduction: This is a routine inpatient EEG performed using the standard 10-20 system of electrode placement with 21 channels of EEG and a single channel of EKG monitoring.  Approximately 22:34 minutes of EEG captured.  Photic stimulation and hyperventilation were not performed.   Description of the record:  The awake background is continuous and symmetric characterized by  a well formed 10-11 hertz posterior dominant rhythm.  Stage II sleep contained bisynchronous sleep spindles.  One isolated spike and wave discharges seen maximal over the bilateral frontocentral head regions maximal at  F3/C3 and F4/C4 embedded in a K complex. No seizures or evolving ictal patterns seen.  The single-channel EKG shows a heart rate in the 80 BPM range   Impression: Abnormal awake and asleep routine EEG due to one isolated epileptiform discharge embedded in a K complex during stage II sleep maximal over the bilateral frontocentral head regions.  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 No seizures or evolving ictal patterns seen

## 2021-10-04 NOTE — ED Provider Notes (Signed)
[facility] EMERGENCY DEPARTMENT Provider Note   CSN: [id number] Arrival date & time: 10/04/21  4259  An emergency department physician performed an initial assessment on this suspected stroke patient at 0919.  History  Chief Complaint  Patient presents with   Diplopia   Numbness    Anita [name] is a 41 y.o. female with a past medical history significant for seizures who presents to the ED due to diplopia, left-sided numbness/tingling, and left-sided weakness.  Patient states on Saturday morning around 9:30 AM she developed left arm pain which she describes as a "squeezing sensation".  Denies any numbness at that time.  Left arm pain occasionally went into left side of chest. Patient states symptoms resolved. She then woke up on Sunday morning with bilateral diplopia which resolved around 1PM. This morning, she woke up around 4:30AM with severe diplopia and nausea. Nausea resolved; however diplopia continued. Around 7:45AM, she developed left-sided weakness and notes she began dropping things. Per coworker who was at bedside during initial evaluation notes that her speech is slower than normal. Patient also endorses so left leg weakness and is unable to ambulate like her normal self. No previous CVA.      Home Medications Prior to Admission medications   Medication Sig Start Date End Date Taking? Authorizing Provider  lacosamide (VIMPAT) 200 MG TABS tablet Take 1 tablet (200 mg total) by mouth at bedtime. 04/27/21   Marcial Pacas, MD  lamoTRIgine (LAMICTAL) 200 MG tablet Take 1.5 tablets (300 mg total) by mouth 2 (two) times daily. 08/04/21   Suzzanne Cloud, NP      Allergies    Dye fdc red [food color pink] and Flagyl [metronidazole hcl]    Review of Systems   Review of Systems  Eyes:  Positive for visual disturbance.  Neurological:  Positive for speech difficulty, weakness and numbness.   Physical Exam Updated Vital Signs BP (!) 136/91    Pulse 99    Temp 97.8  F (36.6 C) (Oral)    Resp (!) 31    Ht 5\' 7"  (1.702 m)    Wt 85.3 kg    SpO2 99%    BMI 29.44 kg/m  Physical Exam Vitals and nursing note reviewed.  Constitutional:      General: She is not in acute distress.    Appearance: She is not ill-appearing.  HENT:     Head: Normocephalic.  Eyes:     Pupils: Pupils are equal, round, and reactive to light.  Cardiovascular:     Rate and Rhythm: Normal rate and regular rhythm.     Pulses: Normal pulses.     Heart sounds: Normal heart sounds. No murmur heard.   No friction rub. No gallop.  Pulmonary:     Effort: Pulmonary effort is normal.     Breath sounds: Normal breath sounds.  Abdominal:     General: Abdomen is flat. There is no distension.     Palpations: Abdomen is soft.     Tenderness: There is no abdominal tenderness. There is no guarding or rebound.  Musculoskeletal:        General: Normal range of motion.     Cervical back: Neck supple.  Skin:    General: Skin is warm and dry.  Neurological:     Mental Status: She is alert.     Comments: Decreased grip strength on left, LLE with decreased strength. No facial droop. Slowed speech. No pronator drift.   Psychiatric:  Mood and Affect: Mood normal.        Behavior: Behavior normal.    ED Results / Procedures / Treatments   Labs (all labs ordered are listed, but only abnormal results are displayed) Labs Reviewed  CBC - Abnormal; Notable for the following components:      Result Value   Platelets 428 (*)    All other components within normal limits  URINALYSIS, ROUTINE W REFLEX MICROSCOPIC - Abnormal; Notable for the following components:   APPearance HAZY (*)    Specific Gravity, Urine <1.005 (*)    All other components within normal limits  CBG MONITORING, ED - Abnormal; Notable for the following components:   Glucose-Capillary 114 (*)    All other components within normal limits  I-STAT CHEM 8, ED - Abnormal; Notable for the following components:   Calcium, Ion  1.05 (*)    Hemoglobin 15.3 (*)    All other components within normal limits  RESP PANEL BY RT-PCR (FLU A&B, COVID) ARPGX2  ETHANOL  PROTIME-INR  APTT  DIFFERENTIAL  COMPREHENSIVE METABOLIC PANEL  RAPID URINE DRUG SCREEN, HOSP PERFORMED  I-STAT BETA HCG BLOOD, ED ([facility], [facility], [facility] ONLY)  TROPONIN I (HIGH SENSITIVITY)    EKG EKG Interpretation  Date/Time:  Monday October 04 2021 08:39:16 EST Ventricular Rate:  107 PR Interval:  147 QRS Duration: 92 QT Interval:  334 QTC Calculation: 446 R Axis:   69 Text Interpretation: Sinus tachycardia Confirmed by Lajean Saver [phone]) on 10/04/2021 9:24:59 AM  Radiology CT HEAD CODE STROKE WO CONTRAST  Result Date: 10/04/2021 CLINICAL DATA:  Code stroke.  Neuro deficit, acute, stroke suspected EXAM: CT HEAD WITHOUT CONTRAST TECHNIQUE: Contiguous axial images were obtained from the base of the skull through the vertex without intravenous contrast. RADIATION DOSE REDUCTION: This exam was performed according to the departmental dose-optimization program which includes automated exposure control, adjustment of the mA and/or kV according to patient size and/or use of iterative reconstruction technique. COMPARISON:  CT head 08/28/2012. FINDINGS: Brain: No evidence of acute large vascular territory infarction, hemorrhage, hydrocephalus, extra-axial collection or mass lesion/mass effect. Vascular: Hyperdense vessel identified. Skull: No acute fracture. Sinuses/Orbits: Clear sinuses. Other: No mastoid effusions. ASPECTS [facility] Stroke Program Early CT Score) total score (0-10 with 10 being normal): 10. IMPRESSION: No evidence of acute abnormality. ASPECTS is 10. Code stroke imaging results were communicated on 10/04/2021 at 9:37 am to provider Palikh via telephone, who verbally acknowledged these results. Electronically Signed   By: Margaretha Sheffield M.D.   On: 10/04/2021 09:41    Procedures .Critical Care Performed by: Suzy Bouchard, PA-C Authorized by:  Suzy Bouchard, PA-C   Critical care provider statement:    Critical care time (minutes):  30   Critical care was necessary to treat or prevent imminent or life-threatening deterioration of the following conditions:  CNS failure or compromise   Critical care was time spent personally by me on the following activities:  Development of treatment plan with patient or surrogate, discussions with consultants, evaluation of patient's response to treatment, examination of patient, ordering and review of laboratory studies, ordering and review of radiographic studies, ordering and performing treatments and interventions, pulse oximetry, re-evaluation of patient's condition and review of old charts   I assumed direction of critical care for this patient from another provider in my specialty: no     Care discussed with: admitting provider      Medications Ordered in ED Medications - No data to display  ED  Course/ Medical Decision Making/ A&P                           Medical Decision Making Amount and/or Complexity of Data Reviewed Independent Historian: spouse and friend    Details: husband and co-worker at bedside provided history. External Data Reviewed: notes.    Details: neurology notes Labs: ordered. Decision-making details documented in ED Course. Radiology: ordered and independent interpretation performed. Decision-making details documented in ED Course. ECG/medicine tests: ordered and independent interpretation performed. Decision-making details documented in ED Course.  Risk Decision regarding hospitalization.  41 year old female presents to the ED due to bilateral diplopia, left upper extremity numbness/tingling and left-sided weakness.  Left-sided weakness started abruptly at 7:45 AM this morning where she notes she began dropping things.  Patient notes she has chronic left arm issues. She also notes some weakness in her left leg. No previous CVA. Diplopia has been present  intermittently since yesterday.  Upon arrival, patient afebrile with mild tachycardia at 109 with otherwise unremarkable vitals.  Patient in no acute distress.  Physical exam significant for decreased grip strength on the left. Decreased strength of LLE.  No facial droop.  No pronator drift.  After initial evaluation, code stroke initiated due to left-sided weakness given patient is within the TNK window. Discussed with Dr. Ashok Cordia who agrees with assessment and plan. Will also obtain cardiac labs to rule out cardiac etiology given left arm pain; however suspicion lower given no cardiac history. Low suspicion for dissection.  This patient presents to the ED for concern of left-sided weakness, diplopia this involves an extensive number of treatment options, and is a complaint that carries with it a high risk of complications and morbidity.  The differential diagnosis includes CVA, TIA, cardiac etiology, MSK, etc.  CBC reassuring.  No leukocytosis and normal hemoglobin.  UDS negative.  CMP unremarkable.  Normal renal function.  No major electrolyte derangements.  Troponin normal.  Ethanol within normal limits.  CT head personally reviewed and interpreted which is negative for any acute abnormalities.  Agree with radiology interpretation.  EKG demonstrates sinus tachycardia.  No signs of acute ischemia.  Discussed with neurology at bedside who recommends MRI brain, EEG, and hospitalist admission for further stroke/TIA work-up.  10:18 AM Discussed with Dr. Volanda Napoleon with family medicine who agrees to admit patient for further work-up. COVID pending.   Co morbidities that complicate the patient evaluation  seizures  Cardiac Monitoring:  The patient was maintained on a cardiac monitor.  I personally viewed and interpreted the cardiac monitored which showed an underlying rhythm of: sinus tachycardia  Critical Interventions:  Code stroke activated to ensure STAT CT image  Reevaluation:  After the  interventions noted above, I reevaluated the patient and found that they have :stayed the same        Final Clinical Impression(s) / ED Diagnoses Final diagnoses:  Diplopia  Numbness  Weakness    Rx / DC Orders ED Discharge Orders     None         Karie Kirks 10/04/21 1022    Lajean Saver, MD 10/04/21 1253

## 2021-10-04 NOTE — Progress Notes (Signed)
EEG complete - results pending 

## 2021-10-04 NOTE — H&P (Signed)
[facility] Admission History and Physical Service Pager: [phone]  Patient name: Anita [name] Medical record number: [id number] Date of birth: 1980-10-08 Age: 41 y.o. Gender: female  Primary Care Provider: Dickie La, MD Consultants: Neurology Code Status: Full  Preferred Emergency Contact: Tawni Levy (husband) [phone]  Chief Complaint: Left forearm pain/weakness, left leg weakness, double vision  Assessment and Plan: Shylo Zamor is a 41 y.o. female presenting with left side weakness, left forearm pain and double vision. PMH is significant for seizure activity, thrombocytosis.   Left side weakness/pain Differentials unclear stroke versus TIA versus seizure.  CT head shows no evidence of acute infarct, hemorrhage or acute abnormality.  Labs significant for mild hypocalcemia.  On exam she has mild left upper and lower extremity weakness and decreased sensation.  NIHSS sore 3.  Plan to admit for stroke work-up. If negative workup, could consider MS outpatient workup with Neurolgist -Neurology has been consulted, appreciate recommendations -TSH, A1c, lipid panel -Follow-up MRI/MRA head -Follow-up TTE -Follow-up carotid Dopplers -ASA 81 mg daily -Start statin if LDL greater then 70 -Follow-up EEG -PT/OT in a.m. -Neuro/vital signs every 2 x 24 hours -F/U Orthostatics -Tylenol prn  H/O Seizure Activity Patient is followed by [facility] neurological cessation.  Was last seen 08/04/2021.  Compliant with Vimpat and Lamictal.  Last seizure activity in 2016 secondary to abrupt discontinuation of Klonopin. -Lamictal level -Vimpat level -Seizure precautions -Follow-up EEG -Follow-up with neurology outpatient  Iron deficiency anemia Last ferritin 7.  Patient has been followed by hematology for his history of thrombocytosis.  Was recommended to start iron.  Patient reports taking multivitamins. -Repeat ferritin   FEN/GI: Regular  Diet Prophylaxis: SCD  Disposition: Progressive  History of Present Illness:  Anita [name] is a 41 y.o. female presenting with left-side arm pain and weakness.  Patient reports woke up on Saturday with squeezing pain in her left arm that lasted 2 to 3 hours.  Noted headache and neck pain as well.  Took 1 pack of Goody power which seemed to resolve.  Sunday morning woke up with double vision that lasted 2-2.5 hours.  Double vision more notable with both eyes open.  Again noted to have left-sided weakness in arm and leg.  Denies any nausea, vomiting, shortness of breath during episodes.  Husband at bedside noted speech a little off.  She did have some nausea this morning along with left-sided arm and leg weakness.  Also noted to have some tingling around her lips.  Denies any tobacco use, THC, EtOH or recreational drug use. Does report lack of sleep at night with 2 small twins. Reports history of seizures that has been well controlled since 2016.  Indicates that this does not seem like a normal seizure-like activity for her.  In the ED she was noted to have diplopia, left-sided numbness/tingling and weakness.  She was afebrile, mild tachycardia and normotensive.  Code stroke was called, CT head negative for acute stroke, hemorrhage or abnormality.  Neurology had been consulted in evaluated.    Review Of Systems: Per HPI with the following additions:   Review of Systems  Constitutional:  Negative for activity change, fatigue and fever.  Cardiovascular:  Positive for chest pain. Negative for leg swelling.  Gastrointestinal:  Positive for nausea. Negative for vomiting.  Musculoskeletal:  Positive for myalgias and neck pain.  Neurological:  Positive for dizziness, seizures, speech difficulty, weakness and headaches. Negative for syncope.  Psychiatric/Behavioral:  Positive for sleep disturbance. Negative for  confusion.     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   Therapeutic drug monitoring 12/24/2012   Polycystic ovaries 05/14/2012    Past Medical History: Past Medical History:  Diagnosis Date   Anemia    Anxiety    H/O: hysterectomy 07/07/2021   Heart murmur    Newborn product of in vitro fertilization (IVF) pregnancy    Seizures ([place])    Last seizure in 2013    Past Surgical History: Past Surgical History:  Procedure Laterality Date   arm surgery     CESAREAN SECTION MULTI-GESTATIONAL WITH TUBAL N/A 08/25/2017   Procedure: CESAREAN SECTION MULTI-GESTATIONAL WITH TUBAL;  Surgeon: Louretta Shorten, MD;  Location: [place];  Service: Obstetrics;  Laterality: N/A;  Primary edc 09/08/17 need RNFA   HYSTEROSCOPY WITH D & C N/A 10/09/2015   Procedure: DILATATION AND CURETTAGE /HYSTEROSCOPY ;  Surgeon: Louretta Shorten, MD;  Location: [facility];  Service: Gynecology;  Laterality: N/A;   LAPAROSCOPY N/A 10/09/2015   Procedure: LAPAROSCOPY DIAGNOSTIC with CO 2 laser  WITH CHROMOPERTUBATION AND LASER OF ENDOMETRIOSIS WITH LYSIS OF ADHESIONS;  Surgeon: Louretta Shorten, MD;  Location: [facility];  Service: Gynecology;  Laterality: N/A;    Social History: Social History   Tobacco Use   Smoking status: Never   Smokeless tobacco: Never  Substance Use Topics   Alcohol use: No   Drug use: No   Additional social history: None Please also refer to relevant sections of EMR.  Family History: Family History  Problem Relation Age of Onset   Healthy Mother    Healthy Father    Heart disease Maternal Grandmother    Bone cancer Maternal Grandfather     Allergies and Medications: Allergies  Allergen Reactions   Dye Fdc Red [Food  Color Pink] Nausea And Vomiting   Flagyl [Metronidazole Hcl]     GI upset   No current facility-administered medications on file prior to encounter.   Current Outpatient Medications on File Prior to Encounter  Medication Sig Dispense Refill   lacosamide (VIMPAT) 200 MG TABS tablet Take 1 tablet (200 mg total) by mouth at bedtime. 90 tablet 1   lamoTRIgine (LAMICTAL) 200 MG tablet Take 1.5 tablets (300 mg total) by mouth 2 (two) times daily. 270 tablet 4    Objective: BP 115/84    Pulse 84    Temp 97.8 F (36.6 C) (Oral)    Resp (!) 26    Ht 5\' 7"  (1.702 m)    Wt 85.3 kg    SpO2 100%    BMI 29.44 kg/m  Exam: General: Alert and oriented, no apparent distress  ENTM: No pharyengeal erythema Neck: nontender Cardiovascular: RRR with no murmurs noted, no carotid bruits appreciated Respiratory: CTA bilaterally  Gastrointestinal: Bowel sounds present. No abdominal pain MSK: RUE/RLE strength 5/5;  LUE/LLE strength 4/5, positive Spurling's  Derm: No rashes noted Psych: Behavior and speech appropriate to situation   Neuro: CN II: PERRL CN III, IV,VI: EOMI CV V: Normal sensation in V1, V2, V3 CVII: Symmetric smile and brow raise CN VIII: Normal hearing CN IX,X: Symmetric palate raise  CN XI: 5/5 shoulder shrug CN XII: Symmetric tongue protrusion  RUE/RLE strength  5/5;  LUE/LLE strength 4/5  2+ UE and LE reflexes  Decreased sensation in LUE and LLE  No ataxia with finger to nose, Left heel to shin ataxia Negative Rhomberg   Positives left Tinel's sign  Labs and Imaging: CBC BMET  Recent Labs  Lab 10/04/21 0920 10/04/21 0925  WBC 5.9  --   HGB 14.7 15.3*  HCT 44.5 45.0  PLT 428*  --    Recent Labs  Lab 10/04/21 0920 10/04/21 0925  NA 137 139  K 3.6 3.9  CL 102 105  CO2 25  --   BUN 9 11  CREATININE 0.78 0.60  GLUCOSE 96 91  CALCIUM 9.6  --      EKG: My own interpretation (not copied from electronic read) Sinus Tachycardia without ST elevation    CT HEAD CODE  STROKE WO CONTRAST  Result Date: 10/04/2021 CLINICAL DATA:  Code stroke.  Neuro deficit, acute, stroke suspected EXAM: CT HEAD WITHOUT CONTRAST TECHNIQUE: Contiguous axial images were obtained from the base of the skull through the vertex without intravenous contrast. RADIATION DOSE REDUCTION: This exam was performed according to the departmental dose-optimization program which includes automated exposure control, adjustment of the mA and/or kV according to patient size and/or use of iterative reconstruction technique. COMPARISON:  CT head 08/28/2012. FINDINGS: Brain: No evidence of acute large vascular territory infarction, hemorrhage, hydrocephalus, extra-axial collection or mass lesion/mass effect. Vascular: Hyperdense vessel identified. Skull: No acute fracture. Sinuses/Orbits: Clear sinuses. Other: No mastoid effusions. ASPECTS [facility] Stroke Program Early CT Score) total score (0-10 with 10 being normal): 10. IMPRESSION: No evidence of acute abnormality. ASPECTS is 10. Code stroke imaging results were communicated on 10/04/2021 at 9:37 am to provider Palikh via telephone, who verbally acknowledged these results. Electronically Signed   By: Margaretha Sheffield M.D.   On: 10/04/2021 09:41     Carollee Leitz, MD 10/04/2021, 1:26 PM PGY-3, [place] pager: [phone], text pages welcome

## 2021-10-05 ENCOUNTER — Other Ambulatory Visit (HOSPITAL_COMMUNITY): Payer: Self-pay

## 2021-10-05 DIAGNOSIS — R531 Weakness: Secondary | ICD-10-CM

## 2021-10-05 DIAGNOSIS — M5417 Radiculopathy, lumbosacral region: Secondary | ICD-10-CM

## 2021-10-05 DIAGNOSIS — R200 Anesthesia of skin: Secondary | ICD-10-CM

## 2021-10-05 DIAGNOSIS — H532 Diplopia: Secondary | ICD-10-CM

## 2021-10-05 LAB — COMPREHENSIVE METABOLIC PANEL
ALT: 12 U/L (ref 0–44)
AST: 13 U/L — ABNORMAL LOW (ref 15–41)
Albumin: 4.1 g/dL (ref 3.5–5.0)
Alkaline Phosphatase: 46 U/L (ref 38–126)
Anion gap: 6 (ref 5–15)
BUN: 12 mg/dL (ref 6–20)
CO2: 26 mmol/L (ref 22–32)
Calcium: 9 mg/dL (ref 8.9–10.3)
Chloride: 104 mmol/L (ref 98–111)
Creatinine, Ser: 0.67 mg/dL (ref 0.44–1.00)
GFR, Estimated: >60 mL/min (ref >60)
Glucose, Bld: 90 mg/dL (ref 70–99)
Potassium: 4.1 mmol/L (ref 3.5–5.1)
Sodium: 136 mmol/L (ref 135–145)
Total Bilirubin: 0.1 mg/dL — ABNORMAL LOW (ref 0.3–1.2)
Total Protein: 7.3 g/dL (ref 6.5–8.1)

## 2021-10-05 LAB — CBC
HCT: 42.4 % (ref 36.0–46.0)
Hemoglobin: 13.8 g/dL (ref 12.0–15.0)
MCH: 29.5 pg (ref 26.0–34.0)
MCHC: 32.5 g/dL (ref 30.0–36.0)
MCV: 90.6 fL (ref 80.0–100.0)
Platelets: 431 10*3/uL — ABNORMAL HIGH (ref 150–400)
RBC: 4.68 MIL/uL (ref 3.87–5.11)
RDW: 12.3 % (ref 11.5–15.5)
WBC: 7.5 10*3/uL (ref 4.0–10.5)
nRBC: 0 % (ref 0.0–0.2)

## 2021-10-05 LAB — LAMOTRIGINE LEVEL: Lamotrigine Lvl: 14.1 ug/mL (ref 2.0–20.0)

## 2021-10-05 MED ORDER — FERROUS SULFATE 325 (65 FE) MG PO TABS
325.0000 mg | ORAL_TABLET | ORAL | 0 refills | Status: DC
  Filled 2021-10-05: qty 30, 60d supply, fill #0

## 2021-10-05 NOTE — Hospital Course (Addendum)
Anita [name] is a 41 y.o. female PMHx of seizures (last seizure 2013), anemia, anxiety, heart murmur presented to [facility] due to double vision, tingling in left arm, and left sided weakness  Left side weakness/pain   paresthesias NOS Code stroke was called and acute stroke was ruled out given negative Brain MRI and CT head. Carotid ultrasound was negative for thrombus or plaque. Echocardiogram showed EF of 60 to 65% and grade 1 diastolic dysfunction. EEG showed 1 isolated epileptiform discharge suggestive of an underlying foci of epileptogenic potential (has known history of seizures. TSH 1.227. Lipid panel with total 204, LDL 127. A1c was 5.0.  Next day patient complained of tingling in palms and soles bilaterally as well is back pain in her mid back.  Neurology recommended PT/OT and to consider nerve conduction study as outpatient.  They said that it was less likely to be related to lacosamide given she has been on it since 2013. They did say to consider component of functional issue as well to her paresthesias.  Did not continue her on aspirin started in the hospital given she did not have a stroke.  Intermittent diplopia Patient had intermittent diplopia during hospitalization that was monocular.  Neurology recommended ophthalmology follow-up outpatient.  Extraocular movements were intact.  Seizure history Was continued on home medications of Vimpat and Lamictal.  Levels were pending at discharge but patient has been compliant.  Hyperlipidemia LDL was 127 and total cholesterol was 204.  ASCVD risk 0.3% did not start patient on statin in hospital.  Iron Deficiency Anemia Last ferritin of 7. Follows with hematology for thrombocytosis. Repeat ferritin was 22.  Started her on ferrous sulfate 325 mg every other day at discharge.

## 2021-10-05 NOTE — Progress Notes (Signed)
Anita [name] D/C'd Home per MD order.  Discussed with the patient and all questions fully answered.  VSS, Skin clean, dry and intact without evidence of skin break down, no evidence of skin tears noted. IV catheter discontinued intact. Site without signs and symptoms of complications. Dressing and pressure applied.  An After Visit Summary was printed and given to the patient. Patient received prescription.  D/c education completed with patient/family including follow up instructions, medication list, d/c activities limitations if indicated, with other d/c instructions as indicated by MD - patient able to verbalize understanding, all questions fully answered.   Patient instructed to return to ED, call 911, or call MD for any changes in condition.   Patient escorted to the elevator, and D/C home via private auto.  Dorris Carnes 10/05/2021 3:19 PM

## 2021-10-05 NOTE — Discharge Instructions (Addendum)
Dear Anita [name],   Thank you so much for allowing [place] to be part of your care!  You were admitted to [facility] for left sided weakness and you were ruled out for having a stroke. Neurology recommended getting nerve conduction studies outpatient. Please see your eye doctor for your double vision.   POST-HOSPITAL & CARE INSTRUCTIONS Please schedule an appointment with your eye doctor Please let PCP/Specialists know of any changes that were made.  Please see medications section of this packet for any medication changes.   DOCTOR'S APPOINTMENT & FOLLOW UP CARE INSTRUCTIONS  Future Appointments  Date Time Provider [place]  10/20/2021  9:10 AM Dickie La, MD [facility]  08/10/2022  8:45 AM Suzzanne Cloud, NP [facility] None    RETURN PRECAUTIONS:   Take care and be well!  [facility]  [place], [place] [address] [phone]

## 2021-10-05 NOTE — Plan of Care (Signed)
°  Problem: Clinical Measurements: Goal: Ability to maintain clinical measurements within normal limits will improve Outcome: Progressing   Problem: Clinical Measurements: Goal: Ability to maintain clinical measurements within normal limits will improve Outcome: Progressing Goal: Will remain free from infection Outcome: Progressing Goal: Diagnostic test results will improve Outcome: Progressing Goal: Respiratory complications will improve Outcome: Progressing Goal: Cardiovascular complication will be avoided Outcome: Progressing   Problem: Safety: Goal: Ability to remain free from injury will improve Outcome: Progressing

## 2021-10-05 NOTE — TOC Transition Note (Signed)
Transition of Care [facility]) - CM/SW Discharge Note   Patient Details  Name: [name] MRN: [id number] Date of Birth: 31-Jan-1981  Transition of Care [facility] CM/SW Contact:  Pollie Friar, RN Phone Number: 10/05/2021, 1:38 PM   Clinical Narrative:    Patient is from home with her spouse and children. She has supervision. No DME needed at home and she denies any transportation or home medication issues. No f/u per PT/OT.  Pt has transportation home once discharged.    Final next level of care: Home/Self Care Barriers to Discharge: No Barriers Identified   Patient Goals and CMS Choice        Discharge Placement                       Discharge Plan and Services                                     Social Determinants of Health (SDOH) Interventions     Readmission Risk Interventions No flowsheet data found.

## 2021-10-05 NOTE — Progress Notes (Addendum)
STROKE TEAM PROGRESS NOTE   INTERVAL HISTORY Her husband is at the bedside. This morning she had an episode of nerve pain in her back and down lower legs this morning with diplopia lasting about 1 hour. She is able to recall the events leading up to hospitalization. She states that on Saturday her left arm hurt, Sunday diplopia (objects side by side) persistent with covering one eye which lasted a couple of hours. She had tingling in her arms and legs on Sunday. Yesterday it happened again. At [place] she dropped something out of her left hand which prompted her to go to the ED. She had weakness on right side and diplopia lasted which lasted until just after CT. Currently she has no focal weakness or diplopia.    Vitals:   10/04/21 2341 10/05/21 0310 10/05/21 0730 10/05/21 1140  BP: 110/70 97/70 106/76 110/78  Pulse: 86 80 79 86  Resp: 14 17 18 18   Temp: 98.5 F (36.9 C) 98.8 F (37.1 C) 98.4 F (36.9 C) 98.3 F (36.8 C)  TempSrc: Oral Oral Oral Oral  SpO2: 98% 98% 98% 99%  Weight:      Height:       CBC:  Recent Labs  Lab 10/04/21 0920 10/04/21 0925 10/05/21 0254  WBC 5.9  --  7.5  NEUTROABS 2.8  --   --   HGB 14.7 15.3* 13.8  HCT 44.5 45.0 42.4  MCV 91.2  --  90.6  PLT 428*  --  403*   Basic Metabolic Panel:  Recent Labs  Lab 10/04/21 0920 10/04/21 0925 10/05/21 0254  NA 137 139 136  K 3.6 3.9 4.1  CL 102 105 104  CO2 25  --  26  GLUCOSE 96 91 90  BUN 9 11 12   CREATININE 0.78 0.60 0.67  CALCIUM 9.6  --  9.0   Lipid Panel:  Recent Labs  Lab 10/04/21 1453  CHOL 204*  TRIG 32  HDL 71  CHOLHDL 2.9  VLDL 6  LDLCALC 127*   HgbA1c:  Recent Labs  Lab 10/04/21 1454  HGBA1C 5.0   Urine Drug Screen:  Recent Labs  Lab 10/04/21 0907  LABOPIA NONE DETECTED  COCAINSCRNUR NONE DETECTED  LABBENZ NONE DETECTED  AMPHETMU NONE DETECTED  THCU NONE DETECTED  LABBARB NONE DETECTED    Alcohol Level  Recent Labs  Lab 10/04/21 0920  ETH <10    IMAGING past 24  hours MR ANGIO HEAD WO CONTRAST  Result Date: 10/04/2021 CLINICAL DATA:  Neuro deficit, acute, stroke suspected EXAM: MRI HEAD WITHOUT CONTRAST MRA HEAD WITHOUT CONTRAST TECHNIQUE: Multiplanar, multi-echo pulse sequences of the brain and surrounding structures were acquired without intravenous contrast. Angiographic images of the Circle of Willis were acquired using MRA technique without intravenous contrast. COMPARISON:  No pertinent prior exam. FINDINGS: MRI HEAD Brain: There is no acute infarction or intracranial hemorrhage. There is no intracranial mass, mass effect, or edema. There is no hydrocephalus or extra-axial fluid collection. Ventricles and sulci are normal in size and configuration. Vascular: Major vessel flow voids at the skull base are preserved. Skull and upper cervical spine: Normal marrow signal is preserved. Sinuses/Orbits: Paranasal sinuses are aerated. Orbits are unremarkable. Other: Sella is unremarkable.  Mastoid air cells are clear. MRA HEAD Intracranial internal carotid arteries are patent. Middle and anterior cerebral arteries are patent. Intracranial vertebral arteries, basilar artery, posterior cerebral arteries are patent. There is no significant stenosis or aneurysm. IMPRESSION: No evidence of recent infarction, hemorrhage, or mass.  Unremarkable vascular imaging. Electronically Signed   By: Macy Mis M.D.   On: 10/04/2021 13:58   MR BRAIN WO CONTRAST  Result Date: 10/04/2021 CLINICAL DATA:  Neuro deficit, acute, stroke suspected EXAM: MRI HEAD WITHOUT CONTRAST MRA HEAD WITHOUT CONTRAST TECHNIQUE: Multiplanar, multi-echo pulse sequences of the brain and surrounding structures were acquired without intravenous contrast. Angiographic images of the Circle of Willis were acquired using MRA technique without intravenous contrast. COMPARISON:  No pertinent prior exam. FINDINGS: MRI HEAD Brain: There is no acute infarction or intracranial hemorrhage. There is no intracranial mass,  mass effect, or edema. There is no hydrocephalus or extra-axial fluid collection. Ventricles and sulci are normal in size and configuration. Vascular: Major vessel flow voids at the skull base are preserved. Skull and upper cervical spine: Normal marrow signal is preserved. Sinuses/Orbits: Paranasal sinuses are aerated. Orbits are unremarkable. Other: Sella is unremarkable.  Mastoid air cells are clear. MRA HEAD Intracranial internal carotid arteries are patent. Middle and anterior cerebral arteries are patent. Intracranial vertebral arteries, basilar artery, posterior cerebral arteries are patent. There is no significant stenosis or aneurysm. IMPRESSION: No evidence of recent infarction, hemorrhage, or mass. Unremarkable vascular imaging. Electronically Signed   By: Macy Mis M.D.   On: 10/04/2021 13:58   ECHOCARDIOGRAM COMPLETE  Result Date: 10/04/2021    ECHOCARDIOGRAM REPORT   Patient Name:   BRAEDYN RIGGLE Holleran Date of Exam: 10/04/2021 Medical Rec #:  [id number]         Height:       67.0 in Accession #:    [id number]        Weight:       188.0 lb Date of Birth:  [birth year]          BSA:          1.970 m Patient Age:    43 years          BP:           115/84 mmHg Patient Gender: F                 HR:           86 bpm. Exam Location:  Inpatient Procedure: 2D Echo, Color Doppler and Cardiac Doppler Indications:    TIA  History:        Patient has no prior history of Echocardiogram examinations.  Sonographer:    Melissa Morford RDCS (AE, PE) Referring Phys: [place]  Sonographer Comments: Suboptimal parasternal window. IMPRESSIONS  1. Left ventricular ejection fraction, by estimation, is 60 to 65%. The left ventricle has normal function. The left ventricle has no regional wall motion abnormalities. Left ventricular diastolic parameters are consistent with Grade I diastolic dysfunction (impaired relaxation).  2. Right ventricular systolic function is normal. The right ventricular size is normal.  3. The  mitral valve is normal in structure. No evidence of mitral valve regurgitation. No evidence of mitral stenosis.  4. The aortic valve is normal in structure. Aortic valve regurgitation is not visualized. No aortic stenosis is present.  5. The inferior vena cava is normal in size with greater than 50% respiratory variability, suggesting right atrial pressure of 3 mmHg. Conclusion(s)/Recommendation(s): No intracardiac source of embolism detected on this transthoracic study. Consider a transesophageal echocardiogram to exclude cardiac source of embolism if clinically indicated. FINDINGS  Left Ventricle: Left ventricular ejection fraction, by estimation, is 60 to 65%. The left ventricle has normal function. The left ventricle has no regional wall motion  abnormalities. The left ventricular internal cavity size was normal in size. There is  no left ventricular hypertrophy. Left ventricular diastolic parameters are consistent with Grade I diastolic dysfunction (impaired relaxation). Right Ventricle: The right ventricular size is normal. No increase in right ventricular wall thickness. Right ventricular systolic function is normal. Left Atrium: Left atrial size was normal in size. Right Atrium: Right atrial size was normal in size. Pericardium: There is no evidence of pericardial effusion. Mitral Valve: The mitral valve is normal in structure. No evidence of mitral valve regurgitation. No evidence of mitral valve stenosis. Tricuspid Valve: The tricuspid valve is normal in structure. Tricuspid valve regurgitation is not demonstrated. No evidence of tricuspid stenosis. Aortic Valve: The aortic valve is normal in structure. Aortic valve regurgitation is not visualized. No aortic stenosis is present. Pulmonic Valve: The pulmonic valve was normal in structure. Pulmonic valve regurgitation is not visualized. No evidence of pulmonic stenosis. Aorta: The aortic root is normal in size and structure. Venous: The inferior vena cava is  normal in size with greater than 50% respiratory variability, suggesting right atrial pressure of 3 mmHg. IAS/Shunts: No atrial level shunt detected by color flow Doppler.  LEFT VENTRICLE PLAX 2D LVIDd:         4.23 cm      Diastology LVIDs:         2.91 cm      LV e' medial:    8.16 cm/s LV PW:         0.82 cm      LV E/e' medial:  9.5 LV IVS:        0.67 cm      LV e' lateral:   13.30 cm/s LVOT diam:     1.90 cm      LV E/e' lateral: 5.8 LV SV:         79 LV SV Index:   40 LVOT Area:     2.84 cm  LV Volumes (MOD) LV vol d, MOD A2C: 117.0 ml LV vol d, MOD A4C: 86.8 ml LV vol s, MOD A2C: 39.3 ml LV vol s, MOD A4C: 28.6 ml LV SV MOD A2C:     77.7 ml LV SV MOD A4C:     86.8 ml LV SV MOD BP:      72.4 ml RIGHT VENTRICLE TAPSE (M-mode): 2.2 cm LEFT ATRIUM             Index        RIGHT ATRIUM           Index LA diam:        2.90 cm 1.47 cm/m   RA Area:     12.10 cm LA Vol (A2C):   40.2 ml 20.41 ml/m  RA Volume:   24.30 ml  12.34 ml/m LA Vol (A4C):   30.3 ml 15.38 ml/m LA Biplane Vol: 35.1 ml 17.82 ml/m  AORTIC VALVE LVOT Vmax:   153.00 cm/s LVOT Vmean:  103.000 cm/s LVOT VTI:    0.278 m  AORTA Ao Root diam: 2.30 cm Ao Asc diam:  3.00 cm MITRAL VALVE MV Area (PHT): 3.68 cm    SHUNTS MV Decel Time: 206 msec    Systemic VTI:  0.28 m MV E velocity: 77.60 cm/s  Systemic Diam: 1.90 cm MV A velocity: 71.60 cm/s MV E/A ratio:  1.08 Candee Furbish MD Electronically signed by Candee Furbish MD Signature Date/Time: 10/04/2021/2:20:22 PM    Final     PHYSICAL EXAM  Physical  Exam  Constitutional: Appears well-developed and well-nourished.  Psych: Affect appropriate to situation Cardiovascular: Normal rate and regular rhythm.  Respiratory: Effort normal, non-labored breathing  Neuro: Mental Status: Patient is awake, alert, oriented to person, place, month, year, and situation. Patient is able to give a clear and coherent history. No signs of aphasia or neglect Cranial Nerves: II: Visual Fields are full. Pupils are  equal, round, and reactive to light.   III,IV, VI: EOMI without ptosis or diplopia. Intermittent diplopia  V: Facial sensation is symmetric to temperature VII: Facial movement is symmetric resting and smiling VIII: Hearing is intact to voice X: Palate elevates symmetrically XI: Shoulder shrug is symmetric. XII: Tongue protrudes midline without atrophy or fasciculations.  Motor: Tone is normal. Bulk is normal. 5/5 strength was present in all four extremities.  Sensory: Sensation is symmetric to light touch and temperature in the arms and legs. No extinction to DSS present.   ASSESSMENT/PLAN Ms. Anita [name] is a 41 y.o. female with history of seizures (last seizure 2013), anemia, anxiety, heart murmur on vimpat and lamictal presenting with double vision, tingling in left arm, and left sided weakness that have been fluctuating over the last few days. Recommend nerve conduction testing outpatient as well as Anita [name]/OT evaluation. No symptomatic seizures noted, no changes made to seizure medications at this time.   Nerve impingement in lower back Code Stroke CT head- No acute abnormality. ASPECTS 10.    MRI  No evidence of recent infarction, hemorrhage, or mass. Unremarkable vascular imaging. MRA-  All intracranial arteries patent, no significant stenosis or mass.  Carotid Doppler  no evidence of thrombus, dissection, plaque, or stenosis  2D Echo EF 60-65%, no regional wall abnormalities.  EEG- Abnormal awake and asleep routine EEG due to one isolated epileptiform discharge embedded in a K complex during stage II sleep maximal over the bilateral frontocentral head regions. 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 No seizures or evolving ictal patterns seen LDL 127 HgbA1c 5.0 VTE prophylaxis - SCDs Therapy recommendations:  pending Disposition:  Pending  Hyperlipidemia LDL 127, goal < 70 Consider adding statin  Other Active Problems Seizures Well controlled  on vimpat 200mg  daily and lamictal 300mg  BID Back pain with neuropathy Anita [name]/OT evaluation Recommend nerve conduction testing outpatient   Hospital day # 1  Patient seen and examined by NP/APP with MD. MD to update note as needed.   Janine Ores, DNP, FNP-BC [facility] Pager: [phone]  ATTENDING NOTE: I reviewed above note and agree with the assessment and plan. Anita [name] was seen and examined.   41 year old female with history of seizure on Vimpat and Lamictal, and anxiety admitted for bilateral monocular diplopia, bilateral upper and lower extremity tingling, left shoulder pain and weakness.  CT, MRI, MRA head, carotid Doppler all unremarkable.  EF 60 to 65%.  LDL 127, A1c 5.0.  Creatinine 0.60.  On exam, husband at bedside, patient complaining of doing well this morning, walking with Anita [name] OT without problem.  And then she said in bed for 5 minutes and started to have lower back pain and bilateral lower extremity tingling.  With lower back palpation, she felt more tingling on bilateral lower extremities, concerning for lumbosacral radiculopathy.  Otherwise neurologically exam intact, no focal deficit.  Patient stated that she had double vision on either eye, and with both eyes opening more blurry vision.  This is not a neurological issue but instead more ophthalmological condition, recommend outpatient follow-up with ophthalmology.  EEG  showed no seizure but one isolated epileptiform discharges embedded in the K complexes during stage II sleep maximized over the bilateral frontocentral head regions.  She had history of seizure and on Vimpat and Lamictal, no need to make any changes at this time.  She will follow-up with [facility] Vesta Mixer, NP to consider EMG/NCS study for radiculopathy.  Anita [name]/OT recommend outpatient Anita [name].  For detailed assessment and plan, please refer to above as I have made changes wherever appropriate.   Neurology will sign off. Please call with questions. Anita [name] will follow  up with stroke clinic NP Slack at [facility] in about 4 weeks. Thanks for the consult.   Rosalin Hawking, MD PhD Stroke Neurology 10/05/2021 4:35 PM     To contact Stroke Continuity provider, please refer to [email or web address] After hours, contact General Neurology

## 2021-10-05 NOTE — Discharge Summary (Signed)
[facility] Discharge Summary  Patient name: Anita [name] Medical record number: [id number] Date of birth: 1981/02/11 Age: 41 y.o. Gender: female Date of Admission: 10/04/2021  Date of Discharge: 10/05/2021 Admitting Physician: Dickie La, MD  Primary Care Provider: Dickie La, MD Consultants: Neurology  Indication for Hospitalization: Left sided weakness  Discharge Diagnoses/Problem List:  TIA Diplopia Weakness  Disposition: Home  Discharge Condition: Stable  Discharge Exam:   General: NAD, laying in bed comfortably Cardiovascular: RRR no m/r/g Respiratory: CTAB no w/r/c Abdomen: Nontender to palpation, soft Extremities: Sensation intact, strength 5/5 bilaterally.  Neuro:  CN III, IV,VI: EOMI CV V: Normal sensation in V1, V2, V3 CVII: Symmetric smile and brow raise CN VIII: Normal hearing CN IX,X: Symmetric palate raise  CN XI: 5/5 shoulder shrug CN XII: Symmetric tongue protrusion  UE and LE strength 5/5 Normal sensation in UE and LE bilaterally   Brief Hospital Course:  Anita [name] is a 41 y.o. female PMHx of seizures (last seizure 2013), anemia, anxiety, heart murmur presented to [facility] due to double vision, tingling in left arm, and left sided weakness  Left side weakness/pain   paresthesias NOS Code stroke was called and acute stroke was ruled out given negative Brain MRI and CT head. Carotid ultrasound was negative for thrombus or plaque. Echocardiogram showed EF of 60 to 65% and grade 1 diastolic dysfunction. EEG showed 1 isolated epileptiform discharge suggestive of an underlying foci of epileptogenic potential (has known history of seizures. TSH 1.227. Lipid panel with total 204, LDL 127. A1c was 5.0.  Next day patient complained of tingling in palms and soles bilaterally as well is back pain in her mid back.  Neurology recommended PT/OT and to consider nerve conduction study as outpatient.  They said that it was less likely to  be related to lacosamide given she has been on it since 2013. They did say to consider component of functional issue as well to her paresthesias.  Did not continue her on aspirin started in the hospital given she did not have a stroke.  Intermittent diplopia Patient had intermittent diplopia during hospitalization that was monocular.  Neurology recommended ophthalmology follow-up outpatient.  Extraocular movements were intact.  Seizure history Was continued on home medications of Vimpat and Lamictal.  Levels were pending at discharge but patient has been compliant.  Hyperlipidemia LDL was 127 and total cholesterol was 204.  ASCVD risk 0.3% did not start patient on statin in hospital.  Iron Deficiency Anemia Last ferritin of 7. Follows with hematology for thrombocytosis. Repeat ferritin was 22.  Started her on ferrous sulfate 325 mg every other day at discharge.   Issues for Follow Up:  Dr. Manuella Ghazi opthalmology information was provided on following up providers information Consider outpatient nerve conduction studies Started ferrous sulfate at discharge, follow up on ferritin level in 6 weeks    Significant Procedures: EEG  Significant Labs and Imaging:  Recent Labs  Lab 10/04/21 0920 10/04/21 0925 10/05/21 0254  WBC 5.9  --  7.5  HGB 14.7 15.3* 13.8  HCT 44.5 45.0 42.4  PLT 428*  --  431*   Recent Labs  Lab 10/04/21 0920 10/04/21 0925 10/05/21 0254  NA 137 139 136  K 3.6 3.9 4.1  CL 102 105 104  CO2 25  --  26  GLUCOSE 96 91 90  BUN 9 11 12   CREATININE 0.78 0.60 0.67  CALCIUM 9.6  --  9.0  ALKPHOS 56  --  46  AST 18  --  13*  ALT 15  --  12  ALBUMIN 4.4  --  4.1    Results/Tests Pending at Time of Discharge: lacosamide & vimpat  Discharge Medications:  Allergies as of 10/05/2021       Reactions   Dye Fdc Red [food Color Pink] Nausea And Vomiting   Flagyl [metronidazole Hcl]    GI upset        Medication List     TAKE these medications    ferrous  sulfate 325 (65 FE) MG tablet Take 1 tablet (325 mg total) by mouth every other day.   lacosamide 200 MG Tabs tablet Commonly known as: Vimpat Take 1 tablet (200 mg total) by mouth at bedtime.   lamoTRIgine 200 MG tablet Commonly known as: LAMICTAL Take 1.5 tablets (300 mg total) by mouth 2 (two) times daily.        Discharge Instructions: Please refer to Patient Instructions section of EMR for full details.  Patient was counseled important signs and symptoms that should prompt return to medical care, changes in medications, dietary instructions, activity restrictions, and follow up appointments.   Follow-Up Appointments:  Follow-up [place]. Go in 13 day(s).   Specialty: Family Medicine Why: 9:10 AM appointment (please arrive by 8:45 AM) Contact information: [address] [id number] [place] [address] [place], [place]. Schedule an appointment as soon as possible for a visit.   Specialty: Ophthalmology Why: with Dr. Armanda Magic information: [place] [address] [phone]         [place]. Go in 2 day(s).   Specialty: Sports Medicine Why: Appointment with Dr. Letha Cape information: [address] [id number] [place]                Gerrit Heck, MD 10/05/2021, 3:17 PM PGY-1, [place]

## 2021-10-05 NOTE — Progress Notes (Signed)
FPTS Brief Progress Note  S: Pt sleeping with movie playing.    O: BP 110/70 (BP Location: Right Arm)    Pulse 86    Temp 98.5 F (36.9 C) (Oral)    Resp 14    Ht 5\' 7"  (1.702 m)    Wt 85.3 kg    SpO2 98%    BMI 29.44 kg/m    GEN: sleeping  RESP: equal chest rise and fall    A/P: No change to plan. See AM progress notes.  - Orders reviewed. Labs for AM ordered, which was adjusted as needed.    Lyndee Hensen, DO 10/05/2021, 2:56 AM PGY-3, [facility] Night Resident  Please page [phone] with questions.

## 2021-10-05 NOTE — Evaluation (Signed)
Physical Therapy Evaluation and Discharge Patient Details Name: Anita [name] MRN: [id number] DOB: 1980/12/09 Today's Date: 10/05/2021  History of Present Illness  41 y.o. female presenting to ED 2/6 with LUE/LLE weakness and double vision. CT and MRI (-) for acute findings. EEG suggestive of an underlying foci of epileptogenic potential but no seizure activity noted. PMHx significant for seizures and L hearing loss.  Clinical Impression   Patient evaluated by Physical Therapy (per second order by Dr. Erlinda Hong) with no further acute PT needs identified. She demonstrated 5/5 bil leg strength and independent with all mobility. Scored 24/24 on Dynamic Gait Index. Does report occasional low back pain with tingling in lateral thighs. May benefit from OPPT for back pain. She plans to discuss with her PCP as she feels further testing may be indicated. See below for any follow-up Physical Therapy or equipment needs. PT is signing off. Thank you for this referral.        Recommendations for follow up therapy are one component of a multi-disciplinary discharge planning process, led by the attending physician.  Recommendations may be updated based on patient status, additional functional criteria and insurance authorization.  Follow Up Recommendations Outpatient PT (for low back pain)    Assistance Recommended at Discharge None  Patient can return home with the following       Equipment Recommendations None recommended by PT  Recommendations for Other Services       Functional Status Assessment Patient has not had a recent decline in their functional status     Precautions / Restrictions Precautions Precautions: None      Mobility  Bed Mobility Overal bed mobility: Independent                  Transfers Overall transfer level: Independent                      Ambulation/Gait Ambulation/Gait assistance: Independent Gait Distance (Feet): 400 Feet Assistive device:  None Gait Pattern/deviations: WFL(Within Functional Limits)          Stairs Stairs: Yes Stairs assistance: Independent Stair Management: No rails Number of Stairs: 2    Wheelchair Mobility    Modified Rankin (Stroke Patients Only)       Balance Overall balance assessment: Independent                               Standardized Balance Assessment Standardized Balance Assessment : Dynamic Gait Index   Dynamic Gait Index Level Surface: Normal Change in Gait Speed: Normal Gait with Horizontal Head Turns: Normal Gait with Vertical Head Turns: Normal Gait and Pivot Turn: Normal Step Over Obstacle: Normal Step Around Obstacles: Normal Steps: Normal Total Score: 24       Pertinent Vitals/Pain Pain Assessment Pain Assessment: No/denies pain    Home Living Family/patient expects to be discharged to:: Private residence Living Arrangements: Spouse/significant other;Children (4 y.o. twins) Available Help at Discharge: Family Type of Home: House Home Access: Level entry       Home Layout: One level        Prior Function Prior Level of Function : Independent/Modified Independent;Working/employed;Driving               ADLs Comments: Works as a Psychologist, sport and exercise. Enjoys spending time with her children.     Hand Dominance   Dominant Hand: Right    Extremity/Trunk Assessment   Upper Extremity Assessment Upper Extremity Assessment:  Overall [facility] for tasks assessed    Lower Extremity Assessment Lower Extremity Assessment: Overall WFL for tasks assessed (5/5 DF and quads bil; denied numbness initially; after forward flexion noted tingling rt outer thigh, but no loss of motor function)    Cervical / Trunk Assessment Cervical / Trunk Assessment: Normal  Communication   Communication: No difficulties  Cognition Arousal/Alertness: Awake/alert Behavior During Therapy: WFL for tasks assessed/performed Overall Cognitive Status: Within Functional  Limits for tasks assessed                                          General Comments      Exercises     Assessment/Plan    PT Assessment All further PT needs can be met in the next venue of [name]  PT Problem List Pain       PT Treatment Interventions      PT Goals (Current goals can be found in the [name] Plan section)  Acute Rehab PT Goals Patient Stated Goal: find out what is causing her back pain and leg tingling PT Goal Formulation: All assessment and education complete, DC therapy    Frequency       Co-evaluation               AM-PAC PT "6 Clicks" Mobility  Outcome Measure Help needed turning from your back to your side while in a flat bed without using bedrails?: None Help needed moving from lying on your back to sitting on the side of a flat bed without using bedrails?: None Help needed moving to and from a bed to a chair (including a wheelchair)?: None Help needed standing up from a chair using your arms (e.g., wheelchair or bedside chair)?: None Help needed to walk in hospital room?: None Help needed climbing 3-5 steps with a railing? : None 6 Click Score: 24    End of Session Equipment Utilized During Treatment: Gait belt Activity Tolerance: Patient tolerated treatment well Patient left: in bed;with call bell/phone within reach;with family/visitor present Nurse Communication: Mobility status;Other (comment) (no acute PT needs) PT Visit Diagnosis: Pain Pain - Right/Left:  (low back, bil) Pain - part of body:  (back)    Time: 7048-8891 PT Time Calculation (min) (ACUTE ONLY): 14 min   Charges:   PT Evaluation $PT Eval Low Complexity: [place], PT Acute Rehabilitation Services  Pager [phone] Office [phone]   Rexanne Mano 10/05/2021, 2:01 PM

## 2021-10-05 NOTE — Progress Notes (Signed)
PTA patient was living with her spouse and 2 42 y.o. twins and was grossly I with ADLs/IADLs and working as a Psychologist, sport and exercise. Patient currently functioning at baseline demonstrating observed ADLs with I. Patient does not require continued acute occupational therapy services with OT to sign off at this time.   10/05/21 0700  OT Visit Information  Last OT Received On 10/05/21  Assistance Needed +1  History of Present Illness 41 y.o. female presenting to ED 2/6 with LUE/LLE weakness and double vision. CT and MRI (-) for acute findings. EEG suggestive of an underlying foci of epileptogenic potential but no seizure activity noted. PMHx significant for seizures and L hearing loss.  Precautions  Precautions None  Home Living  Family/patient expects to be discharged to: Private residence  Living Arrangements Spouse/significant other;Children (4 y.o. 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  Communication  Communication No difficulties  Pain Assessment  Pain Assessment No/denies pain  Cognition  Arousal/Alertness Awake/alert  Behavior During Therapy WFL for tasks assessed/performed  Overall Cognitive Status Within Functional Limits for tasks assessed  Upper Extremity Assessment  Upper Extremity Assessment Overall WFL for tasks assessed  Lower Extremity Assessment  Lower Extremity Assessment Overall WFL for tasks assessed  Cervical / Trunk Assessment  Cervical / Trunk Assessment Normal  Vision- History  Baseline Vision/History 1 Wears glasses (at all times)  Ability to See in Adequate Light 0 Adequate  Patient Visual Report No change from baseline  Vision- Assessment  Vision Assessment? No apparent visual deficits  Additional Comments  Double vision present PTA has resolved.  Perception  Perception Tested? No  Praxis  Praxis tested? WFL  ADL  Overall ADL's  Independent  Bed Mobility  Overal bed mobility Independent  Transfers  Overall transfer level Independent  Balance  Overall balance assessment Independent  OT - End of Session  Equipment Utilized During Treatment Gait belt  Activity Tolerance Patient tolerated treatment well  Patient left in bed;with call bell/phone within reach  Nurse Communication Mobility status (Can be independent in room)  OT Assessment  OT Recommendation/Assessment Patient does not need any further OT services  OT Visit Diagnosis Unsteadiness on feet (R26.81)  OT Problem List Impaired balance (sitting and/or standing)  AM-PAC OT "6 Clicks" Daily Activity Outcome Measure (Version 2)  Help from another person eating meals? 4  Help from another person taking care of personal grooming? 4  Help from another person toileting, which includes using toliet, bedpan, or urinal? 4  Help from another person bathing (including washing, rinsing, drying)? 4  Help from another person to put on and taking off regular upper body clothing? 4  Help from another person to put on and taking off regular lower body clothing? 4  6 Click Score 24  Progressive Mobility  What is the highest level of mobility based on the progressive mobility assessment? Level 6 (Walks independently in room and hall) - Balance while walking in room without assist - Complete  Activity Ambulated independently in hallway  OT Recommendation  Follow Up Recommendations No OT follow up  Assistance recommended at discharge None  Functional Status Assessent Patient has not had a recent decline in their functional status  OT Equipment None recommended by OT  Acute Rehab OT Goals  Patient  Stated Goal To return home.  OT Goal Formulation With patient  OT Time Calculation  OT Start Time (ACUTE ONLY) 0705  OT Stop Time (ACUTE ONLY) 0717   OT Time Calculation (min) 12 min  OT General Charges  $OT Visit 1 Visit  OT Evaluation  $OT Eval Low Complexity 1 Low  Written Expression  Dominant Hand Right

## 2021-10-05 NOTE — Progress Notes (Signed)
PT Cancellation Note  Patient Details Name: Brayden Betters MRN: [id number] DOB: 19-Feb-1981   Cancelled Treatment:    Reason Eval/Treat Not Completed: PT screened, no needs identified, will sign off  Patient seen by OT earlier today and had no dizziness. Per OT, pt was independent with no need for PT evaluation. PT is signing off.    Arby Bar[place], PT Acute Rehabilitation Services  Pager [phone] Office [phone]  Rexanne Mano 10/05/2021, 11:19 AM

## 2021-10-06 ENCOUNTER — Encounter: Payer: Self-pay | Admitting: Family Medicine

## 2021-10-06 ENCOUNTER — Telehealth: Payer: Self-pay | Admitting: Neurology

## 2021-10-06 NOTE — Telephone Encounter (Signed)
Chart reviewed, patient is established at our office with history of seizures.  She was in the hospital yesterday, 10/05/21 for an episode of nerve pain in her back down her legs, diplopia.  She had weakness to the right side and diplopia which lasted for short while.    Nerve impingement in lower back Code Stroke CT head- No acute abnormality. ASPECTS 10.    MRI  No evidence of recent infarction, hemorrhage, or mass. Unremarkable vascular imaging. MRA-  All intracranial arteries patent, no significant stenosis or mass.  Carotid Doppler  no evidence of thrombus, dissection, plaque, or stenosis  2D Echo EF 60-65%, no regional wall abnormalities.  EEG- Abnormal awake and asleep routine EEG due to one isolated epileptiform discharge embedded in a K complex during stage II sleep maximal over the bilateral frontocentral head regions. 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 No seizures or evolving ictal patterns seen LDL 127 HgbA1c 5.0 VTE prophylaxis - SCDs Therapy recommendations:  pending Disposition:  Pending     Recommend outpatient follow-up with ophthalmology, this is not a neurological issue in regards to diplopia.  Consider nerve conduction for radiculopathy, PT/OT outpatient.  Please schedule the patient with me in the next few weeks.

## 2021-10-06 NOTE — Telephone Encounter (Signed)
I spoke to the patient. She has been rescheduled for first available with [place] (on 11/30/21). Reports having an appt with ophthalmology on 10/08/21.

## 2021-10-07 ENCOUNTER — Other Ambulatory Visit: Payer: Self-pay | Admitting: Family Medicine

## 2021-10-07 DIAGNOSIS — H4920 Sixth [abducent] nerve palsy, unspecified eye: Secondary | ICD-10-CM

## 2021-10-07 DIAGNOSIS — H532 Diplopia: Secondary | ICD-10-CM

## 2021-10-07 LAB — LACOSAMIDE: Lacosamide: 1.9 ug/mL — ABNORMAL LOW (ref 5.0–10.0)

## 2021-10-08 ENCOUNTER — Ambulatory Visit: Payer: Self-pay

## 2021-10-08 ENCOUNTER — Ambulatory Visit: Payer: BC Managed Care – PPO | Admitting: Family Medicine

## 2021-10-08 VITALS — BP 110/74 | Ht 66.5 in | Wt 188.0 lb

## 2021-10-08 DIAGNOSIS — M25512 Pain in left shoulder: Secondary | ICD-10-CM | POA: Diagnosis not present

## 2021-10-08 MED ORDER — METHYLPREDNISOLONE ACETATE 40 MG/ML IJ SUSP
40.0000 mg | Freq: Once | INTRAMUSCULAR | Status: AC
Start: 2021-10-08 — End: 2021-10-08
  Administered 2021-10-08: 40 mg via INTRA_ARTICULAR

## 2021-10-08 NOTE — Progress Notes (Signed)
PCP: Dickie La, MD  Subjective:   HPI Patient is a 41 y.o. female here for left shoulder pain and upper left extremity pain with some tingling in bicep area. Was recently in hospital with these sx as well as diplopia. Workup negative for acute stroke. Diplopia improving and referref to pthalmolgy. Shoulder and arm pain some better but not resolved. She is right handed. Pain is left posterior shoulder with some occasional parasthesias in LUE.   Past Medical History:  Diagnosis Date   Anemia    Anxiety    H/O: hysterectomy 07/07/2021   Heart murmur    Newborn product of in vitro fertilization (IVF) pregnancy    Seizures ([place])    Last seizure in 2013    BP 110/74    Ht 5' 6.5" (1.689 m)    Wt 188 lb (85.3 kg)    BMI 29.89 kg/m   [place] Adult Exercise 10/08/2021  Frequency of aerobic exercise (# of days/week) 0  Average time in minutes 0  Frequency of strengthening activities (# of days/week) 0    No flowsheet data found.      Objective:  Physical Exam:  Gen: Well-appearing, in no acute distress; non-toxic CV: Regular Rate. Well-perfused. Warm.  Resp: Breathing unlabored on room air; no wheezing. Psych: Fluid speech in conversation; appropriate affect; normal thought process Neuro: Sensation intact throughout. No gross coordination deficits.  MSK:  Ttp left psoterior shoulder muscles. Som pain with AROm in ABduction  MSK Complete US of Shoulder, Left   Patient was seated on exam table and shoulder US examination was performed using high frequency linear probe.  - biceps tendon was visualized within the bicipital groove in both longitudinal and transverse axis with no signs of fluid or hypoechoic changes. Tendon fibers intact without signs of irregularity.  -subscapularis tendon was visualized in both longitudinal and transverse axis with no signs of tear or tissue irregularity. - supraspinatus tendon was visualized in longitudinal, transverse, and interval  views. No signs of tearing or hypoechoic changes. There is a small calcification within the mid-portion of the tendon on the anterior side.  - infraspinatus tendon was visualized in both longitudinal and transverse axis without evidence of tearing or tendon irregularity. - The posterior glenohumeral joint was visualized with proper alignment.  There is a slight degree of thickening of the glenoid labrum. - the AC joint was visualized without bursal distension or significant bony spurs/arthritic changes.   IMPRESSION: Small calcification within the supraspinatus tendon, mild thickening of the glenoid labrum but otherwise, grossly normal complete U/S examination of the shoulder    Assessment & Plan:  1. Left shoulder pain with trigger points identified. HEp and f/u in 2-4 weeks.

## 2021-10-18 ENCOUNTER — Encounter: Payer: Self-pay | Admitting: Neurology

## 2021-10-20 ENCOUNTER — Other Ambulatory Visit: Payer: Self-pay

## 2021-10-20 ENCOUNTER — Encounter: Payer: Self-pay | Admitting: Family Medicine

## 2021-10-20 ENCOUNTER — Ambulatory Visit (INDEPENDENT_AMBULATORY_CARE_PROVIDER_SITE_OTHER): Payer: BC Managed Care – PPO | Admitting: Family Medicine

## 2021-10-20 DIAGNOSIS — H532 Diplopia: Secondary | ICD-10-CM | POA: Diagnosis not present

## 2021-10-20 NOTE — Assessment & Plan Note (Signed)
Diplopia seems to be resolved.  I still see a slight abnormality on her eye exam.  Given the fact that she is not having symptoms, this may or may not be clinically important.  I would given her history of acute episode of diplopia, I would recommend that she follow-up with ophthalmology just for an exam.

## 2021-10-20 NOTE — Progress Notes (Signed)
° ° °  CHIEF COMPLAINT / HPI: #1.  Follow-up hospitalization for diplopia and some strokelike symptoms.  Since discharge she has not had any recurrence.  She has had trouble getting appoint with ophthalmology.  No more blurred vision, no double vision, no eye pain, no eye sensitivity to light. 2.  Follow-up posterior shoulder pain status post trigger point injection.  She is 95% improved.  Been doing the exercises at home and they still are somewhat painful to do.  Wonders how long she needs to do them and whether or not this will recur.   PERTINENT  PMH / PSH: I have reviewed the patients medications, allergies, past medical and surgical history, smoking status and updated in the EMR as appropriate.   OBJECTIVE:  BP 125/83    Pulse 82    Wt 191 lb (86.6 kg)    LMP 05/28/2021    BMI 30.37 kg/m  GENERAL: Well-developed female no acute distress Shoulders: Symmetrical.  Both shoulders have full range of motion that is painless and she has intact strength that is 5 out of 5 in all planes the rotator cuff bilaterally.  The left shoulder is no longer tender to palpation over the levator scapula area.  She still has some mild spasm in the left trapezius and left posterior neck muscles but this is significantly decreased from previous exam. HEENT extraocular muscles are intact: She still has a slight lag of the right eye as she looks to the left but is not experiencing any diplopia with this.  The apparent lag in movement is very intermittent.  The rest of her extraocular muscles seem symmetrical and there movement is I ntact.  ASSESSMENT / PLAN:  shoulder pain significantly improved.  Continue home exercise program.  I  Diplopia Diplopia seems to be resolved.  I still see a slight abnormality on her eye exam.  Given the fact that she is not having symptoms, this may or may not be clinically important.  I would given her history of acute episode of diplopia, I would recommend that she follow-up with  ophthalmology just for an exam.     Dorcas Mcmurray MD

## 2021-10-27 ENCOUNTER — Other Ambulatory Visit (HOSPITAL_COMMUNITY): Payer: Self-pay

## 2021-10-29 ENCOUNTER — Ambulatory Visit: Payer: BC Managed Care – PPO | Admitting: Family Medicine

## 2021-11-05 ENCOUNTER — Encounter: Payer: Self-pay | Admitting: Family Medicine

## 2021-11-05 ENCOUNTER — Other Ambulatory Visit (HOSPITAL_COMMUNITY): Payer: Self-pay

## 2021-11-05 ENCOUNTER — Other Ambulatory Visit: Payer: Self-pay | Admitting: Neurology

## 2021-11-08 ENCOUNTER — Other Ambulatory Visit (HOSPITAL_COMMUNITY): Payer: Self-pay

## 2021-11-08 NOTE — Telephone Encounter (Signed)
See below from referral coordinator.  ? ?[name] is unable to see patient until July and august.  Will wait to discuss with pcp on next steps.  Jazmin Hartsell,CMA ? ?Unsure what else could be done for patient at this time.  ? ?Will forward to PCP.  ?

## 2021-11-09 ENCOUNTER — Encounter: Payer: Self-pay | Admitting: Neurology

## 2021-11-10 ENCOUNTER — Other Ambulatory Visit (HOSPITAL_COMMUNITY): Payer: Self-pay

## 2021-11-10 MED ORDER — LACOSAMIDE 200 MG PO TABS
200.0000 mg | ORAL_TABLET | Freq: Every day | ORAL | 1 refills | Status: DC
Start: 2021-11-10 — End: 2021-11-30
  Filled 2021-11-10: qty 30, 30d supply, fill #0

## 2021-11-29 NOTE — Progress Notes (Signed)
? ? ?PATIENT: Anita [name] ?DOB: 1980-08-31 ? ?REASON FOR VISIT: follow up for seizures, ER visit for diplopia, left sided weakness  ?HISTORY FROM: patient ?PRIMARY NEUROLOGIST: Dr. Krista Blue  ? ?HISTORY OF PRESENT ILLNESS: ?Today 11/30/21  ?Anita [name] here today for early follow-up. In the ER 10/05/21 for an episode of nerve pain in her back down her legs, diplopia, weakness to the left side, diplopia lasted for short while. MRI showed no acute problem.  MRA unremarkable.  Carotid Doppler no abnormality.  2D echo EF 60 to 65%.  EEG 1 isolated epileptiform discharge during sleep, suggestive of underlying foci of epileptogenic potential.  No seizures were seen.  LDL 127, A1c 5.  Recommended outpatient follow-up with ophthalmology, consider nerve conduction for radiculopathy, PT/OT. Symptoms started 2 days before aching to the left arm, Sunday had double vision in the AM, lasted 1 hour, then went away. Sometimes if she takes Lamictal in morning then increase activity too quickly will get double vision. The next day Monday, got up, had double vision, left arm was tingling, then went to work got out of car, a lot of trouble walking with double vision, dizzy, dropped what she was holding in left arm, fell over. Went to ER. Now symptoms resolved, no back pain, no double vision. Still hasn't seen eye doctor. In ER 10/04/21 Vimpat 1.9, Lamictal 14.1. Last seizure was 2016. Claims the weakness lasted about 1 week then went away. Is totally at baseline now.  ? ?08/04/2021 SS: Anita [name] here today for follow-up. Recovering from hysterectomy, going back to work Dec 21. Doing well on generic Vimpat, Lamictal. Not interested in reducing dose of seizure medications. Last seizure was in 2016, feels since then, and before memory isn't as sharp, forgets names, feels fatigued. Has family history of memory disease.MRI of the brain with and without contrast was normal in August 2017. Has twins, who are 4.  ? ?Update 07/29/2020 SS: Anita [name] is a  41 year old female with history of seizure disorder.  Her last seizure was in May 2016 after stopping clonazepam abruptly.  Since last seen, we have switched her to from brand to generic Lamictal 300 mg twice a day (since August/September time period).  She remains on Vimpat 200 mg at bedtime.  Has done well on the generic Lamictal.  No recurrent seizures.  Desires to continue on seizure medication, not try to taper.  Works full-time as a Psychologist, sport and exercise.  She has twins, almost 3.  Saw hematology due to elevated platelets, no significant abnormalities found.  Is taking multivitamin now.  Presents today for evaluation unaccompanied. ? ?HISTORY  ?07/31/2019 SS: Anita [name] is a 41 year old female with history of seizure disorder.  Her last seizure occurred in May 2016 after stopping Klonopin abruptly. She is currently taking Vimpat 200 mg at bedtime, Lamictal brand name, 300 mg twice a day.  She has not had recurrent seizure.  She works full-time as a Psychologist, sport and exercise.  She has twins who are 2.  She is tolerating medications without side effect.  She requires brand name Vimpat and Lamictal.  She denies any new problems or concerns.  She presents for follow-up unaccompanied ? ?REVIEW OF SYSTEMS: Out of a complete 14 system review of symptoms, the patient complains only of the following symptoms, and all other reviewed systems are negative. ? ?See HPI ? ?ALLERGIES: ?Allergies  ?Allergen Reactions  ? Dye Fdc Red [Food Color Pink] Nausea And Vomiting  ? Flagyl [Metronidazole Hcl]   ?  GI  upset  ? ? ?HOME MEDICATIONS: ?Outpatient Medications Prior to Visit  ?Medication Sig Dispense Refill  ? lacosamide (VIMPAT) 200 MG TABS tablet Take 1 tablet (200 mg total) by mouth at bedtime. 90 tablet 1  ? lamoTRIgine (LAMICTAL) 200 MG tablet Take 1.5 tablets (300 mg total) by mouth 2 (two) times daily. 270 tablet 4  ? ferrous sulfate 325 (65 FE) MG tablet Take 1 tablet (325 mg total) by mouth every other day. 30 tablet 0  ? ?No  facility-administered medications prior to visit.  ? ? ?PAST MEDICAL HISTORY: ?Past Medical History:  ?Diagnosis Date  ? Anemia   ? Anxiety   ? H/O: hysterectomy 07/07/2021  ? Heart murmur   ? Newborn product of in vitro fertilization (IVF) pregnancy   ? Seizures ([place])   ? Last seizure in 2013  ? ? ?PAST SURGICAL HISTORY: ?Past Surgical History:  ?Procedure Laterality Date  ? arm surgery    ? CESAREAN SECTION MULTI-GESTATIONAL WITH TUBAL N/A 08/25/2017  ? Procedure: CESAREAN SECTION MULTI-GESTATIONAL WITH TUBAL;  Surgeon: Louretta Shorten, MD;  Location: [facility];  Service: Obstetrics;  Laterality: N/A;  Primary ?edc 09/08/17 ?need RNFA  ? HYSTEROSCOPY WITH D & C N/A 10/09/2015  ? Procedure: DILATATION AND CURETTAGE /HYSTEROSCOPY ;  Surgeon: Louretta Shorten, MD;  Location: [facility];  Service: Gynecology;  Laterality: N/A;  ? LAPAROSCOPY N/A 10/09/2015  ? Procedure: LAPAROSCOPY DIAGNOSTIC with CO 2 laser  WITH CHROMOPERTUBATION AND LASER OF ENDOMETRIOSIS WITH LYSIS OF ADHESIONS;  Surgeon: Louretta Shorten, MD;  Location: [facility];  Service: Gynecology;  Laterality: N/A;  ? ? ?FAMILY HISTORY: ?Family History  ?Problem Relation Age of Onset  ? Healthy Mother   ? Healthy Father   ? Heart disease Maternal Grandmother   ? Bone cancer Maternal Grandfather   ? ? ?SOCIAL HISTORY: ?Social History  ? ?Socioeconomic History  ? Marital status: Married  ?  Spouse name: Not on file  ? Number of children: 0  ? Years of education: college  ? Highest education level: Not on file  ?Occupational History  ?  Employer: [employer]  ?Tobacco Use  ? Smoking status: Never  ? Smokeless tobacco: Never  ?Substance and Sexual Activity  ? Alcohol use: No  ? Drug use: No  ? Sexual activity: Yes  ?  Birth control/protection: Surgical  ?Other Topics Concern  ? Not on file  ?Social History Narrative  ? Patient lives at home with her boyfriend. Patient works full time at [employer].  ? Blue  ? Right handed.  ? Caffeine- coffee four times a week.  ?  Trying to get pregnant.  05-13-15  ? ?Social Determinants of Health  ? ?Financial Resource Strain: Not on file  ?Food Insecurity: Not on file  ?Transportation Needs: Not on file  ?Physical Activity: Not on file  ?Stress: Not on file  ?Social Connections: Not on file  ?Intimate Partner Violence: Not on file  ? ?PHYSICAL EXAM ? ?Vitals:  ? 11/30/21 0846  ?BP: 115/77  ?Pulse: 88  ?Weight: 186 lb (84.4 kg)  ?Height: '5\' 7"'$  (1.702 m)  ? ? ? ?Body mass index is 29.13 kg/m?. ? ?Generalized: Well developed, in no acute distress  ? ?Neurological examination  ?Mentation: Alert oriented to time, place, history taking. Follows all commands speech and language fluent ?Cranial nerve II-XII: Pupils were equal round reactive to light. Extraocular movements were full, visual field were full on confrontational test. Facial sensation and strength were normal.  Head turning and shoulder shrug  were normal and symmetric. ?Motor: The motor testing reveals 5 over 5 strength of all 4 extremities. Good symmetric motor tone is noted throughout.  ?Sensory: Sensory testing is intact to soft touch on all 4 extremities. No evidence of extinction is noted.  ?Coordination: Cerebellar testing reveals good finger-nose-finger and heel-to-shin bilaterally.  ?Gait and station: Gait is normal. Tandem gait is normal. ?Reflexes: Deep tendon reflexes are symmetric and normal bilaterally.  ? ?DIAGNOSTIC DATA (LABS, IMAGING, TESTING) ?- I reviewed patient records, labs, notes, testing and imaging myself where available. ? ?Lab Results  ?Component Value Date  ? WBC 7.5 10/05/2021  ? HGB 13.8 10/05/2021  ? HCT 42.4 10/05/2021  ? MCV 90.6 10/05/2021  ? PLT 431 (H) 10/05/2021  ? ?   ?Component Value Date/Time  ? NA 136 10/05/2021 0254  ? NA 140 08/04/2021 0000  ? K 4.1 10/05/2021 0254  ? CL 104 10/05/2021 0254  ? CO2 26 10/05/2021 0254  ? GLUCOSE 90 10/05/2021 0254  ? BUN 12 10/05/2021 0254  ? BUN 8 08/04/2021 0000  ? CREATININE 0.67 10/05/2021 0254  ? CREATININE  0.77 09/23/2019 1138  ? CREATININE 0.67 05/20/2014 1009  ? CALCIUM 9.0 10/05/2021 0254  ? PROT 7.3 10/05/2021 0254  ? PROT 7.4 08/04/2021 0000  ? ALBUMIN 4.1 10/05/2021 0254  ? ALBUMIN 4.6 08/04/2021 0000  ?

## 2021-11-30 ENCOUNTER — Ambulatory Visit (INDEPENDENT_AMBULATORY_CARE_PROVIDER_SITE_OTHER): Payer: BC Managed Care – PPO | Admitting: Neurology

## 2021-11-30 ENCOUNTER — Encounter: Payer: Self-pay | Admitting: Neurology

## 2021-11-30 ENCOUNTER — Telehealth: Payer: Self-pay | Admitting: Neurology

## 2021-11-30 VITALS — BP 115/77 | HR 88 | Ht 67.0 in | Wt 186.0 lb

## 2021-11-30 DIAGNOSIS — H532 Diplopia: Secondary | ICD-10-CM

## 2021-11-30 DIAGNOSIS — G40309 Generalized idiopathic epilepsy and epileptic syndromes, not intractable, without status epilepticus: Secondary | ICD-10-CM

## 2021-11-30 MED ORDER — LACOSAMIDE 200 MG PO TABS: 200.0000 mg | ORAL_TABLET | Freq: Every day | ORAL | 1 refills | Status: DC

## 2021-11-30 MED ORDER — LAMOTRIGINE 200 MG PO TABS: 300.0000 mg | ORAL_TABLET | Freq: Two times a day (BID) | ORAL | 4 refills | Status: DC

## 2021-11-30 NOTE — Patient Instructions (Signed)
Referral to Dr. Katy Fitch ?Continue current medications ?Call for any problems  ?See you back in 6 months  ?

## 2021-11-30 NOTE — Telephone Encounter (Signed)
Sent to Dr. Groat ph # [phone] 

## 2021-12-01 NOTE — Progress Notes (Signed)
Chart reviewed, agree above plan ?

## 2021-12-02 ENCOUNTER — Encounter: Payer: Self-pay | Admitting: Neurology

## 2021-12-03 ENCOUNTER — Encounter: Payer: Self-pay | Admitting: Neurology

## 2021-12-10 ENCOUNTER — Other Ambulatory Visit (HOSPITAL_COMMUNITY): Payer: Self-pay

## 2022-01-04 ENCOUNTER — Encounter: Payer: Self-pay | Admitting: Family Medicine

## 2022-01-07 ENCOUNTER — Ambulatory Visit: Payer: BC Managed Care – PPO | Admitting: Family Medicine

## 2022-01-07 ENCOUNTER — Encounter: Payer: Self-pay | Admitting: Family Medicine

## 2022-01-07 VITALS — BP 122/78 | Ht 66.5 in | Wt 189.0 lb

## 2022-01-07 DIAGNOSIS — M25512 Pain in left shoulder: Secondary | ICD-10-CM | POA: Insufficient documentation

## 2022-01-07 MED ORDER — CYCLOBENZAPRINE HCL 5 MG PO TABS: ORAL_TABLET | ORAL | 1 refills | Status: DC

## 2022-01-07 MED ORDER — METHYLPREDNISOLONE ACETATE 40 MG/ML IJ SUSP
40.0000 mg | Freq: Once | INTRAMUSCULAR | Status: AC
  Administered 2022-01-07: 40 mg via INTRA_ARTICULAR

## 2022-01-07 NOTE — Assessment & Plan Note (Signed)
Recurrence of left shoulder spasm. Trigger point injection. Start scapular retraction/shoulder shrug exercise as we discussed. Try night time muscle relaxer as needed. Fu if not improving. ?

## 2022-01-07 NOTE — Progress Notes (Signed)
?  Maat Kafer - 41 y.o. female MRN [id number]  Date of birth: 12/17/1980 ? ? ? ?SUBJECTIVE:    ?  ?Chief Complaint:/ HPI:  ?Recurrence of left shoulder pain and spasm.  His already started causing some neck pain as well and she is afraid it will trigger headache that she had previously with similar symptoms.  Is under a lot of stress at work with Dr. Nori Riis about.  He thinks no numbness in her hand. ? ? ? ?OBJECTIVE: BP 122/78   Ht 5' 6.5" (1.689 m)   Wt 189 lb (85.7 kg)   LMP 05/28/2021   BMI 30.05 kg/m?   ?Physical Exam:  Vital signs are reviewed. ?General: Well-developed female no acute distress ?SHOULDERS: Symmetrical. ?With left trapezius has significant amount of spasm and she has 3 areas of trigger point located in the trapezius, insertion of the levator scapula and they like rhomboid insertion.  This reproduces her pain. ? ? ?PROCEDURE: INJECTION: ?Patient was given informed consent, signed copy in the chart. Appropriate time out was taken. Area prepped and draped in usual sterile fashion. Ethyl chloride was  used for local anesthesia. A 21 gauge 1 1/2 inch needle was used.. 1 cc of methylprednisolone 40 mg/ml plus  3cc of 1% lidocaine without epinephrine was injected into the 3 trigger point areas of left scapula described in HPI using a(n) perpendicular approach.  ? ?The patient tolerated the procedure well. There were no complications. Post procedure instructions were given. ? ?ASSESSMENT & PLAN: ? ?See problem based charting & AVS for pt instructions. ?Left shoulder pain ?Recurrence of left shoulder spasm. Trigger point injection. Start scapular retraction/shoulder shrug exercise as we discussed. Try night time muscle relaxer as needed. Fu if not improving. ? ?

## 2022-01-14 ENCOUNTER — Ambulatory Visit: Payer: BC Managed Care – PPO | Admitting: Family Medicine

## 2022-02-01 ENCOUNTER — Encounter: Payer: Self-pay | Admitting: *Deleted

## 2022-04-08 ENCOUNTER — Telehealth: Payer: Self-pay | Admitting: *Deleted

## 2022-04-08 ENCOUNTER — Encounter: Payer: Self-pay | Admitting: Family Medicine

## 2022-04-08 DIAGNOSIS — M25512 Pain in left shoulder: Secondary | ICD-10-CM

## 2022-04-08 NOTE — Telephone Encounter (Signed)
Dr. Nori Riis  Is there any way i can get flexiril sent into pharmacy. my shoulder is starting to act up again. not to the extent of injection, but it is starting to get tense again. and hard to sleep because my back spasms and neck and shoulders get tight.  And is there by any chance i gat get the dose up a little, at times I was having to take 2 because of pain. if not i do understand.   Thank you  -Anita [name]

## 2022-04-12 ENCOUNTER — Other Ambulatory Visit (HOSPITAL_COMMUNITY): Payer: Self-pay

## 2022-04-12 MED ORDER — CYCLOBENZAPRINE HCL 5 MG PO TABS
2.5000 mg | ORAL_TABLET | Freq: Every evening | ORAL | 1 refills | Status: DC | PRN
  Filled 2022-04-12: qty 30, 30d supply, fill #0
  Filled 2022-05-23: qty 30, 30d supply, fill #1

## 2022-04-19 ENCOUNTER — Other Ambulatory Visit (HOSPITAL_COMMUNITY): Payer: Self-pay

## 2022-04-19 ENCOUNTER — Encounter: Payer: Self-pay | Admitting: Neurology

## 2022-04-19 MED ORDER — LACOSAMIDE 200 MG PO TABS
200.0000 mg | ORAL_TABLET | Freq: Every day | ORAL | 0 refills | Status: DC
Start: 2022-04-19 — End: 2022-05-17
  Filled 2022-04-19 (×2): qty 30, 30d supply, fill #0

## 2022-04-19 NOTE — Telephone Encounter (Signed)
Pt is asking for an emergency fill of Vimpat due to bottel spill at home. No refills remain at the pharmacy.   She understands she may have to pay out of pock for this med.

## 2022-05-17 ENCOUNTER — Other Ambulatory Visit: Payer: Self-pay | Admitting: Neurology

## 2022-05-19 ENCOUNTER — Encounter: Payer: Self-pay | Admitting: Neurology

## 2022-05-19 ENCOUNTER — Other Ambulatory Visit (HOSPITAL_COMMUNITY): Payer: Self-pay

## 2022-05-19 MED ORDER — LACOSAMIDE 200 MG PO TABS
200.0000 mg | ORAL_TABLET | Freq: Every day | ORAL | 0 refills | Status: DC
Start: 2022-05-19 — End: 2022-06-27
  Filled 2022-05-19: qty 30, 30d supply, fill #0

## 2022-05-20 ENCOUNTER — Encounter: Payer: Self-pay | Admitting: Family Medicine

## 2022-05-20 ENCOUNTER — Other Ambulatory Visit: Payer: Self-pay | Admitting: *Deleted

## 2022-05-20 DIAGNOSIS — H532 Diplopia: Secondary | ICD-10-CM

## 2022-05-20 DIAGNOSIS — G40309 Generalized idiopathic epilepsy and epileptic syndromes, not intractable, without status epilepticus: Secondary | ICD-10-CM

## 2022-05-23 ENCOUNTER — Other Ambulatory Visit (HOSPITAL_COMMUNITY): Payer: Self-pay

## 2022-06-02 ENCOUNTER — Ambulatory Visit: Payer: BC Managed Care – PPO | Admitting: Neurology

## 2022-06-14 ENCOUNTER — Encounter: Payer: Self-pay | Admitting: Family Medicine

## 2022-06-17 ENCOUNTER — Ambulatory Visit: Payer: BC Managed Care – PPO | Admitting: Family Medicine

## 2022-06-17 VITALS — BP 126/80 | Ht 67.0 in

## 2022-06-17 DIAGNOSIS — M7061 Trochanteric bursitis, right hip: Secondary | ICD-10-CM | POA: Diagnosis not present

## 2022-06-17 MED ORDER — METHYLPREDNISOLONE ACETATE 40 MG/ML IJ SUSP
40.0000 mg | Freq: Once | INTRAMUSCULAR | Status: AC
  Administered 2022-06-17: 40 mg via INTRA_ARTICULAR

## 2022-06-19 ENCOUNTER — Encounter: Payer: Self-pay | Admitting: Family Medicine

## 2022-06-19 DIAGNOSIS — M7061 Trochanteric bursitis, right hip: Secondary | ICD-10-CM | POA: Insufficient documentation

## 2022-06-19 NOTE — Progress Notes (Signed)
  Anita [name] - 41 y.o. female MRN [id number]  Date of birth: [birth year]    SUBJECTIVE:      Chief Complaint:/ HPI:  Right hip pain: Been bothering her about 4 to 6 weeks.  Does not seem to be getting any better.  Cannot lay on that side at night because it is painful.  Climbing steps is problematic as it is standing for long periods of time.  She feels like she is done something to sprain it but does not really know what that would be.  Has had this problem once before several years ago and had injection which seemed to resolve it at the time.  Would like to pursue injection today.    OBJECTIVE: BP 126/80   Ht '5\' 7"'$  (1.702 m)   LMP 05/28/2021   BMI 29.60 kg/m   Physical Exam:  Vital signs are reviewed. GENERAL: Well-developed female no acute distress hips: Internal and external rotation is full and painless bilaterally.  Flexion extension strength and range of motion is normal bilaterally.  She is tender to palpation along the area of the greater trochanter on the right hip.  Palpation here reproduces her pain.  She also has some pain with FABER.  PROCEDURE: INJECTION: Patient was given informed consent, signed copy in the chart. Appropriate time out was taken. Area prepped and draped in usual sterile fashion. Ethyl chloride was  used for local anesthesia. A 21 gauge 1 1/2 inch needle was used..  1 cc of methylprednisolone 40 mg/ml plus 4  cc of 1% lidocaine without epinephrine was injected into the area of the right hip greater trochanteric bursa using a(n) lateral approach.   The patient tolerated the procedure well. There were no complications. Post procedure instructions were given.   ASSESSMENT & PLAN:  See problem based charting & AVS for pt instructions. Greater trochanteric bursitis of right hip Corticosteroid injection today.  Home exercise plan.  Follow-up if not significantly improved in 3 to 4 weeks.  Red flags discussed.

## 2022-06-19 NOTE — Assessment & Plan Note (Signed)
Corticosteroid injection today.  Home exercise plan.  Follow-up if not significantly improved in 3 to 4 weeks.  Red flags discussed.

## 2022-06-20 ENCOUNTER — Encounter: Payer: Self-pay | Admitting: Neurology

## 2022-06-21 ENCOUNTER — Ambulatory Visit: Payer: BC Managed Care – PPO | Admitting: Neurology

## 2022-06-27 ENCOUNTER — Other Ambulatory Visit (HOSPITAL_COMMUNITY): Payer: Self-pay

## 2022-06-27 ENCOUNTER — Other Ambulatory Visit: Payer: Self-pay | Admitting: Neurology

## 2022-06-28 ENCOUNTER — Other Ambulatory Visit (HOSPITAL_COMMUNITY): Payer: Self-pay

## 2022-06-29 ENCOUNTER — Other Ambulatory Visit (HOSPITAL_COMMUNITY): Payer: Self-pay

## 2022-06-29 MED ORDER — LACOSAMIDE 200 MG PO TABS
200.0000 mg | ORAL_TABLET | Freq: Every day | ORAL | 1 refills | Status: DC
  Filled 2022-06-29: qty 30, 30d supply, fill #0
  Filled 2022-08-08: qty 30, 30d supply, fill #1
  Filled 2022-09-14: qty 30, 30d supply, fill #2
  Filled 2022-10-24: qty 30, 30d supply, fill #3
  Filled 2022-12-02: qty 30, 30d supply, fill #4
  Filled 2023-01-03: qty 30, 30d supply, fill #5

## 2022-06-29 NOTE — Telephone Encounter (Signed)
[place] drug registry shows last refill 04/29/2022 # 30 for a 30 day supply.   Pt due for refill.

## 2022-07-01 ENCOUNTER — Encounter: Payer: Self-pay | Admitting: Neurology

## 2022-07-15 ENCOUNTER — Encounter (HOSPITAL_BASED_OUTPATIENT_CLINIC_OR_DEPARTMENT_OTHER): Payer: Self-pay | Admitting: Emergency Medicine

## 2022-07-15 ENCOUNTER — Emergency Department (HOSPITAL_BASED_OUTPATIENT_CLINIC_OR_DEPARTMENT_OTHER)
Admission: EM | Admit: 2022-07-15 | Discharge: 2022-07-15 | Disposition: A | Discharge status: Home / Self Care | Payer: BC Managed Care – PPO | Attending: Emergency Medicine | Admitting: Emergency Medicine

## 2022-07-15 ENCOUNTER — Emergency Department (HOSPITAL_BASED_OUTPATIENT_CLINIC_OR_DEPARTMENT_OTHER): Payer: BC Managed Care – PPO | Admitting: Radiology

## 2022-07-15 ENCOUNTER — Other Ambulatory Visit: Payer: Self-pay

## 2022-07-15 DIAGNOSIS — R059 Cough, unspecified: Secondary | ICD-10-CM | POA: Diagnosis present

## 2022-07-15 DIAGNOSIS — J208 Acute bronchitis due to other specified organisms: Secondary | ICD-10-CM

## 2022-07-15 DIAGNOSIS — J069 Acute upper respiratory infection, unspecified: Secondary | ICD-10-CM | POA: Diagnosis not present

## 2022-07-15 DIAGNOSIS — Z1152 Encounter for screening for COVID-19: Secondary | ICD-10-CM | POA: Insufficient documentation

## 2022-07-15 DIAGNOSIS — J209 Acute bronchitis, unspecified: Secondary | ICD-10-CM | POA: Insufficient documentation

## 2022-07-15 LAB — RESP PANEL BY RT-PCR (RSV, FLU A&B, COVID)  RVPGX2
Influenza A by PCR: NEGATIVE
Influenza B by PCR: NEGATIVE
Resp Syncytial Virus by PCR: NEGATIVE
SARS Coronavirus 2 by RT PCR: NEGATIVE

## 2022-07-15 NOTE — ED Notes (Signed)
RT note: Placed call bell within reach of pt. due to hx. of Epilepsy seen during RT assessment, pt. stated understanding.

## 2022-07-15 NOTE — ED Triage Notes (Signed)
Pt arrivves pov, steady gait, endorses exposure to RSV x 5 days pta, reports cough, green nasal and phlegm production. Also c/o "lungs burning" bilaterally, increases with cough.

## 2022-07-15 NOTE — Discharge Instructions (Signed)
You appear to have an upper respiratory infection (URI). An upper respiratory tract infection, or cold, is a viral infection of the air passages leading to the lungs. IIt cannot be cured by antibiotics or other medicines. RETURN IMMEDIATELY IF you develop shortness of breath, confusion or altered mental status, a new rash, become dizzy, faint, or poorly responsive, begin running high fevers or are unable to be cared for at home.

## 2022-07-15 NOTE — ED Notes (Signed)
Discharge instructions and follow up care reviewed and explained, pt verbalized understanding and had no further questions on d/c. Pt caox4, ambulatory, and in no obvious distress on d/c.

## 2022-07-15 NOTE — ED Provider Notes (Signed)
[facility] EMERGENCY DEPT Provider Note   CSN: [id number] Arrival date & time: 07/15/22  0751     History  Chief Complaint  Patient presents with   Cough    Anita [name] is a 41 y.o. female.  The history is provided by the patient. No language interpreter was used.  Cough Severity:  Moderate Onset quality:  Gradual Chronicity:  New Context: sick contacts (RSV at work)   Worsened by:  Deep breathing Associated symptoms: chest pain (w/cough)        Home Medications Prior to Admission medications   Medication Sig Start Date End Date Taking? Authorizing Provider  cyclobenzaprine (FLEXERIL) 5 MG tablet Take 0.5-1 tablets (2.5-5 mg total) by mouth at bedtime as needed for muscle spasm in the back 04/12/22   Dickie La, MD  lacosamide (VIMPAT) 200 MG TABS tablet Take 1 tablet (200 mg total) by mouth at bedtime. 06/29/22   Suzzanne Cloud, NP  lamoTRIgine (LAMICTAL) 200 MG tablet Take 1.5 tablets (300 mg total) by mouth 2 (two) times daily. 11/30/21   Suzzanne Cloud, NP      Allergies    Dye fdc red [food color pink] and Flagyl [metronidazole hcl]    Review of Systems   Review of Systems  Respiratory:  Positive for cough.   Cardiovascular:  Positive for chest pain (w/cough).    Physical Exam Updated Vital Signs BP 115/79   Pulse 78   Temp 98.2 F (36.8 C)   Resp 18   Ht '5\' 7"'$  (1.702 m)   Wt 87.5 kg   LMP 05/28/2021   SpO2 100%   BMI 30.23 kg/m  Physical Exam Vitals and nursing note reviewed.  Constitutional:      General: She is not in acute distress.    Appearance: She is well-developed. She is not diaphoretic.  HENT:     Head: Normocephalic and atraumatic.     Right Ear: External ear normal.     Left Ear: External ear normal.     Nose: Nose normal.     Mouth/Throat:     Mouth: Mucous membranes are moist.  Eyes:     General: No scleral icterus.    Conjunctiva/sclera: Conjunctivae normal.  Cardiovascular:     Rate and Rhythm: Normal  rate and regular rhythm.     Heart sounds: Normal heart sounds. No murmur heard.    No friction rub. No gallop.  Pulmonary:     Effort: Pulmonary effort is normal. No respiratory distress.     Breath sounds: Normal breath sounds. No wheezing.  Abdominal:     General: Bowel sounds are normal. There is no distension.     Palpations: Abdomen is soft. There is no mass.     Tenderness: There is no abdominal tenderness. There is no guarding.  Musculoskeletal:     Cervical back: Normal range of motion.  Skin:    General: Skin is warm and dry.  Neurological:     Mental Status: She is alert and oriented to person, place, and time.  Psychiatric:        Behavior: Behavior normal.     ED Results / Procedures / Treatments   Labs (all labs ordered are listed, but only abnormal results are displayed) Labs Reviewed  RESP PANEL BY RT-PCR (RSV, FLU A&B, COVID)  [id number]    EKG None  Radiology No results found.  Procedures Procedures    Medications Ordered in ED Medications - No data to display  ED Course/ Medical Decision Making/ A&P Clinical Course as of 07/15/22 0840  Fri Jul 15, 2022  0839 DG Chest 2 View I visualized and interpreted two-view chest x-ray which shows no acute abnormalities.  Will defer to radiologic interpretation [AH]    Clinical Course User Index [AH] Margarita Mail, PA-C                           Medical Decision Making Pt CXR negative for acute infiltrate. Patients symptoms are consistent with URI, likely viral etiology. Discussed that antibiotics are not indicated for viral infections. Pt will be discharged with symptomatic treatment.  Verbalizes understanding and is agreeable with plan. Pt is hemodynamically stable & in NAD prior to dc.   Amount and/or Complexity of Data Reviewed Labs: ordered. Decision-making details documented in ED Course.    Details: Patient's respiratory panel negative for COVID, flu or RSV Radiology: ordered and independent  interpretation performed. Decision-making details documented in ED Course.    Details: I visualized and interpreted two-view chest x-ray which shows no acute findings         Final Clinical Impression(s) / ED Diagnoses Final diagnoses:  None    Rx / DC Orders ED Discharge Orders     None         Margarita Mail, PA-C 07/15/22 0954    Hayden Rasmussen, MD 07/15/22 1730

## 2022-08-08 ENCOUNTER — Other Ambulatory Visit (HOSPITAL_COMMUNITY): Payer: Self-pay

## 2022-08-08 ENCOUNTER — Other Ambulatory Visit: Payer: Self-pay | Admitting: Family Medicine

## 2022-08-08 DIAGNOSIS — M25512 Pain in left shoulder: Secondary | ICD-10-CM

## 2022-08-08 MED ORDER — CYCLOBENZAPRINE HCL 5 MG PO TABS
2.5000 mg | ORAL_TABLET | Freq: Every evening | ORAL | 1 refills | Status: DC | PRN
  Filled 2022-08-08: qty 30, 30d supply, fill #0
  Filled 2022-09-14: qty 30, 30d supply, fill #1

## 2022-08-10 ENCOUNTER — Ambulatory Visit: Payer: No Typology Code available for payment source | Admitting: Neurology

## 2022-08-15 ENCOUNTER — Encounter: Payer: Self-pay | Admitting: Family Medicine

## 2022-09-29 ENCOUNTER — Encounter: Payer: Self-pay | Admitting: Family Medicine

## 2022-09-30 ENCOUNTER — Other Ambulatory Visit: Payer: Self-pay

## 2022-09-30 MED ORDER — AMOXICILLIN-POT CLAVULANATE 875-125 MG PO TABS
1.0000 | ORAL_TABLET | Freq: Two times a day (BID) | ORAL | 0 refills | Status: DC
  Filled 2022-09-30: qty 14, 7d supply, fill #0

## 2022-10-03 ENCOUNTER — Other Ambulatory Visit: Payer: Self-pay

## 2022-10-17 ENCOUNTER — Other Ambulatory Visit: Payer: Self-pay

## 2022-10-17 ENCOUNTER — Ambulatory Visit: Payer: BC Managed Care – PPO | Admitting: Student

## 2022-10-17 ENCOUNTER — Other Ambulatory Visit (HOSPITAL_COMMUNITY): Payer: Self-pay

## 2022-10-17 ENCOUNTER — Ambulatory Visit (HOSPITAL_COMMUNITY)
Admission: RE | Admit: 2022-10-17 | Discharge: 2022-10-17 | Disposition: A | Discharge status: Home / Self Care | Payer: BC Managed Care – PPO | Source: Ambulatory Visit | Attending: Family Medicine | Admitting: Family Medicine

## 2022-10-17 VITALS — BP 125/78 | HR 92 | Ht 67.0 in | Wt 196.0 lb

## 2022-10-17 DIAGNOSIS — R002 Palpitations: Secondary | ICD-10-CM

## 2022-10-17 DIAGNOSIS — R051 Acute cough: Secondary | ICD-10-CM | POA: Insufficient documentation

## 2022-10-17 MED ORDER — BENZONATATE 100 MG PO CAPS
100.0000 mg | ORAL_CAPSULE | Freq: Two times a day (BID) | ORAL | 0 refills | Status: DC | PRN
  Filled 2022-10-17: qty 20, 10d supply, fill #0

## 2022-10-17 NOTE — Assessment & Plan Note (Signed)
Causes of subacute cough include post-infectious cough (most likely given recent illness) and exacerbations of underlying diseases such as asthma and chronic rhinitis. Vaccines are UTD, thus doubt pertussis. Additionally, no risk factors for TB. Other differentials, seen more commonly in chronic cough (>8 weeks) include GERD/LPR, non-asthmatic eosinophilic bronchitis and upper airway cough syndrome due to post-nasal drip are also unlikely given history. COPD would be very unlikely given physical exam and no smoking history. No history of asthma or chronic rhinitis. If cough persists for 8 weeks, would recommend chest x-ray.  Doubt infectious cause at this point as she has not had fever at any point throughout this time. Tessalon pearls for symptomatic relief, honey, humidifier.

## 2022-10-17 NOTE — Patient Instructions (Addendum)
It was great to see you today! Thank you for choosing [facility] for your primary care. Dannielle Dalfonso was seen for follow up.  Today we addressed: -I have grabbed some labs and will refer you to cardiology -Use the tessalon pearls as instructed, honey, humidifier  -If you experience chest pain, shortness of breath, leg swelling, please see a healthcare provider   If you haven't already, sign up for My Chart to have easy access to your labs results, and communication with your primary care physician.  We are checking some labs today. If they are abnormal, I will call you. If they are normal, I will send you a MyChart message (if it is active) or a letter in the mail. If you do not hear about your labs in the next 2 weeks, please call the office. I recommend that you always bring your medications to each appointment as this makes it easy to ensure you are on the correct medications and helps [place] not miss refills when you need them. Call the clinic at [phone] if your symptoms worsen or you have any concerns.  You should return to our clinic Return in about 3 weeks (around 11/07/2022) for heart palpitations. Please arrive 15 minutes before your appointment to ensure smooth check in process.  We appreciate your efforts in making this happen.  Thank you for allowing me to participate in your care, Erskine Emery, MD 10/17/2022, 1:58 PM PGY-2, [place]

## 2022-10-17 NOTE — Assessment & Plan Note (Addendum)
H/o heart murmur?  Although unable to today.  Patient reports of heart palpitations, will continue with TSH to evaluate for thyroid abnormalities in addition to CBC for anemia workup.  Will also refer to cardiology for possible Zio patch to monitor for changes in rhythm.  Patient has a family history of CAD as well, may benefit from cardiac calcium score.  Reassuringly patient is hemodynamically stable here in office, EKG shows NSR, Perc score 0, HEART score low risk. Return in 3-4 weeks with strict return precautions.

## 2022-10-17 NOTE — Progress Notes (Signed)
  SUBJECTIVE:   CHIEF COMPLAINT / HPI:   Cough  Treated with Augmentin for concern of PNA, last dose one week ago.  Cough is back for about three days, did resolve initially when taking Augmentin  Worse over last night  No nasal congestion  No asthma, COPD, inhaler use  No dyspnea or chest pain  No fever or chills  Negative COVID at work   Heart Murmur? Patient reports that she has a history of heart murmur   Patient has palpitations ongoing for years.  Flutters used to happen twice a month. Worse over the last four months.  Gets fatigue after it occurs  Grandmother and grandfather with a h/o CAD Saturday, felt like the symptoms were worse and made her very fatigued, felt her heart was out of rhythm almost the entire day.  No B Symptoms.  No weight loss  No orthopnea   PERTINENT  PMH / PSH:  Seizure disorder Anemia  Anxiety  PCOS  OBJECTIVE:  BP 125/78   Pulse 92   Ht 5' 7"$  (1.702 m)   Wt 196 lb (88.9 kg)   LMP 05/28/2021   SpO2 100%   BMI 30.70 kg/m  Physical Exam  General: Alert and oriented in no apparent distress Heart: Regular rate and rhythm with no murmurs appreciated Lungs: CTA bilaterally, no wheezing Abdomen: Bowel sounds present, no abdominal pain Skin: Warm and dry Extremities: No lower extremity edema   ASSESSMENT/PLAN:  Heart palpitations Assessment & Plan: H/o heart murmur?  Although unable to today.  Patient reports of heart palpitations, will continue with TSH to evaluate for thyroid abnormalities in addition to CBC for anemia workup.  Will also refer to cardiology for possible Zio patch to monitor for changes in rhythm.  Patient has a family history of CAD as well, may benefit from cardiac calcium score.  Reassuringly patient is hemodynamically stable here in office, EKG shows NSR, Perc score 0, HEART score low risk. Return in 3-4 weeks with strict return precautions.   Orders: -     EKG 12-Lead -     Ambulatory referral to Cardiology -      TSH Rfx on Abnormal to Free T4 -     CBC with Differential/Platelet  Acute cough Assessment & Plan: Causes of subacute cough include post-infectious cough (most likely given recent illness) and exacerbations of underlying diseases such as asthma and chronic rhinitis. Vaccines are UTD, thus doubt pertussis. Additionally, no risk factors for TB. Other differentials, seen more commonly in chronic cough (>8 weeks) include GERD/LPR, non-asthmatic eosinophilic bronchitis and upper airway cough syndrome due to post-nasal drip are also unlikely given history. COPD would be very unlikely given physical exam and no smoking history. No history of asthma or chronic rhinitis. If cough persists for 8 weeks, would recommend chest x-ray.  Doubt infectious cause at this point as she has not had fever at any point throughout this time. Tessalon pearls for symptomatic relief, honey, humidifier.    Orders: -     Benzonatate; Take 1 capsule (100 mg total) by mouth 2 (two) times daily as needed for cough.  Dispense: 20 capsule; Refill: 0      Return in about 3 weeks (around 11/07/2022) for heart palpitations. Erskine Emery, MD 10/17/2022, 2:01 PM PGY-2, [place]

## 2022-10-18 LAB — CBC WITH DIFFERENTIAL/PLATELET
Basophils Absolute: 0.1 10*3/uL (ref 0.0–0.2)
Basos: 1 %
EOS (ABSOLUTE): 0.2 10*3/uL (ref 0.0–0.4)
Eos: 3 %
Hematocrit: 39.7 % (ref 34.0–46.6)
Hemoglobin: 13.2 g/dL (ref 11.1–15.9)
Immature Grans (Abs): 0 10*3/uL (ref 0.0–0.1)
Immature Granulocytes: 0 %
Lymphocytes Absolute: 3.3 10*3/uL — ABNORMAL HIGH (ref 0.7–3.1)
Lymphs: 41 %
MCH: 29.7 pg (ref 26.6–33.0)
MCHC: 33.2 g/dL (ref 31.5–35.7)
MCV: 89 fL (ref 79–97)
Monocytes Absolute: 0.8 10*3/uL (ref 0.1–0.9)
Monocytes: 10 %
Neutrophils Absolute: 3.6 10*3/uL (ref 1.4–7.0)
Neutrophils: 45 %
Platelets: 419 10*3/uL (ref 150–450)
RBC: 4.45 x10E6/uL (ref 3.77–5.28)
RDW: 11.7 % (ref 11.7–15.4)
WBC: 8.1 10*3/uL (ref 3.4–10.8)

## 2022-10-18 LAB — TSH RFX ON ABNORMAL TO FREE T4: TSH: 1.48 u[IU]/mL (ref 0.450–4.500)

## 2022-10-24 ENCOUNTER — Other Ambulatory Visit: Payer: Self-pay

## 2022-10-24 ENCOUNTER — Encounter: Payer: Self-pay | Admitting: Family Medicine

## 2022-10-25 ENCOUNTER — Other Ambulatory Visit: Payer: Self-pay

## 2022-10-31 NOTE — Progress Notes (Signed)
Referring-Anita Ardelia Mems, MD Reason for referral-palpitations  HPI:42 year old female for evaluation of palpitations at request of Anita Netters, MD.  Carotid Dopplers February 2023 showed no significant abnormalities.  Echocardiogram February 2023 showed normal LV function, grade 1 diastolic dysfunction.  Laboratories February 2024 showed hemoglobin 13.2, TSH 1.480.  Patient states that over the past 2 years she has had occasions where she feels her heart skip and pause.  It has been increased in frequency since November and particularly in the past 2 months.  Her episodes last 5 to 12 hours.  She feels headache and fatigue with these episodes.  She has not had syncope.  She does not have sustained palpitations of heart racing.  She denies exertional chest pain.  She has some dyspnea on exertion but no orthopnea, PND or pedal edema.  Cardiology now asked to evaluate.  Note she has a smart watch and has recorded rhythm strips at the time of her symptoms that show sinus rhythm with PVCs.  Current Outpatient Medications  Medication Sig Dispense Refill   lacosamide (VIMPAT) 200 MG TABS tablet Take 1 tablet (200 mg total) by mouth at bedtime. 90 tablet 1   lamoTRIgine (LAMICTAL) 200 MG tablet Take 1.5 tablets (300 mg total) by mouth 2 (two) times daily. 270 tablet 4   metoprolol succinate (TOPROL XL) 25 MG 24 hr tablet Take 1 tablet (25 mg total) by mouth at bedtime. 90 tablet 3   No current facility-administered medications for this visit.    Allergies  Allergen Reactions   Dye Fdc Red [Food Color Pink] Nausea And Vomiting   Flagyl [Metronidazole Hcl]     GI upset     Past Medical History:  Diagnosis Date   Anemia    Anxiety    H/O: hysterectomy 07/07/2021   Heart murmur    Newborn product of in vitro fertilization (IVF) pregnancy    Seizures ([place])    Last seizure in 2013   TIA (transient ischemic attack)     Past Surgical History:  Procedure Laterality Date    ABDOMINAL HYSTERECTOMY     arm surgery     CESAREAN SECTION MULTI-GESTATIONAL WITH TUBAL N/A 08/25/2017   Procedure: CESAREAN SECTION MULTI-GESTATIONAL WITH TUBAL;  Surgeon: Louretta Shorten, MD;  Location: [facility];  Service: Obstetrics;  Laterality: N/A;  Primary edc 09/08/17 need RNFA   HYSTEROSCOPY WITH D & C N/A 10/09/2015   Procedure: DILATATION AND CURETTAGE /HYSTEROSCOPY ;  Surgeon: Louretta Shorten, MD;  Location: [facility];  Service: Gynecology;  Laterality: N/A;   LAPAROSCOPY N/A 10/09/2015   Procedure: LAPAROSCOPY DIAGNOSTIC with CO 2 laser  WITH CHROMOPERTUBATION AND LASER OF ENDOMETRIOSIS WITH LYSIS OF ADHESIONS;  Surgeon: Louretta Shorten, MD;  Location: [facility];  Service: Gynecology;  Laterality: N/A;    Social History   Socioeconomic History   Marital status: Married    Spouse name: Not on file   Number of children: 2   Years of education: college   Highest education level: Not on file  Occupational History    Employer: [employer]  Tobacco Use   Smoking status: Never   Smokeless tobacco: Never  Substance and Sexual Activity   Alcohol use: No   Drug use: No   Sexual activity: Yes    Birth control/protection: Surgical  Other Topics Concern   Not on file  Social History Narrative   Patient lives at home with her boyfriend. Patient works full time at [employer].   Education- [employer]  Right handed.   Caffeine- coffee four times a week.   Trying to get pregnant.  05-13-15   Social Determinants of Health   Financial Resource Strain: Not on file  Food Insecurity: Not on file  Transportation Needs: Not on file  Physical Activity: Not on file  Stress: Not on file  Social Connections: Not on file  Intimate Partner Violence: Not on file    Family History  Problem Relation Age of Onset   Healthy Mother    Healthy Father    Heart disease Maternal Grandmother    Bone cancer Maternal Grandfather     ROS: no fevers or chills, productive cough, hemoptysis, dysphasia,  odynophagia, melena, hematochezia, dysuria, hematuria, rash, seizure activity, orthopnea, PND, pedal edema, claudication. Remaining systems are negative.  Physical Exam:   Blood pressure (!) 142/82, pulse (!) 106, height '5\' 7"'$  (1.702 m), weight 196 lb 9.6 oz (89.2 kg), last menstrual period 05/28/2021, SpO2 96 %.  General:  Well developed/well nourished in NAD Skin warm/dry Patient not depressed No peripheral clubbing Back-normal HEENT-normal/normal eyelids Neck supple/normal carotid upstroke bilaterally; no bruits; no JVD; no thyromegaly chest - CTA/ normal expansion CV - RRR/normal S1 and S2; no murmurs, rubs or gallops;  PMI nondisplaced Abdomen -NT/ND, no HSM, no mass, + bowel sounds, no bruit 2+ femoral pulses, no bruits Ext-no edema, chords, 2+ DP Neuro-grossly nonfocal  ECG -October 17, 2022-normal sinus rhythm with no ST changes.  Personally reviewed  A/P  1 palpitations-patient has recorded rhythm strips with her smart watch at time of symptoms and this shows sinus rhythm with PVCs.  Note her LV function is normal on recent echocardiogram.  Previous TSH also normal.  She does not consume alcohol and has limited intake of caffeine.  We will add Toprol 25 mg nightly for symptomatic relief.  We can advance as needed.  2 seizure disorder-Per primary care.  Kirk Ruths, MD

## 2022-11-07 ENCOUNTER — Ambulatory Visit: Payer: BC Managed Care – PPO | Attending: Cardiology | Admitting: Cardiology

## 2022-11-07 ENCOUNTER — Encounter: Payer: Self-pay | Admitting: Cardiology

## 2022-11-07 VITALS — BP 142/82 | HR 106 | Ht 67.0 in | Wt 196.6 lb

## 2022-11-07 DIAGNOSIS — R002 Palpitations: Secondary | ICD-10-CM

## 2022-11-07 MED ORDER — METOPROLOL SUCCINATE ER 25 MG PO TB24: 25.0000 mg | ORAL_TABLET | Freq: Every day | ORAL | 3 refills | Status: DC

## 2022-11-07 NOTE — Patient Instructions (Signed)
Medication Instructions:   START METOPROLOL SUCC ER 25 MG ONCE DAILY AT BEDTIME  *If you need a refill on your cardiac medications before your next appointment, please call your pharmacy*   Follow-Up: At [facility], you and your health needs are our priority.  As part of our continuing mission to provide you with exceptional heart care, we have created designated Provider Care Teams.  These Care Teams include your primary Cardiologist (physician) and Advanced Practice Providers (APPs -  Physician Assistants and Nurse Practitioners) who all work together to provide you with the care you need, when you need it.  We recommend signing up for the patient portal called "MyChart".  Sign up information is provided on this After Visit Summary.  MyChart is used to connect with patients for Virtual Visits (Telemedicine).  Patients are able to view lab/test results, encounter notes, upcoming appointments, etc.  Non-urgent messages can be sent to your provider as well.   To learn more about what you can do with MyChart, go to [email or web address].    Your next appointment:   6 month(s)  Provider:   Kirk Ruths MD

## 2022-11-08 ENCOUNTER — Other Ambulatory Visit (HOSPITAL_COMMUNITY): Payer: Self-pay

## 2022-11-23 ENCOUNTER — Encounter: Payer: Self-pay | Admitting: Family Medicine

## 2022-11-23 ENCOUNTER — Ambulatory Visit (INDEPENDENT_AMBULATORY_CARE_PROVIDER_SITE_OTHER): Payer: BC Managed Care – PPO | Admitting: Family Medicine

## 2022-11-23 VITALS — BP 104/58 | HR 64 | Ht 67.0 in | Wt 194.6 lb

## 2022-11-23 DIAGNOSIS — Z0000 Encounter for general adult medical examination without abnormal findings: Secondary | ICD-10-CM

## 2022-11-23 DIAGNOSIS — R002 Palpitations: Secondary | ICD-10-CM

## 2022-11-23 DIAGNOSIS — Z5181 Encounter for therapeutic drug level monitoring: Secondary | ICD-10-CM | POA: Diagnosis not present

## 2022-11-23 DIAGNOSIS — Z90710 Acquired absence of both cervix and uterus: Secondary | ICD-10-CM

## 2022-11-23 DIAGNOSIS — G40309 Generalized idiopathic epilepsy and epileptic syndromes, not intractable, without status epilepticus: Secondary | ICD-10-CM

## 2022-11-23 NOTE — Patient Instructions (Addendum)
At some point soon, get your eyes checked. You look great!

## 2022-11-24 ENCOUNTER — Encounter: Payer: Self-pay | Admitting: Family Medicine

## 2022-11-24 DIAGNOSIS — Z90710 Acquired absence of both cervix and uterus: Secondary | ICD-10-CM | POA: Insufficient documentation

## 2022-11-24 LAB — COMPREHENSIVE METABOLIC PANEL
ALT: 23 IU/L (ref 0–32)
AST: 19 IU/L (ref 0–40)
Albumin/Globulin Ratio: 1.6 (ref 1.2–2.2)
Albumin: 4.3 g/dL (ref 3.9–4.9)
Alkaline Phosphatase: 76 IU/L (ref 44–121)
BUN/Creatinine Ratio: 10 (ref 9–23)
BUN: 7 mg/dL (ref 6–24)
Bilirubin Total: 0.3 mg/dL (ref 0.0–1.2)
CO2: 24 mmol/L (ref 20–29)
Calcium: 9.5 mg/dL (ref 8.7–10.2)
Chloride: 103 mmol/L (ref 96–106)
Creatinine, Ser: 0.69 mg/dL (ref 0.57–1.00)
Globulin, Total: 2.7 g/dL (ref 1.5–4.5)
Glucose: 88 mg/dL (ref 70–99)
Potassium: 4.1 mmol/L (ref 3.5–5.2)
Sodium: 140 mmol/L (ref 134–144)
Total Protein: 7 g/dL (ref 6.0–8.5)
eGFR: 110 mL/min/{1.73_m2} (ref >59)

## 2022-11-24 LAB — LIPID PANEL
Chol/HDL Ratio: 3.2 ratio (ref 0.0–4.4)
Cholesterol, Total: 175 mg/dL (ref 100–199)
HDL: 55 mg/dL (ref >39)
LDL Chol Calc (NIH): 106 mg/dL — ABNORMAL HIGH (ref 0–99)
Triglycerides: 72 mg/dL (ref 0–149)
VLDL Cholesterol Cal: 14 mg/dL (ref 5–40)

## 2022-11-24 LAB — VITAMIN D 25 HYDROXY (VIT D DEFICIENCY, FRACTURES): Vit D, 25-Hydroxy: 12.6 ng/mL — ABNORMAL LOW (ref 30.0–100.0)

## 2022-11-24 LAB — CBC
Hematocrit: 39.2 % (ref 34.0–46.6)
Hemoglobin: 13.4 g/dL (ref 11.1–15.9)
MCH: 30.1 pg (ref 26.6–33.0)
MCHC: 34.2 g/dL (ref 31.5–35.7)
MCV: 88 fL (ref 79–97)
Platelets: 389 10*3/uL (ref 150–450)
RBC: 4.45 x10E6/uL (ref 3.77–5.28)
RDW: 12 % (ref 11.7–15.4)
WBC: 5.7 10*3/uL (ref 3.4–10.8)

## 2022-11-24 NOTE — Progress Notes (Signed)
    CHIEF COMPLAINT / HPI: Well adult check Follows with neurology regarding seizures. Recently saw cardiology for palpitations and was placed on metoprolol succinate which seems to have really helped things. Has not had any more episodes of diplopia or blurred vision.  Admits she has not seen the eye doctor for follow-up of the diplopia nor for her regular checkup.   PERTINENT  PMH / PSH: I have reviewed the patient's medications, allergies, past medical and surgical history, smoking status and updated in the EMR as appropriate.   OBJECTIVE:  BP (!) 104/58   Pulse 64   Ht 5\' 7"  (1.702 m)   Wt 194 lb 9.6 oz (88.3 kg)   LMP 05/28/2021   SpO2 99%   BMI 30.48 kg/m  Vital signs reviewed. GENERAL: Well-developed, well-nourished, no acute distress. CARDIOVASCULAR: Regular rate and rhythm no murmur gallop or rub LUNGS: Clear to auscultation bilaterally, no rales or wheeze. ABDOMEN: Soft positive bowel sounds NEURO: No gross focal neurological deficits. MSK: Movement of extremity x 4. HEENT: Pupils equal round reactive to light and accommodation.  Extraocular muscles are intact.  No nystagmus.  Neck is supple.  ASSESSMENT / PLAN:   Well adult exam Check labs including vitamin D, lipids.  Does not have a cervix and a Pap smear is needed.  Recommended she get regular eye check.  Do not think she needs to see ophthalmology specifically since she has not had any more episodes of diplopia.  Discussed healthy lifestyle.  Heart palpitations Doing well on 25 mg Toprol XL.  Continue.  Followed by cardiology.  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  Will check some labs today.   Dorcas Mcmurray MD

## 2022-11-24 NOTE — Assessment & Plan Note (Signed)
Check labs including vitamin D, lipids.  Does not have a cervix and a Pap smear is needed.  Recommended she get regular eye check.  Do not think she needs to see ophthalmology specifically since she has not had any more episodes of diplopia.  Discussed healthy lifestyle.

## 2022-11-24 NOTE — Assessment & Plan Note (Signed)
She follows with neurology.  Will check some labs today.

## 2022-11-24 NOTE — Assessment & Plan Note (Signed)
Doing well on 25 mg Toprol XL.  Continue.  Followed by cardiology.

## 2022-11-25 LAB — SPECIMEN STATUS REPORT

## 2022-11-25 LAB — HEPATITIS C ANTIBODY: Hep C Virus Ab: NONREACTIVE

## 2022-11-28 NOTE — Progress Notes (Signed)
Hepatitis C normal

## 2022-12-02 ENCOUNTER — Other Ambulatory Visit: Payer: Self-pay

## 2022-12-04 ENCOUNTER — Other Ambulatory Visit: Payer: Self-pay | Admitting: Neurology

## 2022-12-05 ENCOUNTER — Encounter: Payer: Self-pay | Admitting: Family Medicine

## 2022-12-05 ENCOUNTER — Other Ambulatory Visit (HOSPITAL_COMMUNITY): Payer: Self-pay

## 2022-12-05 MED ORDER — CYCLOBENZAPRINE HCL 10 MG PO TABS: 10.0000 mg | ORAL_TABLET | Freq: Every evening | ORAL | 1 refills | Status: DC | PRN

## 2022-12-05 MED ORDER — CYCLOBENZAPRINE HCL 10 MG PO TABS
10.0000 mg | ORAL_TABLET | Freq: Two times a day (BID) | ORAL | 1 refills | Status: DC | PRN
  Filled 2022-12-05: qty 30, 10d supply, fill #0

## 2022-12-26 ENCOUNTER — Other Ambulatory Visit: Payer: Self-pay | Admitting: Neurology

## 2023-01-06 ENCOUNTER — Other Ambulatory Visit: Payer: Self-pay

## 2023-01-20 ENCOUNTER — Other Ambulatory Visit (HOSPITAL_COMMUNITY): Payer: Self-pay

## 2023-01-20 ENCOUNTER — Encounter: Payer: Self-pay | Admitting: Neurology

## 2023-01-20 ENCOUNTER — Other Ambulatory Visit: Payer: Self-pay | Admitting: Neurology

## 2023-01-20 MED ORDER — LAMOTRIGINE 200 MG PO TABS
300.0000 mg | ORAL_TABLET | Freq: Two times a day (BID) | ORAL | 0 refills | Status: DC
  Filled 2023-01-20: qty 90, 30d supply, fill #0
  Filled 2023-02-14: qty 90, 30d supply, fill #1

## 2023-02-06 ENCOUNTER — Other Ambulatory Visit: Payer: Self-pay | Admitting: Neurology

## 2023-02-06 ENCOUNTER — Other Ambulatory Visit: Payer: Self-pay

## 2023-02-06 MED ORDER — LACOSAMIDE 200 MG PO TABS
200.0000 mg | ORAL_TABLET | Freq: Every day | ORAL | 1 refills | Status: DC
  Filled 2023-02-06: qty 30, 30d supply, fill #0
  Filled 2023-03-07: qty 30, 30d supply, fill #1

## 2023-02-06 NOTE — Telephone Encounter (Signed)
Requested Prescriptions   Pending Prescriptions Disp Refills   lacosamide (VIMPAT) 200 MG TABS tablet 90 tablet 1    Sig: Take 1 tablet (200 mg total) by mouth at bedtime.   Last seen 11/30/21, next appt scheduled 06/29/23  Dispenses   Dispensed Days Supply Quantity Provider Pharmacy  lacosamide (VIMPAT) 200 MG TABS tablet 01/06/2023 30 30 tablet Glean Salvo, NP [facility]  lacosamide (VIMPAT) 200 MG TABS tablet 12/02/2022 30 30 tablet Glean Salvo, NP [place] [facility]  lacosamide (VIMPAT) 200 MG TABS tablet 10/26/2022 30 30 tablet Glean Salvo, NP [facility]  lacosamide (VIMPAT) 200 MG TABS tablet 09/21/2022 30 30 tablet Glean Salvo, NP [place] [facility]  lacosamide (VIMPAT) 200 MG TABS tablet 08/12/2022 30 30 tablet Glean Salvo, NP [place] [facility]  lacosamide (VIMPAT) 200 MG TABS tablet 07/04/2022 30 30 tablet Glean Salvo, NP [place] [facility]  lacosamide (VIMPAT) 200 MG TABS tablet 05/23/2022 30 30 tablet Glean Salvo, NP [place] [facility]  lacosamide (VIMPAT) 200 MG TABS tablet 04/20/2022 30 30 tablet Glean Salvo, NP  [facility]  LACOSAMIDE 200 MG TABLET 03/07/2022 90 90 each Glean Salvo, NP [facility] [phone] - G.Marland KitchenMarland Kitchen

## 2023-02-08 ENCOUNTER — Emergency Department (HOSPITAL_BASED_OUTPATIENT_CLINIC_OR_DEPARTMENT_OTHER)
Admission: EM | Admit: 2023-02-08 | Discharge: 2023-02-08 | Disposition: A | Discharge status: Home / Self Care | Payer: BC Managed Care – PPO | Attending: Emergency Medicine | Admitting: Emergency Medicine

## 2023-02-08 ENCOUNTER — Emergency Department (HOSPITAL_COMMUNITY): Payer: BC Managed Care – PPO

## 2023-02-08 ENCOUNTER — Other Ambulatory Visit: Payer: Self-pay

## 2023-02-08 ENCOUNTER — Emergency Department (HOSPITAL_BASED_OUTPATIENT_CLINIC_OR_DEPARTMENT_OTHER): Payer: BC Managed Care – PPO

## 2023-02-08 ENCOUNTER — Encounter (HOSPITAL_BASED_OUTPATIENT_CLINIC_OR_DEPARTMENT_OTHER): Payer: Self-pay | Admitting: Emergency Medicine

## 2023-02-08 DIAGNOSIS — Z79899 Other long term (current) drug therapy: Secondary | ICD-10-CM | POA: Insufficient documentation

## 2023-02-08 DIAGNOSIS — R2990 Unspecified symptoms and signs involving the nervous system: Secondary | ICD-10-CM

## 2023-02-08 DIAGNOSIS — R519 Headache, unspecified: Secondary | ICD-10-CM | POA: Insufficient documentation

## 2023-02-08 DIAGNOSIS — R42 Dizziness and giddiness: Secondary | ICD-10-CM | POA: Insufficient documentation

## 2023-02-08 DIAGNOSIS — H532 Diplopia: Secondary | ICD-10-CM | POA: Insufficient documentation

## 2023-02-08 HISTORY — DX: Cerebral infarction, unspecified: I63.9

## 2023-02-08 LAB — URINALYSIS, ROUTINE W REFLEX MICROSCOPIC
Bilirubin Urine: NEGATIVE
Glucose, UA: NEGATIVE mg/dL
Hgb urine dipstick: NEGATIVE
Ketones, ur: NEGATIVE mg/dL
Leukocytes,Ua: NEGATIVE
Nitrite: NEGATIVE
Protein, ur: NEGATIVE mg/dL
Specific Gravity, Urine: 1.017 (ref 1.005–1.030)
pH: 6.5 (ref 5.0–8.0)

## 2023-02-08 LAB — CBC WITH DIFFERENTIAL/PLATELET
Abs Immature Granulocytes: 0.05 10*3/uL (ref 0.00–0.07)
Basophils Absolute: 0 10*3/uL (ref 0.0–0.1)
Basophils Relative: 1 %
Eosinophils Absolute: 0.1 10*3/uL (ref 0.0–0.5)
Eosinophils Relative: 1 %
HCT: 40.6 % (ref 36.0–46.0)
Hemoglobin: 13.6 g/dL (ref 12.0–15.0)
Immature Granulocytes: 1 %
Lymphocytes Relative: 37 %
Lymphs Abs: 2.2 10*3/uL (ref 0.7–4.0)
MCH: 29.8 pg (ref 26.0–34.0)
MCHC: 33.5 g/dL (ref 30.0–36.0)
MCV: 88.8 fL (ref 80.0–100.0)
Monocytes Absolute: 0.7 10*3/uL (ref 0.1–1.0)
Monocytes Relative: 12 %
Neutro Abs: 2.9 10*3/uL (ref 1.7–7.7)
Neutrophils Relative %: 48 %
Platelets: 384 10*3/uL (ref 150–400)
RBC: 4.57 MIL/uL (ref 3.87–5.11)
RDW: 12.8 % (ref 11.5–15.5)
WBC: 5.9 10*3/uL (ref 4.0–10.5)
nRBC: 0 % (ref 0.0–0.2)

## 2023-02-08 LAB — COMPREHENSIVE METABOLIC PANEL
ALT: 12 U/L (ref 0–44)
AST: 13 U/L — ABNORMAL LOW (ref 15–41)
Albumin: 4.4 g/dL (ref 3.5–5.0)
Alkaline Phosphatase: 49 U/L (ref 38–126)
Anion gap: 8 (ref 5–15)
BUN: 11 mg/dL (ref 6–20)
CO2: 24 mmol/L (ref 22–32)
Calcium: 9.3 mg/dL (ref 8.9–10.3)
Chloride: 107 mmol/L (ref 98–111)
Creatinine, Ser: 0.68 mg/dL (ref 0.44–1.00)
GFR, Estimated: >60 mL/min (ref >60)
Glucose, Bld: 80 mg/dL (ref 70–99)
Potassium: 3.5 mmol/L (ref 3.5–5.1)
Sodium: 139 mmol/L (ref 135–145)
Total Bilirubin: 0.4 mg/dL (ref 0.3–1.2)
Total Protein: 7.3 g/dL (ref 6.5–8.1)

## 2023-02-08 LAB — RAPID URINE DRUG SCREEN, HOSP PERFORMED
Amphetamines: NOT DETECTED
Barbiturates: NOT DETECTED
Benzodiazepines: NOT DETECTED
Cocaine: NOT DETECTED
Opiates: NOT DETECTED
Tetrahydrocannabinol: NOT DETECTED

## 2023-02-08 LAB — APTT: aPTT: 30 seconds (ref 24–36)

## 2023-02-08 LAB — PREGNANCY, URINE: Preg Test, Ur: NEGATIVE

## 2023-02-08 LAB — ETHANOL: Alcohol, Ethyl (B): <10 mg/dL (ref <10)

## 2023-02-08 LAB — PROTIME-INR
INR: 0.9 (ref 0.8–1.2)
Prothrombin Time: 12.4 seconds (ref 11.4–15.2)

## 2023-02-08 MED ORDER — GADOBUTROL 1 MMOL/ML IV SOLN
8.0000 mL | Freq: Once | INTRAVENOUS | Status: AC | PRN
  Administered 2023-02-08: 8 mL via INTRAVENOUS

## 2023-02-08 MED ORDER — METOCLOPRAMIDE HCL 5 MG/ML IJ SOLN
10.0000 mg | Freq: Once | INTRAMUSCULAR | Status: AC
  Administered 2023-02-08: 10 mg via INTRAVENOUS
  Filled 2023-02-08: qty 2

## 2023-02-08 MED ORDER — SODIUM CHLORIDE 0.9 % IV BOLUS
1000.0000 mL | Freq: Once | INTRAVENOUS | Status: AC
  Administered 2023-02-08: 1000 mL via INTRAVENOUS

## 2023-02-08 MED ORDER — MECLIZINE HCL 25 MG PO TABS
25.0000 mg | ORAL_TABLET | Freq: Once | ORAL | Status: AC
  Administered 2023-02-08: 25 mg via ORAL
  Filled 2023-02-08: qty 1

## 2023-02-08 NOTE — ED Notes (Signed)
Patient transported to MRI 

## 2023-02-08 NOTE — Discharge Instructions (Addendum)
Your MRI was normal. Call your neurologist today to schedule an appointment for further evaluation. Return to the ER for new or worsening symptoms.

## 2023-02-08 NOTE — ED Notes (Signed)
Patient verbalizes understanding of discharge instructions. Opportunity for questioning and answers were provided. Pt discharged from ED. 

## 2023-02-08 NOTE — ED Triage Notes (Signed)
Pt arrives pov, to triage in wheelchair with c/o dizziness ~ 30 mins after waking yesterday at 0400. Also endorses HA and "double vision"

## 2023-02-08 NOTE — ED Notes (Signed)
Pt bib care link from [facility] for further evaluation of intermittent dizziness, diplopia, and HA; pt denies symptoms currently

## 2023-02-08 NOTE — ED Provider Notes (Signed)
Care assumed from previous provider pending MRI brain.  See his note for full HPI.  In short, patient is a 42 year old female with history of TIA who presents to the ED due to sudden onset of diplopia, slurred speech, dizziness, and headache that started around 3:30 AM yesterday.  Notes it feels similar to her previous TIA.  Patient transferred from med center to obtain MRI brain. Physical Exam  BP (!) 140/68   Pulse 67   Temp 98.5 F (36.9 C)   Resp 17   Ht 5\' 7"  (1.702 m)   Wt 85.7 kg   LMP 05/28/2021   SpO2 100%   BMI 29.60 kg/m   Physical Exam Vitals and nursing note reviewed.  Constitutional:      General: She is not in acute distress.    Appearance: She is not ill-appearing.  HENT:     Head: Normocephalic.  Eyes:     Pupils: Pupils are equal, round, and reactive to light.  Cardiovascular:     Rate and Rhythm: Normal rate and regular rhythm.     Pulses: Normal pulses.     Heart sounds: Normal heart sounds. No murmur heard.    No friction rub. No gallop.  Pulmonary:     Effort: Pulmonary effort is normal.     Breath sounds: Normal breath sounds.  Abdominal:     General: Abdomen is flat. There is no distension.     Palpations: Abdomen is soft.     Tenderness: There is no abdominal tenderness. There is no guarding or rebound.  Musculoskeletal:        General: Normal range of motion.     Cervical back: Neck supple.  Skin:    General: Skin is warm and dry.  Neurological:     General: No focal deficit present.     Mental Status: She is alert.     Comments: Speech is clear, able to follow commands CN III-XII intact Normal strength in upper and lower extremities bilaterally including dorsiflexion and plantar flexion, strong and equal grip strength Sensation grossly intact throughout Moves extremities without ataxia, coordination intact No pronator drift Ambulates without difficulty  Psychiatric:        Mood and Affect: Mood normal.        Behavior: Behavior normal.      Procedures  Procedures  ED Course / MDM   Clinical Course as of 02/08/23 1302  Wed Feb 08, 2023  1013 Spoke with Dr. Rush Landmark at [facility], ED.  Patient will need MRI for further evaluation of her symptoms.  Dr. Rush Landmark accepts ED to ED transfer. [AS]    Clinical Course User Index [AS] Schutt, Edsel Petrin, PA-C   Medical Decision Making Amount and/or Complexity of Data Reviewed Labs: ordered. Decision-making details documented in ED Course. Radiology: ordered and independent interpretation performed. Decision-making details documented in ED Course.  Risk Prescription drug management.   1:25 PM reassessed patient at bedside.  Patient resting comfortably in bed.  Patient is currently asymptomatic.  She notes she had 1 episode yesterday of diplopia, slurred speech and dizziness from 3:30 AM to 11 AM and then another episode this morning.  Awaiting MRI brain.  Patient admits to improvement in headache after migraine cocktail given at [facility].  1:57 PM Discussed with Dr. Viviann Spare with neurology who notes if MRI is negative, patient may be discharged home with outpatient neurology follow-up.   MRI personally reviewed and interpreted which is negative for any acute abnormalities. Patient still asymptomatic.  Patient stable for discharge. Strict ED precautions discussed with patient. Patient states understanding and agrees to plan. Patient discharged home in no acute distress and stable vitals.      Mannie Stabile, PA-C 02/08/23 1431    Tegeler, Canary Brim, MD 02/08/23 [phone]

## 2023-02-08 NOTE — ED Notes (Signed)
Pt returned from MRI °

## 2023-02-08 NOTE — ED Provider Notes (Signed)
[place] [facility] AT [facility] Provider Note   CSN: [id number] Arrival date & time: 02/08/23  0855     History  Chief Complaint  Patient presents with   Dizziness    Anita [name] is a 42 y.o. female.  With a history of epilepsy, previous TIA, anemia, anxiety who presents to the ED for evaluation of double vision and dizziness.  She states she woke up at 3:30 AM yesterday and developed sudden onset double vision approximately 30 minutes later.  She states she had a brief episode of slurred speech a few hours later.  Her symptoms slowly improved over the day.  She states she woke up today and her symptoms have then returned.  She reports feeling off balance and swaying side-to-side when she changes positions and states this is secondary to her double vision.  Has a history of diplopia and states it was due to a TIA.  She has since started to develop a headache.  She denies any fevers or neck stiffness.  No nausea or vomiting.  Dizziness improves when she is at rest.   Dizziness Associated symptoms: headaches        Home Medications Prior to Admission medications   Medication Sig Start Date End Date Taking? Authorizing Provider  cyclobenzaprine (FLEXERIL) 10 MG tablet Take 1 tablet (10 mg total) by mouth at bedtime as needed for muscle spasms. 12/05/22   Nestor Ramp, MD  lacosamide (VIMPAT) 200 MG TABS tablet Take 1 tablet (200 mg total) by mouth at bedtime. 02/06/23   Glean Salvo, NP  lamoTRIgine (LAMICTAL) 200 MG tablet Take 1.5 tablets (300 mg total) by mouth 2 (two) times daily. Appointment needed for further refills. 01/20/23   Glean Salvo, NP  metoprolol succinate (TOPROL XL) 25 MG 24 hr tablet Take 1 tablet (25 mg total) by mouth at bedtime. 11/07/22   Lewayne Bunting, MD      Allergies    Dye fdc red [food color pink] and Flagyl [metronidazole hcl]    Review of Systems   Review of Systems  Neurological:  Positive for dizziness and  headaches.    Physical Exam Updated Vital Signs BP 103/70   Pulse 61   Temp 98.3 F (36.8 C) (Oral) Comment: Simultaneous filing. User may not have seen previous data. Comment (Src): Simultaneous filing. User may not have seen previous data.  Resp 11   Ht 5\' 7"  (1.702 m)   Wt 85.7 kg   LMP 05/28/2021   SpO2 100%   BMI 29.60 kg/m  Physical Exam Vitals and nursing note reviewed.  Constitutional:      General: She is not in acute distress.    Appearance: She is well-developed.  HENT:     Head: Normocephalic and atraumatic.  Eyes:     Conjunctiva/sclera: Conjunctivae normal.  Cardiovascular:     Rate and Rhythm: Normal rate and regular rhythm.     Heart sounds: No murmur heard. Pulmonary:     Effort: Pulmonary effort is normal. No respiratory distress.     Breath sounds: Normal breath sounds.  Abdominal:     Palpations: Abdomen is soft.     Tenderness: There is no abdominal tenderness.  Musculoskeletal:        General: No swelling.     Cervical back: Neck supple.  Skin:    General: Skin is warm and dry.     Capillary Refill: Capillary refill takes less than 2 seconds.  Neurological:  Mental Status: She is alert.     Comments:   MENTAL STATUS: AAOx3   LANG/SPEECH: Fluent, intact naming, repetition & comprehension   CRANIAL NERVES:   II: Pupils equal and reactive   III, IV, VI: EOM intact, no gaze preference or deviation, no nystagmus   V: normal sensation of the face   VII: no facial asymmetry   VIII: normal hearing to speech   MOTOR: 5/5 in both upper and lower extremities   SENSORY: Normal to touch in all extremiteis   COORD: Normal finger to nose of the right arm, mild dysmetria on finger-to-nose of the left arm, heel to shin and shoulder shrug, no tremor. No pronator drift.  Positive Romberg.   Psychiatric:        Mood and Affect: Mood normal.     ED Results / Procedures / Treatments   Labs (all labs ordered are listed, but only abnormal results are  displayed) Labs Reviewed  COMPREHENSIVE METABOLIC PANEL - Abnormal; Notable for the following components:      Result Value   AST 13 (*)    All other components within normal limits  CBC WITH DIFFERENTIAL/PLATELET  URINALYSIS, ROUTINE W REFLEX MICROSCOPIC  ETHANOL  PROTIME-INR  APTT  RAPID URINE DRUG SCREEN, HOSP PERFORMED  PREGNANCY, URINE    EKG EKG Interpretation  Date/Time:  Wednesday February 08 2023 09:12:56 EDT Ventricular Rate:  87 PR Interval:  135 QRS Duration: 91 QT Interval:  356 QTC Calculation: 429 R Axis:   66 Text Interpretation: Sinus rhythm Confirmed by Ernie Avena ([id number]) on 02/08/2023 9:40:36 AM  Radiology CT Head Wo Contrast  Result Date: 02/08/2023 CLINICAL DATA:  Transient ischemic attack (TIA). EXAM: CT HEAD WITHOUT CONTRAST TECHNIQUE: Contiguous axial images were obtained from the base of the skull through the vertex without intravenous contrast. RADIATION DOSE REDUCTION: This exam was performed according to the departmental dose-optimization program which includes automated exposure control, adjustment of the mA and/or kV according to patient size and/or use of iterative reconstruction technique. COMPARISON:  Head CT 10/04/2021.  MRI brain 10/04/2021. FINDINGS: Brain: No acute intracranial hemorrhage. Gray-white differentiation is preserved. No hydrocephalus or extra-axial collection. No mass effect or midline shift. Vascular: No hyperdense vessel or unexpected calcification. Skull: No calvarial fracture or suspicious bone lesion. Skull base is unremarkable. Sinuses/Orbits: Unremarkable. Other: None. IMPRESSION: No acute intracranial abnormality. Electronically Signed   By: Orvan Falconer M.D.   On: 02/08/2023 09:55    Procedures Procedures    Medications Ordered in ED Medications  sodium chloride 0.9 % bolus 1,000 mL (0 mLs Intravenous Stopped 02/08/23 1107)  metoCLOPramide (REGLAN) injection 10 mg (10 mg Intravenous Given 02/08/23 0955)  meclizine  (ANTIVERT) tablet 25 mg (25 mg Oral Given 02/08/23 4098)    ED Course/ Medical Decision Making/ A&P Clinical Course as of 02/08/23 1225  Wed Feb 08, 2023  1013 Spoke with Dr. Rush Landmark at [facility], ED.  Patient will need MRI for further evaluation of her symptoms.  Dr. Rush Landmark accepts ED to ED transfer. [AS]    Clinical Course User Index [AS] Donzel Romack, Edsel Petrin, PA-C                             Medical Decision Making Amount and/or Complexity of Data Reviewed Labs: ordered. Radiology: ordered.  Risk Prescription drug management.  This patient presents to the ED for concern of dizziness, diplopia, headache, this involves an extensive number of treatment options,  and is a complaint that carries with it a high risk of complications and morbidity. Emergent considerations for headache include subarachnoid hemorrhage, meningitis, temporal arteritis, glaucoma, cerebral ischemia, carotid/vertebral dissection, intracranial tumor, Venous sinus thrombosis, carbon monoxide poisoning, acute or chronic subdural hemorrhage.  Other considerations include: Migraine, Cluster headache, Hypertension, Caffeine, alcohol, or drug withdrawal, Pseudotumor cerebri, Arteriovenous malformation, Head injury, Neurocysticercosis, Post-lumbar puncture, Preeclampsia, Tension headache, Sinusitis, Cervical arthritis, Refractive error causing strain, Dental abscess, Otitis media, Temporomandibular joint syndrome, Depression, Somatoform disorder (eg, somatization) Trigeminal neuralgia, Glossopharyngeal neuralgia.   Co morbidities that complicate the patient evaluation   epilepsy, previous TIA, anemia, anxiety  My initial workup includes labs, imaging, symptom control  Additional history obtained from: Nursing notes from this visit.  I ordered, reviewed and interpreted labs which include: Stroke labs.  Labs within normal limits  I ordered imaging studies including CT head, MRI brain I independently visualized and  interpreted imaging which showed normal CT head I agree with the radiologist interpretation  Cardiac Monitoring:  The patient was maintained on a cardiac monitor.  I personally viewed and interpreted the cardiac monitored which showed an underlying rhythm of: NSR  Afebrile, hemodynamically stable.  41 year old female presenting to the ED for evaluation of binocular diplopia, dizziness, headache.  On exam she has some mild dysmetria of the left upper extremity with finger-to-nose as well as a positive Romberg.  She states her symptoms feel similar to when she had a TIA in the past.  Her symptoms began suddenly at 4 AM yesterday.  She reported only modest improvement in her symptoms with meclizine and Reglan in the ED.  Do not have MRI capabilities at [facility] and strongly believe patient would benefit from an MRI today.  Will be sent ED to ED to [facility] for MRI.  If there are abnormalities on MRI, patient would benefit from neurology consultation.  If there are no abnormalities on the MRI, she may still benefit from neurology consultation if her symptoms persist. Ultimate disposition is pending patient response to treatment and MRI results.  Patient's case discussed with Dr. Karene Fry who agrees with plan to transfer for MRI.   Note: Portions of this report may have been transcribed using voice recognition software. Every effort was made to ensure accuracy; however, inadvertent computerized transcription errors may still be present.        Final Clinical Impression(s) / ED Diagnoses Final diagnoses:  Stroke-like symptoms  Binocular vision disorder with diplopia    Rx / DC Orders ED Discharge Orders     None         Mora Bellman 02/08/23 1225    Ernie Avena, MD 02/08/23 2020

## 2023-02-08 NOTE — ED Notes (Signed)
Report given to [facility] charge, consent for transfer signed

## 2023-02-08 NOTE — ED Notes (Signed)
[name] at [facility] will send transport eta 2 hrs. Patient going to [facility] ED for MRI by [facility].-ABB(NS)

## 2023-02-08 NOTE — ED Notes (Signed)
Labwork obtained, sent to lab w temp labels

## 2023-02-10 ENCOUNTER — Telehealth: Payer: Self-pay | Admitting: Neurology

## 2023-02-10 ENCOUNTER — Encounter: Payer: Self-pay | Admitting: Family Medicine

## 2023-02-10 NOTE — Telephone Encounter (Signed)
Pt states she was in the hospital for stroke like symptoms and would like to see Sarah,NP earlier than Oct.  Please call pt to discuss.

## 2023-02-13 ENCOUNTER — Encounter: Payer: Self-pay | Admitting: Family Medicine

## 2023-02-13 DIAGNOSIS — R42 Dizziness and giddiness: Secondary | ICD-10-CM

## 2023-02-13 DIAGNOSIS — G40309 Generalized idiopathic epilepsy and epileptic syndromes, not intractable, without status epilepticus: Secondary | ICD-10-CM

## 2023-02-13 NOTE — Telephone Encounter (Signed)
Chart reviewed, I last saw her in April 2023.  At that time she had presented to the ER February 2023 with episode low back pain radiating down her legs, diplopia, weakness to the left side .MRI showed no acute problem.  MRA unremarkable.  Carotid Doppler no abnormality.  2D echo EF 60 to 65%.  She is followed routinely at our office for seizures on Vimpat and Lamictal.  She presented to the ER 02/08/23 with sudden onset diplopia, slurred speech, dizziness, headache. Given migraine cocktail with improvement in headache. Discussed with neurology, MRI brain negative. D/c home.   I would have her see Dr. Terrace Arabia for new problem visit. TIA vs complex migraine in DD. Thanks

## 2023-02-24 ENCOUNTER — Other Ambulatory Visit (HOSPITAL_COMMUNITY): Payer: Self-pay

## 2023-03-18 ENCOUNTER — Encounter (HOSPITAL_COMMUNITY): Payer: Self-pay

## 2023-03-18 ENCOUNTER — Observation Stay (HOSPITAL_COMMUNITY)
Admission: EM | Admit: 2023-03-18 | Discharge: 2023-03-20 | Disposition: A | Discharge status: Home / Self Care | Payer: BC Managed Care – PPO | Attending: Family Medicine | Admitting: Family Medicine

## 2023-03-18 ENCOUNTER — Emergency Department (HOSPITAL_COMMUNITY): Payer: BC Managed Care – PPO

## 2023-03-18 ENCOUNTER — Other Ambulatory Visit: Payer: Self-pay

## 2023-03-18 DIAGNOSIS — E872 Acidosis, unspecified: Secondary | ICD-10-CM | POA: Insufficient documentation

## 2023-03-18 DIAGNOSIS — Z79899 Other long term (current) drug therapy: Secondary | ICD-10-CM | POA: Insufficient documentation

## 2023-03-18 DIAGNOSIS — Z23 Encounter for immunization: Secondary | ICD-10-CM | POA: Insufficient documentation

## 2023-03-18 DIAGNOSIS — G40909 Epilepsy, unspecified, not intractable, without status epilepticus: Secondary | ICD-10-CM | POA: Diagnosis not present

## 2023-03-18 DIAGNOSIS — Z8673 Personal history of transient ischemic attack (TIA), and cerebral infarction without residual deficits: Secondary | ICD-10-CM | POA: Insufficient documentation

## 2023-03-18 DIAGNOSIS — E876 Hypokalemia: Secondary | ICD-10-CM | POA: Diagnosis not present

## 2023-03-18 DIAGNOSIS — R569 Unspecified convulsions: Secondary | ICD-10-CM

## 2023-03-18 DIAGNOSIS — G40919 Epilepsy, unspecified, intractable, without status epilepticus: Principal | ICD-10-CM | POA: Insufficient documentation

## 2023-03-18 LAB — COMPREHENSIVE METABOLIC PANEL
ALT: 15 U/L (ref 0–44)
AST: 19 U/L (ref 15–41)
Albumin: 3.8 g/dL (ref 3.5–5.0)
Alkaline Phosphatase: 51 U/L (ref 38–126)
Anion gap: 12 (ref 5–15)
BUN: 5 mg/dL — ABNORMAL LOW (ref 6–20)
CO2: 23 mmol/L (ref 22–32)
Calcium: 8.9 mg/dL (ref 8.9–10.3)
Chloride: 105 mmol/L (ref 98–111)
Creatinine, Ser: 0.69 mg/dL (ref 0.44–1.00)
GFR, Estimated: >60 mL/min (ref >60)
Glucose, Bld: 137 mg/dL — ABNORMAL HIGH (ref 70–99)
Potassium: 3.4 mmol/L — ABNORMAL LOW (ref 3.5–5.1)
Sodium: 140 mmol/L (ref 135–145)
Total Bilirubin: 0.4 mg/dL (ref 0.3–1.2)
Total Protein: 7.1 g/dL (ref 6.5–8.1)

## 2023-03-18 LAB — LACTIC ACID, PLASMA
Lactic Acid, Venous: 3.7 mmol/L (ref 0.5–1.9)
Lactic Acid, Venous: 1.2 mmol/L (ref 0.5–1.9)

## 2023-03-18 LAB — CBC WITH DIFFERENTIAL/PLATELET
Abs Immature Granulocytes: 0.04 10*3/uL (ref 0.00–0.07)
Basophils Absolute: 0 10*3/uL (ref 0.0–0.1)
Basophils Relative: 0 %
Eosinophils Absolute: 0 10*3/uL (ref 0.0–0.5)
Eosinophils Relative: 0 %
HCT: 40.2 % (ref 36.0–46.0)
Hemoglobin: 13.4 g/dL (ref 12.0–15.0)
Immature Granulocytes: 0 %
Lymphocytes Relative: 16 %
Lymphs Abs: 1.6 10*3/uL (ref 0.7–4.0)
MCH: 29.8 pg (ref 26.0–34.0)
MCHC: 33.3 g/dL (ref 30.0–36.0)
MCV: 89.3 fL (ref 80.0–100.0)
Monocytes Absolute: 0.4 10*3/uL (ref 0.1–1.0)
Monocytes Relative: 4 %
Neutro Abs: 7.9 10*3/uL — ABNORMAL HIGH (ref 1.7–7.7)
Neutrophils Relative %: 80 %
Platelets: 383 10*3/uL (ref 150–400)
RBC: 4.5 MIL/uL (ref 3.87–5.11)
RDW: 12.4 % (ref 11.5–15.5)
WBC: 9.9 10*3/uL (ref 4.0–10.5)
nRBC: 0 % (ref 0.0–0.2)

## 2023-03-18 LAB — CBG MONITORING, ED
Glucose-Capillary: 133 mg/dL — ABNORMAL HIGH (ref 70–99)
Glucose-Capillary: 123 mg/dL — ABNORMAL HIGH (ref 70–99)

## 2023-03-18 MED ORDER — LEVETIRACETAM IN NACL 1500 MG/100ML IV SOLN
1500.0000 mg | Freq: Once | INTRAVENOUS | Status: AC
  Administered 2023-03-18: 1500 mg via INTRAVENOUS
  Filled 2023-03-18: qty 100

## 2023-03-18 MED ORDER — SODIUM CHLORIDE 0.9 % IV SOLN
12.5000 mg | Freq: Once | INTRAVENOUS | Status: DC
  Filled 2023-03-18: qty 0.5

## 2023-03-18 MED ORDER — LAMOTRIGINE 25 MG PO TABS
150.0000 mg | ORAL_TABLET | Freq: Two times a day (BID) | ORAL | Status: DC
  Administered 2023-03-18: 150 mg via ORAL
  Filled 2023-03-18: qty 6

## 2023-03-18 MED ORDER — ONDANSETRON HCL 4 MG/2ML IJ SOLN
4.0000 mg | Freq: Four times a day (QID) | INTRAMUSCULAR | Status: DC | PRN
  Administered 2023-03-18: 4 mg via INTRAVENOUS
  Filled 2023-03-18: qty 2

## 2023-03-18 MED ORDER — ACETAMINOPHEN 325 MG PO TABS
650.0000 mg | ORAL_TABLET | Freq: Four times a day (QID) | ORAL | Status: DC | PRN
  Administered 2023-03-19: 650 mg via ORAL
  Filled 2023-03-18: qty 2

## 2023-03-18 MED ORDER — VALPROATE SODIUM 100 MG/ML IV SOLN
1500.0000 mg | Freq: Once | INTRAVENOUS | Status: DC
  Filled 2023-03-18: qty 15

## 2023-03-18 MED ORDER — LORAZEPAM 2 MG/ML IJ SOLN
4.0000 mg | INTRAMUSCULAR | Status: DC | PRN
  Administered 2023-03-18: 4 mg via INTRAVENOUS
  Filled 2023-03-18 (×3): qty 2

## 2023-03-18 MED ORDER — ONDANSETRON HCL 4 MG/2ML IJ SOLN
4.0000 mg | Freq: Once | INTRAMUSCULAR | Status: AC
  Administered 2023-03-18: 4 mg via INTRAVENOUS
  Filled 2023-03-18: qty 2

## 2023-03-18 MED ORDER — SODIUM CHLORIDE 0.9 % IV SOLN
150.0000 mg | Freq: Two times a day (BID) | INTRAVENOUS | Status: DC
  Administered 2023-03-18: 150 mg via INTRAVENOUS
  Filled 2023-03-18 (×4): qty 15

## 2023-03-18 MED ORDER — POLYETHYLENE GLYCOL 3350 17 G PO PACK: 17.0000 g | PACK | Freq: Every day | ORAL | Status: DC | PRN

## 2023-03-18 MED ORDER — ENOXAPARIN SODIUM 40 MG/0.4ML IJ SOSY
40.0000 mg | PREFILLED_SYRINGE | INTRAMUSCULAR | Status: DC
  Administered 2023-03-18 – 2023-03-19 (×2): 40 mg via SUBCUTANEOUS
  Filled 2023-03-18 (×2): qty 0.4

## 2023-03-18 MED ORDER — TETANUS-DIPHTH-ACELL PERTUSSIS 5-2.5-18.5 LF-MCG/0.5 IM SUSY
0.5000 mL | PREFILLED_SYRINGE | Freq: Once | INTRAMUSCULAR | Status: AC
  Administered 2023-03-18: 0.5 mL via INTRAMUSCULAR
  Filled 2023-03-18: qty 0.5

## 2023-03-18 MED ORDER — ACETAMINOPHEN 650 MG RE SUPP: 650.0000 mg | Freq: Four times a day (QID) | RECTAL | Status: DC | PRN

## 2023-03-18 MED ORDER — LACOSAMIDE 50 MG PO TABS
150.0000 mg | ORAL_TABLET | Freq: Two times a day (BID) | ORAL | Status: DC
  Administered 2023-03-19: 150 mg via ORAL
  Filled 2023-03-18 (×3): qty 3

## 2023-03-18 MED ORDER — LACTATED RINGERS IV SOLN: INTRAVENOUS | Status: AC

## 2023-03-18 MED ORDER — METOPROLOL SUCCINATE ER 25 MG PO TB24
25.0000 mg | ORAL_TABLET | Freq: Every day | ORAL | Status: DC
  Administered 2023-03-18 – 2023-03-19 (×2): 25 mg via ORAL
  Filled 2023-03-18 (×2): qty 1

## 2023-03-18 NOTE — ED Provider Notes (Signed)
[place] EMERGENCY DEPARTMENT AT [facility] Provider Note   CSN: [id number] Arrival date & time: 03/18/23  1348     History  Chief Complaint  Patient presents with   Seizures    Anita [name] is a 42 y.o. female with past medical history anemia, anxiety, PVCs, seizure disorder who presents to the ED complaining of 2 breakthrough seizures this morning.  States that typically she has petit mall seizures and she had 2 of these that she believes lasted around 2 minutes each.  Notes that during when she accidentally fell off the bed breaking her glasses sustaining small abrasions to the left of her eye.  She denies associated loss of consciousness, headache, vision changes, or eye pain.  She does state that approximately 5 days ago she was diagnosed with COVID-19 and had symptoms that day and the next day but feels that she is fully recovered from this.  She states that she currently feels mildly nauseated but otherwise has no symptoms including chest pain, shortness of breath, vomiting, diarrhea.  No recent fever or neck pain.  Says that she has not had seizures in approximately 2+ years.  She is followed by neurology annually.  She has been compliant with her antiepileptic regimen.  Last Tdap 6 years ago.  She states that she has an allergy to red food dye but has never had an allergic reaction to Ativan, Valium, or other benzodiazepines.    Home Medications Prior to Admission medications   Medication Sig Start Date End Date Taking? Authorizing Provider  cyclobenzaprine (FLEXERIL) 10 MG tablet Take 1 tablet (10 mg total) by mouth at bedtime as needed for muscle spasms. 12/05/22   Nestor Ramp, MD  lacosamide (VIMPAT) 200 MG TABS tablet Take 1 tablet (200 mg total) by mouth at bedtime. 02/06/23   Glean Salvo, NP  lamoTRIgine (LAMICTAL) 200 MG tablet Take 1.5 tablets (300 mg total) by mouth 2 (two) times daily. Appointment needed for further refills. 01/20/23   Glean Salvo, NP   metoprolol succinate (TOPROL XL) 25 MG 24 hr tablet Take 1 tablet (25 mg total) by mouth at bedtime. 11/07/22   Lewayne Bunting, MD      Allergies    Dye fdc red [food color pink] and Flagyl [metronidazole hcl]    Review of Systems   Review of Systems  All other systems reviewed and are negative.   Physical Exam Updated Vital Signs BP 104/87   Pulse 95   Temp 98 F (36.7 C) (Oral)   Resp 18   Ht 5\' 7"  (1.702 m)   Wt 86.2 kg   LMP 05/28/2021   SpO2 98%   BMI 29.76 kg/m  Physical Exam Vitals and nursing note reviewed.  Constitutional:      General: She is not in acute distress.    Appearance: Normal appearance. She is not ill-appearing, toxic-appearing or diaphoretic.  HENT:     Head: Normocephalic.     Comments: Minor abrasions lateral to the left eye    Mouth/Throat:     Mouth: Mucous membranes are moist.  Eyes:     General: No visual field deficit or scleral icterus.       Right eye: No discharge.        Left eye: No discharge.     Extraocular Movements: Extraocular movements intact.     Conjunctiva/sclera: Conjunctivae normal.     Pupils: Pupils are equal, round, and reactive to light.  Cardiovascular:  Rate and Rhythm: Normal rate and regular rhythm.     Heart sounds: No murmur heard. Pulmonary:     Effort: Pulmonary effort is normal. No respiratory distress.     Breath sounds: Normal breath sounds.  Abdominal:     General: Abdomen is flat.     Palpations: Abdomen is soft.     Tenderness: There is no abdominal tenderness.  Musculoskeletal:        General: Normal range of motion.     Cervical back: Normal range of motion and neck supple. No rigidity.     Right lower leg: No edema.     Left lower leg: No edema.  Skin:    General: Skin is warm and dry.     Capillary Refill: Capillary refill takes less than 2 seconds.     Coloration: Skin is not jaundiced or pale.  Neurological:     General: No focal deficit present.     Mental Status: She is alert  and oriented to person, place, and time.     Cranial Nerves: Cranial nerves 2-12 are intact. No cranial nerve deficit, dysarthria or facial asymmetry.     Sensory: Sensation is intact.     Motor: Motor function is intact. No weakness, tremor, atrophy, abnormal muscle tone or seizure activity.     Coordination: Coordination is intact.     Comments: Slightly delayed responses to questions but does answer appropriately  Psychiatric:        Speech: Speech normal.        Behavior: Behavior is cooperative.     ED Results / Procedures / Treatments   Labs (all labs ordered are listed, but only abnormal results are displayed) Labs Reviewed  COMPREHENSIVE METABOLIC PANEL - Abnormal; Notable for the following components:      Result Value   Potassium 3.4 (*)    Glucose, Bld 137 (*)    BUN 5 (*)    All other components within normal limits  CBC WITH DIFFERENTIAL/PLATELET - Abnormal; Notable for the following components:   Neutro Abs 7.9 (*)    All other components within normal limits  LACTIC ACID, PLASMA - Abnormal; Notable for the following components:   Lactic Acid, Venous 3.7 (*)    All other components within normal limits  CBG MONITORING, ED - Abnormal; Notable for the following components:   Glucose-Capillary 133 (*)    All other components within normal limits  LAMOTRIGINE LEVEL  URINALYSIS, ROUTINE W REFLEX MICROSCOPIC  RAPID URINE DRUG SCREEN, HOSP PERFORMED    EKG None  Radiology CT Head Wo Contrast  Result Date: 03/18/2023 CLINICAL DATA:  Mental status change.  Seizure.  Facial trauma. EXAM: CT HEAD WITHOUT CONTRAST TECHNIQUE: Contiguous axial images were obtained from the base of the skull through the vertex without intravenous contrast. RADIATION DOSE REDUCTION: This exam was performed according to the departmental dose-optimization program which includes automated exposure control, adjustment of the mA and/or kV according to patient size and/or use of iterative  reconstruction technique. COMPARISON:  CT head and MRI head 02/08/2023. FINDINGS: Brain: No evidence of acute infarction, hemorrhage, hydrocephalus, extra-axial collection or mass lesion/mass effect. Vascular: No hyperdense vessel or unexpected calcification. Skull: Normal. Negative for fracture or focal lesion. Sinuses/Orbits: No acute finding. Other: None. IMPRESSION: No acute intracranial abnormality. Electronically Signed   By: Darliss Cheney M.D.   On: 03/18/2023 16:44   DG Chest 2 View  Result Date: 03/18/2023 CLINICAL DATA:  COVID, breakthrough seizure. EXAM: CHEST - 2  VIEW COMPARISON:  Chest radiograph dated July 15, 2022 FINDINGS: The heart size and mediastinal contours are within normal limits. Both lungs are clear. The visualized skeletal structures are unremarkable. IMPRESSION: No active cardiopulmonary disease. Electronically Signed   By: Larose Hires D.O.   On: 03/18/2023 14:27    Procedures Procedures    Medications Ordered in ED Medications  promethazine (PHENERGAN) 12.5 mg in sodium chloride 0.9 % 50 mL IVPB (has no administration in time range)  valproate (DEPACON) 1,500 mg in dextrose 5 % 50 mL IVPB (has no administration in time range)  ondansetron (ZOFRAN) injection 4 mg (4 mg Intravenous Given 03/18/23 1432)  Tdap (BOOSTRIX) injection 0.5 mL (0.5 mLs Intramuscular Given 03/18/23 1453)    ED Course/ Medical Decision Making/ A&P Clinical Course as of 03/18/23 1730  Sat Mar 18, 2023  1545 Called to bedside for reported seizure.  Nursing staff stated that patient was getting up to go to the bathroom when she suddenly became confused, looking around but not following commands.  This lasted less than a minute.  Patient/husband states that this is typical presentation for her seizures.  By the time I arrived to bedside, patient was alert and oriented, answering questions appropriately.  Will hold Ativan as patient does not have any active seizure-like activity at this time.  Will  add CT scan of the brain given third episode of reported breakthrough seizure today as well as facial trauma. [MG]  1645 Called to bedside for seizure-like activity.  Nursing staff reports that patient began to shake all over with decreased level of consciousness.  She was given dose of standing order of Ativan for seizure-like activity.  On my initial assessment, patient is sleeping, no signs of acute respiratory distress.  Husband at bedside confirms episode.  Patient has had multiple episodes of feeling like she was going to seize while in the ED.  With this, will consult neurology.  Labs unrevealing of cause.  CT brain normal.  Lactic acid is elevated.  This was obtained following first episode as above. [MG]    Clinical Course User Index [MG] Tonette Lederer, PA-C                            Medical Decision Making Amount and/or Complexity of Data Reviewed Labs: ordered. Decision-making details documented in ED Course. Radiology: ordered. Decision-making details documented in ED Course. ECG/medicine tests: ordered. Decision-making details documented in ED Course.  Risk Prescription drug management. Decision regarding hospitalization.   Medical Decision Making:   Endya Austin is a 42 y.o. female who presented to the ED today with seizure detailed above.    Patient's presentation is complicated by their history of seizure disorder, anemia, anxiety, polypharmacy.  Complete initial physical exam performed, notably the patient was in no acute distress.  She had slightly delayed responses to questions but was answering appropriately.  Nonfocal neuroexam.  No respiratory distress.  Nontoxic-appearing.  No meningismus. Reviewed and confirmed nursing documentation for past medical history, family history, social history.    Initial Assessment:   With the patient's presentation, differential diagnosis includes but is not limited to breakthrough seizure, status epilepticus, electrolyte  disturbance, metabolic/infectious process.  This is most consistent with an acute complicated illness  Initial Plan:  Screening labs including CBC and Metabolic panel to evaluate for infectious or metabolic etiology of disease.  CXR to evaluate for structural/infectious intrathoracic pathology.  EKG to evaluate for cardiac pathology  Lamictal level Zofran for nausea Ativan ordered as needed for seizures while in the ED Tdap updated Objective evaluation as below reviewed   Initial Study Results:   Laboratory  All laboratory results reviewed without evidence of clinically relevant pathology.   Exceptions include: K3.4, lactic 3.7  EKG EKG was reviewed independently.  Normal sinus rhythm.  Nonspecific T wave abnormalities.  No STEMI.  Radiology:  All images reviewed independently. Agree with radiology report at this time.   CT Head Wo Contrast  Result Date: 03/18/2023 CLINICAL DATA:  Mental status change.  Seizure.  Facial trauma. EXAM: CT HEAD WITHOUT CONTRAST TECHNIQUE: Contiguous axial images were obtained from the base of the skull through the vertex without intravenous contrast. RADIATION DOSE REDUCTION: This exam was performed according to the departmental dose-optimization program which includes automated exposure control, adjustment of the mA and/or kV according to patient size and/or use of iterative reconstruction technique. COMPARISON:  CT head and MRI head 02/08/2023. FINDINGS: Brain: No evidence of acute infarction, hemorrhage, hydrocephalus, extra-axial collection or mass lesion/mass effect. Vascular: No hyperdense vessel or unexpected calcification. Skull: Normal. Negative for fracture or focal lesion. Sinuses/Orbits: No acute finding. Other: None. IMPRESSION: No acute intracranial abnormality. Electronically Signed   By: Darliss Cheney M.D.   On: 03/18/2023 16:44   DG Chest 2 View  Result Date: 03/18/2023 CLINICAL DATA:  COVID, breakthrough seizure. EXAM: CHEST - 2 VIEW  COMPARISON:  Chest radiograph dated July 15, 2022 FINDINGS: The heart size and mediastinal contours are within normal limits. Both lungs are clear. The visualized skeletal structures are unremarkable. IMPRESSION: No active cardiopulmonary disease. Electronically Signed   By: Larose Hires D.O.   On: 03/18/2023 14:27      Consults: Case discussed with neurology, Dr. Derry Lory, who recommended loading dose of Depacon which he will order and change Vimpat dosing to 150 mg twice daily, admit for observation, and will see if needed.  Updated him as patient is on high dose of Lamictal, will change Depacon to loading dose of Keppra instead.  Case discussed with family medicine who will admit.  Final Assessment and Plan:   42 year old female presents to the ED for breakthrough seizures.  She did have an episode this morning where she fell and hit her head on a nightstand.  Small abrasions lateral to the eye.  Tdap updated.  On initial exam, patient is neurologically intact.  She had multiple episodes throughout ED stay where she was noted to be staring off into space, confused, episodes lasting less than 1 minute each.  States that she has a history of both absence type and tonic-clonic seizures.  Throughout ED stay, lactic obtained which was elevated as above.  No other significant lab abnormality.  Of note, she did recently have COVID-19.  Chest x-ray negative.  She reports currently being asymptomatic from that.  Afebrile.  No meningismus.  She then proceeded to have another episode of a seizure more consistent with a tonic-clonic seizure.  Given dose of Ativan for this.  Consulted neurology who recommended admission for observation, changing Vimpat dosing as above, and loading dose of Keppra.  Consulted family medicine who will admit.  Patient stable at time of admission.   Clinical Impression:  1. Breakthrough seizure (HCC)   2. Hypokalemia      Admit           Final Clinical Impression(s) /  ED Diagnoses Final diagnoses:  Breakthrough seizure (HCC)  Hypokalemia    Rx / DC Orders  ED Discharge Orders     None         Richardson Dopp 03/18/23 1753    Rondel Baton, MD 03/22/23 1215

## 2023-03-18 NOTE — ED Notes (Signed)
Attempted to ambulate pt to go urine sample and pt started to shake all over, pt helped back into bed and EDP at bedside

## 2023-03-18 NOTE — H&P (Cosign Needed Addendum)
Hospital Admission History and Physical Service Pager: [phone]  Patient name: Anita [name] Medical record number: [id number] Date of Birth: October 24, 1980 Age: 42 y.o. Gender: female  Primary Care Provider: Nestor Ramp, MD Consultants: Neurology  Code Status: Full code which was confirmed with family if patient unable to confirm   Preferred Emergency Contact:  Contact Information     Name Relation Home Work Mobile   Popov,justin Spouse [phone]  [phone]      Other Contacts     Name Relation Home Work Mobile   Evans,Donna A Mother [phone]         Chief Complaint: Seizures   Assessment and Plan: Anita [name] is a 42 y.o. female presenting with increased seizure activity. Differential for presentation of this includes breakthrough seizure 2/2 to recent viral illness, head trauma, ineffective medication control, infection, emotional stress, recent stroke-like symptoms/TIA. Most likely this is secondary to recent viral illness. Could be ineffective medication dose due to neurology recommending Vimpat being BID instead of daily. With her lactic acid being elevated, possible she could have an infection but she has been afebrile and without leukocytosis with no signs of respiratory infection on CXR or exam. Could be residual from prior TIA in 02/08/23 however MRI of her brain was WNL at the time and her CT head was negative this admission.   Discussed results with husband at bedside including neurology recommendations for mediation changes and no driving for 6 months due to new breakthrough seizures.   [facility]     * (Principal) Seizure [facility])     Patient currently post-ictal. Obs overnight for breakthrough seizure  activity.  - Admit to FMTS, attending Dr. Linwood Dibbles  - neurology consulted, appreciate recommendations  - Cardiac monitoring for 24 hours  - Med-tele, Vital signs per floor - NPO while post-ictal, can resume regular diet once  back to mental  baseline  - VTE prophylaxis: Lovenox - AM CBC/BMP  - Fall precautions - Seizure precautions - Zofran 4 mg PRN for nausea  - Miralax PRN for constipation  - Tylenol Prn for pain or HA  - repeat EKG 7/21 AEDs - s/p Keppra load in ED - continue Vimpat 150 mg BID  - continue Lamictal 150 mg BID  - Ativan for seizures >5 min  - consider rescue medication at discharge         Lactic acidosis     LA 3.7 - start LR at 125 mL/hr for 4 hours.  - recheck LA s/p fluids      Chronic and Stable medical conditions:  H/o Anemia: hgb stable PVCs: continue metoprolol XR 25 mg nightly  Muscle spasms: did not order flexeril 10 mg as medication was discontinued on 11/2022   FEN/GI: NPO, pending resolution of post-ictal state  VTE Prophylaxis: Lovenox   Disposition: med-surg  History of Present Illness:  Anita [name] is a 42 y.o. female presenting with new onset breakthrough seizures. She reports having 2 absence type seizures earlier today, one of which caused her to fall and hit her head on her bed causing an abrasion to her left eye and her glasses to break. She denies LOC, HA, vision changes. She has been compliant with her home AEDs including Vimpat 200 mg daily and Lamictal 300 mg BID. She has been seizure free for 4 years.   Five days ago she was feeling ill and was diagnosed with COVID, but feels like she fully  recovered with no lingering symptoms.   In the ED, patient was alert and oriented. She had several episodes of feeling like she was going to seize in the ED. Reported 1 minute event of shaking where she received a Keppra load. CT head wo contrast showed no acute abnormality, CXR negative. EKG WNL. Labs significant for K of 3.4, hgb 13.4, and LA 3.7. Neurology was consulted in the ED and recommended observing overnight off EEG s/p Keppra load, increasing Vimpat to 150 mg BID and increasing Lamotrigine to 300 mg BID. FMTS to admit for observation s/p seizure  activity.   Review Of Systems: Per HPI with the following additions: as above   Pertinent Past Medical History: Anemia  Seizures Stroke/TIA  Heart murmur  Remainder reviewed in history tab.   Pertinent Past Surgical History: Abdominal hysterectomy  C-section   Remainder reviewed in history tab.   Pertinent Social History: Tobacco use: never  Alcohol use: denies  Other Substance use: denies  Lives with boyfriend   Pertinent Family History: Mother and father both healthy  Remainder reviewed in history tab.   Important Outpatient Medications: Flexeril 10 mg  Vimpat 200 mg daily at bedtime  Lamictal 200 mg BID  Toprol XL 25 mg daily at bedtime  Remainder reviewed in medication history.   Objective: BP 106/77   Pulse 91   Temp 98 F (36.7 C) (Oral)   Resp 13   Ht 5\' 7"  (1.702 m)   Wt 86.2 kg   LMP 05/28/2021   SpO2 99%   BMI 29.76 kg/m  Exam: Sleepy but well-appearing, no acute distress Cardio: Regular rate, regular rhythm, no murmurs on exam. Pulm: Clear, no wheezing, no crackles. No increased work of breathing Abdominal: bowel sounds present, soft, non-tender, non-distended Extremities: no peripheral edema  Eye: shallow abrasions near left eye, no evidence of corneal trauma  Neuro: sleepy but arousable, oriented x4 and answers questions appropriately, pupils equal and reactive to light, strength intact.   Labs:  CBC BMET  Recent Labs  Lab 03/18/23 1401  WBC 9.9  HGB 13.4  HCT 40.2  PLT 383   Recent Labs  Lab 03/18/23 1401  NA 140  K 3.4*  CL 105  CO2 23  BUN 5*  CREATININE 0.69  GLUCOSE 137*  CALCIUM 8.9    Pertinent additional labs lactate 3.7.   EKG: sinus rhythm, mild ST depression in lead II and I, will repeat KG tomorrow, Qtc 452   Imaging Studies Performed:  CXR:  IMPRESSION: No active cardiopulmonary disease.  CT head WO Contrast:  IMPRESSION: No acute intracranial abnormality.   Glendale Chard, DO 03/18/2023, 6:58  PM PGY-2, [facility]  FPTS Intern pager: [phone], text pages welcome Secure chat group [facility] Teaching Service

## 2023-03-18 NOTE — ED Notes (Signed)
The pt  is sitting talking to her husband  relaxed

## 2023-03-18 NOTE — ED Notes (Signed)
Pt yelled out and began having seizure like activity for about a minute, RN notified. O2 applied at 15lpm per RN

## 2023-03-18 NOTE — Assessment & Plan Note (Addendum)
LA 3.7 Resolved, CK pending

## 2023-03-18 NOTE — ED Notes (Signed)
ED TO INPATIENT HANDOFF REPORT  ED Nurse Name and Phone #: [phone]   S Name/Age/Gender Anita [name] 42 y.o. female Room/Bed: 035C/035C  Code Status   Code Status: Full Code  Home/SNF/Other Home Patient oriented to: self, place, time, and situation Is this baseline? Yes   Triage Complete: Triage complete  Chief Complaint Seizure [facility]) [R56.9]  Triage Note Pt from home with ems for two seizures that occurred earlier today, pt is compliant with her lamictal and vimpat. Last seizure prior to today was 2-3 years ago. Pt states during one of her seizures she did hit her left eye and broke her glasses. Pt c.o nausea and eye pain at this time. Pt was also recently diagnosed with COVID on Monday. Pt arrives A.o   Allergies Allergies  Allergen Reactions   Dye Fdc Red [Food Color Pink] Nausea And Vomiting   Flagyl [Metronidazole Hcl]     GI upset    Level of Care/Admitting Diagnosis ED Disposition     ED Disposition  Admit   Condition  --   Comment  Hospital Area: [facility] [[id number]]  Level of Care: Telemetry Medical [104]  May place patient in observation at [facility] or [facility] if equivalent level of care is available:: Yes  Covid Evaluation: Asymptomatic - no recent exposure (last 10 days) testing not required  Diagnosis: Seizure [facility]) [[id number]]  Admitting Physician: Glendale Chard [[id number]]  Attending Physician: Caro Laroche [[id number]]          B Medical/Surgery History Past Medical History:  Diagnosis Date   Anemia    Anxiety    H/O: hysterectomy 07/07/2021   Heart murmur    Newborn product of in vitro fertilization (IVF) pregnancy    Seizures (HCC)    Last seizure in 2013   Stroke [facility])    TIA (transient ischemic attack)    Past Surgical History:  Procedure Laterality Date   ABDOMINAL HYSTERECTOMY     arm surgery     CESAREAN SECTION MULTI-GESTATIONAL WITH TUBAL N/A 08/25/2017   Procedure: CESAREAN SECTION  MULTI-GESTATIONAL WITH TUBAL;  Surgeon: Candice Camp, MD;  Location: [facility] BIRTHING SUITES;  Service: Obstetrics;  Laterality: N/A;  Primary edc 09/08/17 need RNFA   HYSTEROSCOPY WITH D & C N/A 10/09/2015   Procedure: DILATATION AND CURETTAGE /HYSTEROSCOPY ;  Surgeon: Candice Camp, MD;  Location: [facility] ORS;  Service: Gynecology;  Laterality: N/A;   LAPAROSCOPY N/A 10/09/2015   Procedure: LAPAROSCOPY DIAGNOSTIC with CO 2 laser  WITH CHROMOPERTUBATION AND LASER OF ENDOMETRIOSIS WITH LYSIS OF ADHESIONS;  Surgeon: Candice Camp, MD;  Location: [facility] ORS;  Service: Gynecology;  Laterality: N/A;     A IV Location/Drains/Wounds Patient Lines/Drains/Airways Status     Active Line/Drains/Airways     Name Placement date Placement time Site Days   Peripheral IV 03/18/23 20 G Left Antecubital 03/18/23  1355  Antecubital  less than 1   Peripheral IV 03/18/23 22 G 1" Anterior;Distal;Right Forearm 03/18/23  2115  Forearm  less than 1   Incision - 2 Ports Abdomen 1: Umbilicus 2: Lower;Mid 10/09/15  5956  -- 2717            Intake/Output Last 24 hours No intake or output data in the 24 hours ending 03/18/23 2203  Labs/Imaging Results for orders placed or performed during the hospital encounter of 03/18/23 (from the past 48 hour(s))  Comprehensive metabolic panel     Status: Abnormal   Collection Time: 03/18/23  2:01 PM  Result Value Ref Range   Sodium 140 135 - 145 mmol/L   Potassium 3.4 (L) 3.5 - 5.1 mmol/L   Chloride 105 98 - 111 mmol/L   CO2 23 22 - 32 mmol/L   Glucose, Bld 137 (H) 70 - 99 mg/dL    Comment: Glucose reference range applies only to samples taken after fasting for at least 8 hours.   BUN 5 (L) 6 - 20 mg/dL   Creatinine, Ser 2.95 0.44 - 1.00 mg/dL   Calcium 8.9 8.9 - 62.1 mg/dL   Total Protein 7.1 6.5 - 8.1 g/dL   Albumin 3.8 3.5 - 5.0 g/dL   AST 19 15 - 41 U/L   ALT 15 0 - 44 U/L   Alkaline Phosphatase 51 38 - 126 U/L   Total Bilirubin 0.4 0.3 - 1.2 mg/dL   GFR, Estimated >30 >86  mL/min    Comment: (NOTE) Calculated using the CKD-EPI Creatinine Equation (2021)    Anion gap 12 5 - 15    Comment: Performed at [facility], [address] [place], [place] [address]  CBC with Differential/Platelet     Status: Abnormal   Collection Time: 03/18/23  2:01 PM  Result Value Ref Range   WBC 9.9 4.0 - 10.5 K/uL   RBC 4.50 3.87 - 5.11 MIL/uL   Hemoglobin 13.4 12.0 - 15.0 g/dL   HCT 96.2 95.2 - 84.1 %   MCV 89.3 80.0 - 100.0 fL   MCH 29.8 26.0 - 34.0 pg   MCHC 33.3 30.0 - 36.0 g/dL   RDW 32.4 40.1 - 02.7 %   Platelets 383 150 - 400 K/uL   nRBC 0.0 0.0 - 0.2 %   Neutrophils Relative % 80 %   Neutro Abs 7.9 (H) 1.7 - 7.7 K/uL   Lymphocytes Relative 16 %   Lymphs Abs 1.6 0.7 - 4.0 K/uL   Monocytes Relative 4 %   Monocytes Absolute 0.4 0.1 - 1.0 K/uL   Eosinophils Relative 0 %   Eosinophils Absolute 0.0 0.0 - 0.5 K/uL   Basophils Relative 0 %   Basophils Absolute 0.0 0.0 - 0.1 K/uL   Immature Granulocytes 0 %   Abs Immature Granulocytes 0.04 0.00 - 0.07 K/uL    Comment: Performed at [facility], [address], [place], [place] [address]  CBG monitoring, ED     Status: Abnormal   Collection Time: 03/18/23  2:03 PM  Result Value Ref Range   Glucose-Capillary 133 (H) 70 - 99 mg/dL    Comment: Glucose reference range applies only to samples taken after fasting for at least 8 hours.  Lactic acid, plasma     Status: Abnormal   Collection Time: 03/18/23  3:51 PM  Result Value Ref Range   Lactic Acid, Venous 3.7 (HH) 0.5 - 1.9 mmol/L    Comment: CRITICAL RESULT CALLED TO, READ BACK BY AND VERIFIED WITH T.SHROPSHIRE RN @1653  07.2024 E.AHMED Performed at [facility], [address], [place], [place] [address]   CBG monitoring, ED     Status: Abnormal   Collection Time: 03/18/23  5:29 PM  Result Value Ref Range   Glucose-Capillary 123 (H) 70 - 99 mg/dL    Comment: Glucose reference range applies only to samples taken after fasting for at least 8  hours.   CT Head Wo Contrast  Result Date: 03/18/2023 CLINICAL DATA:  Mental status change.  Seizure.  Facial trauma. EXAM: CT HEAD WITHOUT CONTRAST TECHNIQUE: Contiguous  axial images were obtained from the base of the skull through the vertex without intravenous contrast. RADIATION DOSE REDUCTION: This exam was performed according to the departmental dose-optimization program which includes automated exposure control, adjustment of the mA and/or kV according to patient size and/or use of iterative reconstruction technique. COMPARISON:  CT head and MRI head 02/08/2023. FINDINGS: Brain: No evidence of acute infarction, hemorrhage, hydrocephalus, extra-axial collection or mass lesion/mass effect. Vascular: No hyperdense vessel or unexpected calcification. Skull: Normal. Negative for fracture or focal lesion. Sinuses/Orbits: No acute finding. Other: None. IMPRESSION: No acute intracranial abnormality. Electronically Signed   By: Darliss Cheney M.D.   On: 03/18/2023 16:44   DG Chest 2 View  Result Date: 03/18/2023 CLINICAL DATA:  COVID, breakthrough seizure. EXAM: CHEST - 2 VIEW COMPARISON:  Chest radiograph dated July 15, 2022 FINDINGS: The heart size and mediastinal contours are within normal limits. Both lungs are clear. The visualized skeletal structures are unremarkable. IMPRESSION: No active cardiopulmonary disease. Electronically Signed   By: Larose Hires D.O.   On: 03/18/2023 14:27    Pending Labs Unresulted Labs (From admission, onward)     Start     Ordered   03/19/23 0500  Basic metabolic panel  Tomorrow morning,   R        03/18/23 1808   03/19/23 0500  CBC  Tomorrow morning,   R        03/18/23 1808   03/19/23 0001  Lactic acid, plasma  Once,   R        03/18/23 1858   03/18/23 1807  HIV Antibody (routine testing w rflx)  (HIV Antibody (Routine testing w reflex) panel)  Once,   R        03/18/23 1808   03/18/23 1525  Urinalysis, Routine w reflex microscopic -Urine, Clean Catch   Once,   URGENT       Question:  Specimen Source  Answer:  Urine, Clean Catch   03/18/23 1524   03/18/23 1525  Rapid urine drug screen (hospital performed)  ONCE - STAT,   STAT        03/18/23 1524   03/18/23 1409  Lamotrigine level  Once,   URGENT        03/18/23 1408            Vitals/Pain Today's Vitals   03/18/23 1815 03/18/23 1830 03/18/23 1856 03/18/23 2156  BP: 98/66 106/77  98/63  Pulse: 85 91  86  Resp: 14 13  16   Temp:    98.5 F (36.9 C)  TempSrc:      SpO2: 98% 99%  98%  Weight:      Height:      PainSc:   0-No pain     Isolation Precautions No active isolations  Medications Medications  lacosamide (VIMPAT) tablet 150 mg ( Oral See Alternative 03/18/23 2154)    Or  lacosamide (VIMPAT) 150 mg in sodium chloride 0.9 % 25 mL IVPB (150 mg Intravenous New Bag/Given 03/18/23 2154)  lamoTRIgine (LAMICTAL) tablet 150 mg (has no administration in time range)  enoxaparin (LOVENOX) injection 40 mg (40 mg Subcutaneous Given 03/18/23 1856)  acetaminophen (TYLENOL) tablet 650 mg (has no administration in time range)    Or  acetaminophen (TYLENOL) suppository 650 mg (has no administration in time range)  polyethylene glycol (MIRALAX / GLYCOLAX) packet 17 g (has no administration in time range)  ondansetron (ZOFRAN) injection 4 mg (has no administration in time range)  metoprolol succinate (TOPROL-XL)  24 hr tablet 25 mg (has no administration in time range)  lactated ringers infusion ( Intravenous New Bag/Given 03/18/23 1945)  ondansetron (ZOFRAN) injection 4 mg (4 mg Intravenous Given 03/18/23 1432)  Tdap (BOOSTRIX) injection 0.5 mL (0.5 mLs Intramuscular Given 03/18/23 1453)  levETIRAcetam (KEPPRA) IVPB 1500 mg/ 100 mL premix (0 mg Intravenous Stopped 03/18/23 1910)    Mobility walks     Focused Assessments Cardiac Assessment Handoff:    No results found for: "CKTOTAL", "CKMB", "CKMBINDEX", "TROPONINI" No results found for: "DDIMER" Does the Patient currently have  chest pain? No    R Recommendations: See Admitting Provider Note  Report given to:   Additional Notes:

## 2023-03-18 NOTE — Progress Notes (Signed)
Brief Plan of care note:  Anita [name] is a 42 y.o. female with hx of epilepsy on Lamotrigine and lacosamide. For some reason, she is on Lacosamide once daily instead of the minimum BID dosing. She presents with 2 breakthrough seizures despite compliance and abrasion of her left eye in the setting of fall. She came to the ED where she then proceeded to have another seizure in the ED. She got ativan in the ED for this. She is post ictal now.  Recs: - Vimpat should always be BID dosing. Would recommend she be on Vimpat 150mg  BID - continue Lamotrigine at current dose of 300mg  BID - give a 1 time loading dose of Keppra 1500mg  IV once. - observe overnight for seizure clustering. - get [place] on board if she has more seizures despite loading with Keppra and continuing her home meds. - follow up with outpatient neurologist. - no driving for 6 months. Has to be seizure free before she can resume driving.  Erick Blinks [facility]

## 2023-03-18 NOTE — Assessment & Plan Note (Addendum)
Patient currently post-ictal. Obs overnight for breakthrough seizure activity.  - Admit to FMTS, attending Dr. Linwood Dibbles  - neurology consulted, appreciate recommendations  - Cardiac monitoring for 24 hours  - Med-tele, Vital signs per floor - NPO while post-ictal, can resume regular diet once back to mental baseline  - VTE prophylaxis: Lovenox - AM CBC/BMP  - Fall precautions - Seizure precautions - Zofran 4 mg PRN for nausea  - Miralax PRN for constipation  - Tylenol Prn for pain or HA  - repeat EKG 7/21 AEDs - s/p Keppra load in ED - continue Vimpat 150 mg BID  - continue Lamictal 150 mg BID

## 2023-03-18 NOTE — Plan of Care (Signed)

## 2023-03-18 NOTE — ED Triage Notes (Signed)
Pt from home with ems for two seizures that occurred earlier today, pt is compliant with her lamictal and vimpat. Last seizure prior to today was 2-3 years ago. Pt states during one of her seizures she did hit her left eye and broke her glasses. Pt c.o nausea and eye pain at this time. Pt was also recently diagnosed with COVID on Monday. Pt arrives A.o

## 2023-03-18 NOTE — Assessment & Plan Note (Addendum)
Patient currently post-ictal. Obs overnight for breakthrough seizure activity.  - Admit to FMTS, attending Dr. Linwood Dibbles  - neurology consulted, appreciate recommendations  - Cardiac monitoring for 24 hours  - Med-tele, Vital signs per floor - NPO while post-ictal, can resume regular diet once back to mental baseline  - VTE prophylaxis: Lovenox - AM CBC/BMP  - Fall precautions - Seizure precautions - Zofran 4 mg PRN for nausea  - Miralax PRN for constipation  - Tylenol Prn for pain or HA  - repeat EKG 7/21 AEDs - s/p Keppra load in ED - continue Vimpat 150 mg BID  - continue Lamictal 150 mg BID  - Ativan for seizures >5 min  - consider rescue medication at discharge

## 2023-03-18 NOTE — ED Notes (Signed)
Critical lactic acid 3.7

## 2023-03-18 NOTE — Hospital Course (Signed)
Anita [name] is a 42 y.o.female with a history of seizure disorder, PCOS, H/o hysterectomy, TIA? who was admitted to the [facility] Teaching Service at [facility] for breakthrough seizure. Her hospital course is detailed below:  Generalized Convulsive Epilepsy Patient presented with two breakthrough seizures despite compliance of AED, first seizure causing presentation to the ED. second seizure occurred while the patient was in the emergency room, received Ativan for this and became postictal.  Neurology saw patient and recommended Vimpat 150 twice daily and lamotrigine 300 twice daily with Keppra load.  She was observed overnight for seizure clustering, had no seizures overnight.  CT head unremarkable as well as chest XR.   Lactic acidosis Patient presented with lactic acidosis to 3.7 in the ED that resolved with gentle fluid resuscitation.   Other chronic conditions were medically managed with home medications and formulary alternatives as necessary if indicated   PCP Follow-up Recommendations: Follow up with outpatient neurology  No driving for 6 months

## 2023-03-18 NOTE — Progress Notes (Signed)
FMTS Brief Progress Note  S: Doing well, having thigh pain bilaterally.  Ate dinner without problem.    O: BP 113/74 (BP Location: Right Arm)   Pulse 81   Temp 98 F (36.7 C) (Axillary)   Resp 17   Ht 5\' 7"  (1.702 m)   Wt 86.2 kg   LMP 05/28/2021   SpO2 100%   BMI 29.76 kg/m   General: NAD, pleasant, able to participate in exam Respiratory: No respiratory distress Skin: warm and dry, no rashes noted Psych: Normal affect and mood   A/P: Lactic Acidosis: Recheck lactate, fluids on x4 hours, CK in AM  Seizure disorder: AED per neuro Anticipate dispo in AM  Alfredo Martinez, MD 03/18/2023, 11:00 PM PGY-3,  Family Medicine Night Resident  Please page [phone] with questions.

## 2023-03-19 DIAGNOSIS — G40909 Epilepsy, unspecified, not intractable, without status epilepticus: Secondary | ICD-10-CM | POA: Diagnosis not present

## 2023-03-19 DIAGNOSIS — G40919 Epilepsy, unspecified, intractable, without status epilepticus: Secondary | ICD-10-CM

## 2023-03-19 DIAGNOSIS — Z8673 Personal history of transient ischemic attack (TIA), and cerebral infarction without residual deficits: Secondary | ICD-10-CM | POA: Diagnosis not present

## 2023-03-19 DIAGNOSIS — E872 Acidosis, unspecified: Secondary | ICD-10-CM | POA: Diagnosis not present

## 2023-03-19 DIAGNOSIS — E876 Hypokalemia: Secondary | ICD-10-CM | POA: Diagnosis not present

## 2023-03-19 LAB — BASIC METABOLIC PANEL
Anion gap: 9 (ref 5–15)
BUN: 5 mg/dL — ABNORMAL LOW (ref 6–20)
CO2: 25 mmol/L (ref 22–32)
Calcium: 8.6 mg/dL — ABNORMAL LOW (ref 8.9–10.3)
Chloride: 105 mmol/L (ref 98–111)
Creatinine, Ser: 0.66 mg/dL (ref 0.44–1.00)
GFR, Estimated: >60 mL/min (ref >60)
Glucose, Bld: 97 mg/dL (ref 70–99)
Potassium: 3.1 mmol/L — ABNORMAL LOW (ref 3.5–5.1)
Sodium: 139 mmol/L (ref 135–145)

## 2023-03-19 LAB — CBC
HCT: 37.1 % (ref 36.0–46.0)
Hemoglobin: 12.4 g/dL (ref 12.0–15.0)
MCH: 29.9 pg (ref 26.0–34.0)
MCHC: 33.4 g/dL (ref 30.0–36.0)
MCV: 89.4 fL (ref 80.0–100.0)
Platelets: 383 10*3/uL (ref 150–400)
RBC: 4.15 MIL/uL (ref 3.87–5.11)
RDW: 12.6 % (ref 11.5–15.5)
WBC: 8.7 10*3/uL (ref 4.0–10.5)
nRBC: 0 % (ref 0.0–0.2)

## 2023-03-19 LAB — CK: Total CK: 90 U/L (ref 38–234)

## 2023-03-19 LAB — HIV ANTIBODY (ROUTINE TESTING W REFLEX): HIV Screen 4th Generation wRfx: NONREACTIVE

## 2023-03-19 MED ORDER — POTASSIUM CHLORIDE 10 MEQ/100ML IV SOLN
10.0000 meq | INTRAVENOUS | Status: DC
  Administered 2023-03-19 (×2): 10 meq via INTRAVENOUS
  Filled 2023-03-19 (×2): qty 100

## 2023-03-19 MED ORDER — LACOSAMIDE 50 MG PO TABS: 100.0000 mg | ORAL_TABLET | Freq: Every day | ORAL | Status: DC

## 2023-03-19 MED ORDER — LACOSAMIDE 50 MG PO TABS
100.0000 mg | ORAL_TABLET | Freq: Two times a day (BID) | ORAL | Status: DC
  Administered 2023-03-19: 100 mg via ORAL

## 2023-03-19 MED ORDER — LEVETIRACETAM 500 MG PO TABS
500.0000 mg | ORAL_TABLET | Freq: Two times a day (BID) | ORAL | Status: DC
  Administered 2023-03-19 – 2023-03-20 (×2): 500 mg via ORAL
  Filled 2023-03-19 (×2): qty 1

## 2023-03-19 MED ORDER — POTASSIUM CHLORIDE 20 MEQ PO PACK: 40.0000 meq | PACK | Freq: Once | ORAL | Status: DC

## 2023-03-19 MED ORDER — SODIUM CHLORIDE 0.9 % IV SOLN
100.0000 mg | Freq: Two times a day (BID) | INTRAVENOUS | Status: DC
  Filled 2023-03-19 (×2): qty 10

## 2023-03-19 MED ORDER — LAMOTRIGINE 150 MG PO TABS
300.0000 mg | ORAL_TABLET | Freq: Two times a day (BID) | ORAL | Status: DC
  Administered 2023-03-19 – 2023-03-20 (×3): 300 mg via ORAL
  Filled 2023-03-19 (×3): qty 2

## 2023-03-19 MED ORDER — SODIUM CHLORIDE 0.9 % IV SOLN
100.0000 mg | Freq: Every day | INTRAVENOUS | Status: DC
  Filled 2023-03-19: qty 10

## 2023-03-19 MED ORDER — POTASSIUM CHLORIDE CRYS ER 20 MEQ PO TBCR
40.0000 meq | EXTENDED_RELEASE_TABLET | Freq: Once | ORAL | Status: AC
  Administered 2023-03-19: 40 meq via ORAL
  Filled 2023-03-19: qty 2

## 2023-03-19 NOTE — Consult Note (Addendum)
Neurology Consultation Reason for Consult: Seizure management Requesting Physician: Ellwood Dense   CC: Breakthrough seizures   History is obtained from: Patient and chart review   HPI: Anita [name] is a 42 y.o. female with a past medical history significant for idiopathic focal epilepsy with secondary generalization  She reports she was in her usual state of health until Saturday morning when she awoke as normal at 3:30 AM.  This is the time her husband normally leaves for work and she gets up, walks the dog and takes her lamotrigine morning dose and goes back to bed.  She felt slightly disoriented and off.  When she woke up later at 7:45 AM she continued to feel off and was only out of bed for short amount of time when she felt that she might have a seizure and therefore went back to bed around 8:30 AM.  She did have a seizure but did not come to the ED immediately as she was not concerned and felt back to her normal self.  However later in the day she felt like she was going to have another seizure and therefore asked her husband to bring her to the hospital.  She did have another seizure on the way, followed by a third event in the ED.  She reports that she does have an aura which is hard for her to describe but feels like a feeling of fear /being unwell.  She does not remember the full details such as how long this aura lasts due to the seizures  She reports chronically poor sleep but no recent changes.  No recent changes in medications or new medications.  She did have COVID-19 starting on 7/15 but felt like her symptoms are actually improving and she was only ill for a couple of days.  No other signs or symptoms of infection or other medical concerns  Regarding her unusual Vimpat dosing, she reports not being aware of the fact that it was an unusual dosing schedule.  In review of the chart it seems that Vimpat was initially started at 50 twice daily but there is a phone note from April  2014 in which it noted that the patient had started taking her Vimpat 200 nightly only secondary to side effects.  Based on prescription reports there appears to have been an attempt again to revert to twice daily dosing which the patient does not recall.  All subsequent notes confirm that she is taking Vimpat 200 mg nightly.  Prior Vimpat levels do suggest that her level is close to therapeutic at this dose (when checked in the morning, but as expected lower in the afternoon/evening)  Semiology: Aura of fear/dizziness/feeling bad with progression to generalized tonic-clonic seizure.  Denies episodes of aura without seizure Postevent: Confusion which gradually clears Frequency last seizure many years ago per patient (2013 per notes) Triggers: No clear trigger identified for this event  Prior EEG 10/04/2021 Abnormal awake and asleep routine EEG due to one isolated epileptiform discharge embedded in a K complex during stage II sleep maximal over the bilateral frontocentral head regions. 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 No seizures or evolving ictal patterns seen   MRI brain 02/08/2023 with and without contrast Unremarkable  Prior antiseizure medications you have tried include Depakote Tegretol Dilantin  She reports all of these medications were discontinued due to side effects.  She feels lamotrigine has worked best for her, she gets significant dizziness with Vimpat especially if she takes  it at the same time as lamotrigine.  She confirms she has not tried Keppra before  Premorbid modified rankin scale:      0 - No symptoms.  ROS: All other review of systems was negative except as noted in the HPI.   Past Medical History:  Diagnosis Date   Anemia    Anxiety    H/O: hysterectomy 07/07/2021   Heart murmur    Newborn product of in vitro fertilization (IVF) pregnancy    Seizures (HCC)    Last seizure in 2013   Stroke [facility])    TIA (transient ischemic  attack)    Past Surgical History:  Procedure Laterality Date   ABDOMINAL HYSTERECTOMY     arm surgery     CESAREAN SECTION MULTI-GESTATIONAL WITH TUBAL N/A 08/25/2017   Procedure: CESAREAN SECTION MULTI-GESTATIONAL WITH TUBAL;  Surgeon: Candice Camp, MD;  Location: [facility] BIRTHING SUIT[facility];  Service: Obstetrics;  Laterality: N/A;  Primary edc 09/08/17 need RNFA   HYSTEROSCOPY WITH D & C N/A 10/09/2015   Procedure: DILATATION AND CURETTAGE /HYSTEROSCOPY ;  Surgeon: Candice Camp, MD;  Location: [facility] ORS;  Service: Gynecology;  Laterality: N/A;   LAPAROSCOPY N/A 10/09/2015   Procedure: LAPAROSCOPY DIAGNOSTIC with CO 2 laser  WITH CHROMOPERTUBATION AND LASER OF ENDOMETRIOSIS WITH LYSIS OF ADHESIONS;  Surgeon: Candice Camp, MD;  Location: [facility] ORS;  Service: Gynecology;  Laterality: N/A;   Current Outpatient Medications  Medication Instructions   cyclobenzaprine (FLEXERIL) 10 mg, Oral, At bedtime PRN   lacosamide (VIMPAT) 200 mg, Oral, Daily at bedtime   lamoTRIgine (LAMICTAL) 300 mg, Oral, 2 times daily, Appointment needed for further refills.   metoprolol succinate (TOPROL XL) 25 mg, Oral, Daily at bedtime     Family History  Problem Relation Age of Onset   Healthy Mother    Healthy Father    Heart disease Maternal Grandmother    Bone cancer Maternal Grandfather     Social History:  reports that she has never smoked. She has never used smokeless tobacco. She reports that she does not drink alcohol and does not use drugs.  Confirms no recent substance use to me   Exam: Current vital signs: BP 103/66 (BP Location: Left Arm)   Pulse 70   Temp 98.2 F (36.8 C) (Oral)   Resp 15   Ht 5\' 7"  (1.702 m)   Wt 86.2 kg   LMP 05/28/2021   SpO2 99%   BMI 29.76 kg/m  Vital signs in last 24 hours: Temp:  [98 F (36.7 C)-98.6 F (37 C)] 98.2 F (36.8 C) (07/21 2024) Pulse Rate:  [70-86] 70 (07/21 2024) Resp:  [15-18] 15 (07/21 2024) BP: (89-113)/(54-77) 103/66 (07/21 2024) SpO2:  [97 %-100 %] 99 %  (07/21 2024)   Physical Exam  Constitutional: Appears well-developed and well-nourished.  Psych: Affect appropriate to situation, pleasant and cooperative Eyes: No scleral injection HENT: No oropharyngeal obstruction.  MSK: no joint deformities.  Cardiovascular: Perfusing extremities well Respiratory: Effort normal, non-labored breathing GI: Soft.  No distension. There is no tenderness.  Skin: Warm dry and intact visible skin  Neuro: Mental Status: Patient is awake, alert, oriented to person, place, month, year, and situation. Patient is able to give a clear and coherent history. No signs of aphasia or neglect Cranial Nerves: II: Visual Fields are full. Pupils are equal, round, and reactive to light.   III,IV, VI: EOMI without ptosis or diploplia.  V: Facial sensation is symmetric to light touch VII: Facial movement is symmetric.  VIII: hearing is intact to voice X: Uvula elevates symmetrically XI: Shoulder shrug is symmetric. XII: tongue is midline without atrophy or fasciculations.  Motor: Tone is normal. Bulk is normal. 5/5 strength was present in all four extremities.  Sensory: Sensation is symmetric to light touch and temperature in the arms and legs. Deep Tendon Reflexes: 3+ and symmetric in the brachioradialis and patellae.  Cerebellar: FNF and HKS are intact bilaterally Gait:  Deferred   I have reviewed labs in epic and the results pertinent to this consultation are:   Basic Metabolic Panel: Recent Labs  Lab 03/18/23 1401 03/19/23 0335  NA 140 139  K 3.4* 3.1*  CL 105 105  CO2 23 25  GLUCOSE 137* 97  BUN 5* 5*  CREATININE 0.69 0.66  CALCIUM 8.9 8.6*    CBC: Recent Labs  Lab 03/18/23 1401 03/19/23 0335  WBC 9.9 8.7  NEUTROABS 7.9*  --   HGB 13.4 12.4  HCT 40.2 37.1  MCV 89.3 89.4  PLT 383 383    Coagulation Studies: No results for input(s): "LABPROT", "INR" in the last 72 hours.     Latest Reference Range & Units 11/18/15 08:54 01/25/17  08:39 06/21/17 09:09 10/04/21 14:43  Lacosamide 5.0 - 10.0 ug/mL 3.2 (L) 4.5 (L) 4.9 (L) 1.9 (L)  (L): Data is abnormally low  Lamotrigine level from admission still pending  Latest Reference Range & Units 07/29/20 08:42 08/04/21 00:00 10/04/21 14:43  Lamotrigine, Serum 2.0 - 20.0 ug/mL 9.5 11.0 14.1   I have reviewed the images obtained:  Head CT personally reviewed, agree with radiology no acute intracranial process   Impression: This is a 42 year old with past medical history significant for idiopathic focal epilepsy presenting with breakthrough seizures without a clear trigger identified.  Therefore she needs escalation in her antiseizure regimen.  After discussion of different options with patient, she is interested in trialing Keppra, since her dosing of Vimpat is an atypical and likely ineffective schedule.  Alternatively if she does not tolerate Keppra could trial adding at least 50 to 100 mg of Vimpat in the morning in addition to her nightly dose, which I defer to her outpatient provider  Recommendations: -Continue lamotrigine 300 twice daily -Lamotrigine level to be followed up by her outpatient provider -Start Keppra 500 mg twice daily -Reduce Vimpat to 100 mg nightly for 1 week, then -reduce Vimpat to 50 mg nightly for 1 week then, stop Vimpat -1 mg clonazepam oral disintegrating tab for rescue medication if she has a seizure ongoing for more than 5 minutes or clustering of seizures without return to baseline, please provide 5 tablets -Seizure precautions discussed with patient and should be included in discharge instructions -Inpatient neurology will sign off at this time, please do not hesitate to reach out if questions or concerns arise  Standard seizure precautions: Per [place] statutes, patients with seizures are not allowed to drive until  they have been seizure-free for six months. Use caution when using heavy equipment or power tools. Avoid working on ladders  or at heights. Take showers instead of baths. Ensure the water temperature is not too high on the home water heater. Do not go swimming alone. When caring for infants or small children, sit down when holding, feeding, or changing them to minimize risk of injury to the child in the event you have a seizure.  To reduce risk of seizures, maintain good sleep hygiene avoid alcohol and illicit drug use, take all anti-seizure medications as prescribed.  Brooke Dare MD-PhD [facility] [phone] Available 7 PM to 7 AM, outside of these hours please call Neurologist on call as listed on Amion.

## 2023-03-19 NOTE — Progress Notes (Signed)
     Daily Progress Note Intern Pager: [phone]  Patient name: Elisia Stepp Medical record number: [id number] Date of birth: 04/15/1981 Age: 42 y.o. Gender: female  Primary Care Provider: Nestor Ramp, MD Consultants: Neurology Code Status: FULL  Pt Overview and Major Events to Date:  Admitted to FPTS 03/18/23  Assessment and Plan: Lerin Jech is a 42 y.o. female presenting with increased seizure activity. Patient monitored overnight for seizure clustering.   (Principal) Breakthrough seizure (HCC)     No breakthrough clusters, AED per neuro s/p Keppra load, Vimpat 150 twice  daily and lamotrigine 300 twice daily. Neuro to follow outpatient.  - regular diet - VTE prophylaxis: Lovenox - Fall precautions - Seizure precautions - repeat EKG 7/21 - continue Vimpat 150 mg BID  - continue Lamictal 300 mg BID  - Ativan for seizures >5 min  - consider rescue medication at discharge      Lactic acidosis  Normalized  Resolved, CK pending-- low suspicion for rhabdomyolysis     FEN/GI: Regular PPx: Lovenox  Dispo: Plan for disposition today.   Subjective:  Doing well without complaint.   Objective: Temp:  [98 F (36.7 C)-98.5 F (36.9 C)] 98 F (36.7 C) (07/20 2238) Pulse Rate:  [81-95] 81 (07/20 2238) Resp:  [13-18] 17 (07/20 2238) BP: (98-113)/(63-87) 113/74 (07/20 2238) SpO2:  [98 %-100 %] 100 % (07/20 2238) Weight:  [86.2 kg] 86.2 kg (07/20 1357) General: NAD, pleasant, able to participate in exam Respiratory: No respiratory distress Skin: warm and dry, no rashes noted Psych: Normal affect and mood   Laboratory: Most recent CBC Lab Results  Component Value Date   WBC 9.9 03/18/2023   HGB 13.4 03/18/2023   HCT 40.2 03/18/2023   MCV 89.3 03/18/2023   PLT 383 03/18/2023   Most recent BMP    Latest Ref Rng & Units 03/18/2023    2:01 PM  BMP  Glucose 70 - 99 mg/dL 147   BUN 6 - 20 mg/dL 5   Creatinine 8.29 - 5.62 mg/dL 1.30   Sodium 865 - 784  mmol/L 140   Potassium 3.5 - 5.1 mmol/L 3.4   Chloride 98 - 111 mmol/L 105   CO2 22 - 32 mmol/L 23   Calcium 8.9 - 10.3 mg/dL 8.9      Alfredo Martinez, MD 03/19/2023, 4:33 AM  PGY-3, [facility] FPTS Intern pager: [phone], text pages welcome Secure chat group [facility] Teaching Service

## 2023-03-19 NOTE — Discharge Instructions (Addendum)
Dear Anita [name],   Thank you so much for allowing [place] to be part of your care!  You were admitted to [facility] for seizure disorder.   Neurology saw you and recommended Vimpat 150 twice daily and lamotrigine 300 twice daily. Follow up with neurology as well outpatient.   POST-HOSPITAL & CARE INSTRUCTIONS Follow up with neurology outpatient  Please let PCP/Specialists know of any changes that were made.  Please see medications section of this packet for any medication changes.   DOCTOR'S APPOINTMENT & FOLLOW UP CARE INSTRUCTIONS  Future Appointments  Date Time Provider Department Center  05/11/2023  9:00 AM Lewayne Bunting, MD [facility] None  06/29/2023 10:45 AM Glean Salvo, NP [facility] None    RETURN PRECAUTIONS: -Repeat seizures despite treatment  -worsening muscle pain -chest pain -confusion -change in mentation  Take care and be well!  Family Medicine Teaching Service  [place]  [facility]  [address] [place], [place] [address] [phone]

## 2023-03-19 NOTE — Discharge Summary (Shared)
Family Medicine Teaching [facility] Discharge Summary  Patient name: Anita [name] Medical record number: [id number] Date of birth: 08/21/1981 Age: 42 y.o. Gender: female Date of Admission: 03/18/2023  Date of Discharge: 03/20/23 Admitting Physician: Glendale Chard, DO  Primary Care Provider: Nestor Ramp, MD Consultants: Neurology  Indication for Hospitalization: Seizure activity   Discharge Diagnoses/Problem List: Principal Problem:   Breakthrough seizure [facility]) Active Problems:   Lactic acidosis  Disposition: Home  Discharge Condition: Stable   Discharge Exam:  Blood pressure 104/68, pulse 72, temperature 98.3 F (36.8 C), temperature source Oral, resp. rate 18, height 5\' 7"  (1.702 m), weight 86.2 kg, last menstrual period 05/28/2021, SpO2 98%. General: Well-appearing, no acute distress Cardio: RRR, no murmurs Pulm: CTA bilaterally.  On room air. No increased work of breathing. Abdominal: soft, non-tender, non-distended Extremities: no peripheral edema.  Moves all extremities equally. Neuro: alert and oriented, speech normal in content. Psych:  Cognition and judgment appear intact. Alert, communicative  and cooperative.  Brief Hospital Course:  Anita [name] is a 42 y.o.female with a history of seizure disorder, PCOS, H/o hysterectomy, TIA? who was admitted to the [facility] Teaching Service at [facility] for breakthrough seizure.   Her hospital course is detailed below:  Generalized Convulsive Epilepsy Patient presented with two breakthrough seizures despite compliance of AED, first seizure causing presentation to the ED and second seizure occurred while the patient was in the emergency room. She received Ativan for this and became postictal.  Unknown cause of breakthrough, possibly COVID recently. Neurology saw patient and recommended reducing Vimpat 100 mg nightly for 1 week then 50 mg for 1 week then stopping. Continued lamotrigine 300 twice daily with Keppra load. Started  Keppra 500 mg BID. She was observed for seizure clustering, had no seizures in hospital.  CT head unremarkable as well as chest XR. Returned to normal mentation before disposition with outpatient neurology follow up, Clonazepam 1mg   ODT for rescue sent to pharmacy with instructions how to use.   Lactic acidosis Patient presented with lactic acidosis to 3.7 in the ED that resolved with gentle fluid resuscitation. CK normal as well.   Other chronic conditions were medically managed with home medications and formulary alternatives as necessary if indicated   PCP Follow-up Recommendations: Follow up with outpatient neurology  Follow-up on lamotrigine level No driving for 6 months Assess AEDs, had change in medications 4.   K low and repleted before discharge; check BMP on follow up 5.   Ensure obtained Clonazepam ODT for rescue    Significant Procedures: None   Significant Labs and Imaging:  Recent Labs  Lab 03/18/23 1401 03/19/23 0335  WBC 9.9 8.7  HGB 13.4 12.4  HCT 40.2 37.1  PLT 383 383   Recent Labs  Lab 03/18/23 1401 03/19/23 0335  NA 140 139  K 3.4* 3.1*  CL 105 105  CO2 23 25  GLUCOSE 137* 97  BUN 5* 5*  CREATININE 0.69 0.66  CALCIUM 8.9 8.6*  ALKPHOS 51  --   AST 19  --   ALT 15  --   ALBUMIN 3.8  --     CT Head Wo Contrast  Result Date: 03/18/2023 CLINICAL DATA:  Mental status change.  Seizure.  Facial trauma. EXAM: CT HEAD WITHOUT CONTRAST TECHNIQUE: Contiguous axial images were obtained from the base of the skull through the vertex without intravenous contrast. RADIATION DOSE REDUCTION: This exam was performed according to the departmental dose-optimization program which includes automated exposure control, adjustment  of the mA and/or kV according to patient size and/or use of iterative reconstruction technique. COMPARISON:  CT head and MRI head 02/08/2023. FINDINGS: Brain: No evidence of acute infarction, hemorrhage, hydrocephalus, extra-axial collection or  mass lesion/mass effect. Vascular: No hyperdense vessel or unexpected calcification. Skull: Normal. Negative for fracture or focal lesion. Sinuses/Orbits: No acute finding. Other: None. IMPRESSION: No acute intracranial abnormality. Electronically Signed   By: Darliss Cheney M.D.   On: 03/18/2023 16:44   DG Chest 2 View  Result Date: 03/18/2023 CLINICAL DATA:  COVID, breakthrough seizure. EXAM: CHEST - 2 VIEW COMPARISON:  Chest radiograph dated July 15, 2022 FINDINGS: The heart size and mediastinal contours are within normal limits. Both lungs are clear. The visualized skeletal structures are unremarkable. IMPRESSION: No active cardiopulmonary disease. Electronically Signed   By: Larose Hires D.O.   On: 03/18/2023 14:27     Results/Tests Pending at Time of Discharge: Lamotrigine level   Discharge Medications:  Allergies as of 03/20/2023       Reactions   Dye Fdc Red [food Color Pink] Nausea And Vomiting   Flagyl [metronidazole Hcl]    GI upset        Medication List     STOP taking these medications    cyclobenzaprine 10 MG tablet Commonly known as: FLEXERIL       TAKE these medications    clonazePAM 0.5 MG disintegrating tablet Commonly known as: KLONOPIN Take 2 tablets (1 mg total) by mouth as needed for rescue medication if seizure ongoing for more than 5 minutes or clustering of seizures without return to baseline   Lacosamide 100 MG Tabs Take 1 tablet (100 mg total) by mouth at bedtime for 7 days, THEN 0.5 tablets (50 mg total) at bedtime for 7 days. After this is completed, stop vimpat. Start taking on: March 20, 2023 What changed:  medication strength See the new instructions.   lamoTRIgine 200 MG tablet Commonly known as: LAMICTAL Take 1.5 tablets (300 mg total) by mouth 2 (two) times daily. Appointment needed for further refills.   levETIRAcetam 500 MG tablet Commonly known as: KEPPRA Take 1 tablet (500 mg total) by mouth 2 (two) times daily.   metoprolol  succinate 25 MG 24 hr tablet Commonly known as: Toprol XL Take 1 tablet (25 mg total) by mouth at bedtime.        Discharge Instructions: Please refer to Patient Instructions section of EMR for full details.  Patient was counseled important signs and symptoms that should prompt return to medical care, changes in medications, dietary instructions, activity restrictions, and follow up appointments.   Follow-Up Appointments:  Dr. Yetta Barre 04/11/23 with [facility]    Cyndia Skeeters, DO 03/20/2023, 8:37 AM PGY-1, [facility] Family [facility]  Have reviewed the above note and made the appropriate changes.   Glendale Chard, DO [facility], PGY-2 03/20/23 8:50 AM

## 2023-03-19 NOTE — Plan of Care (Signed)

## 2023-03-19 NOTE — Progress Notes (Signed)
Saw patient at bedside with Dr. Georg Ruddle. Neurology assessed patient and recommendations in their note. She would prefer early morning discharge due to family members not being able to take her home tonight/asleep. Will plan for early AM d/c which was communicated with patient.

## 2023-03-20 ENCOUNTER — Other Ambulatory Visit (HOSPITAL_COMMUNITY): Payer: Self-pay

## 2023-03-20 ENCOUNTER — Encounter: Payer: Self-pay | Admitting: Family Medicine

## 2023-03-20 DIAGNOSIS — E876 Hypokalemia: Secondary | ICD-10-CM | POA: Diagnosis not present

## 2023-03-20 DIAGNOSIS — G40919 Epilepsy, unspecified, intractable, without status epilepticus: Secondary | ICD-10-CM | POA: Diagnosis not present

## 2023-03-20 LAB — LAMOTRIGINE LEVEL: Lamotrigine Lvl: 9.3 ug/mL (ref 2.0–20.0)

## 2023-03-20 MED ORDER — CLONAZEPAM 0.5 MG PO TBDP
1.0000 mg | ORAL_TABLET | ORAL | 0 refills | Status: DC | PRN
  Filled 2023-03-20: qty 10, 5d supply, fill #0

## 2023-03-20 MED ORDER — ORAL CARE MOUTH RINSE: 15.0000 mL | OROMUCOSAL | Status: DC | PRN

## 2023-03-20 MED ORDER — LACOSAMIDE 100 MG PO TABS
ORAL_TABLET | ORAL | 0 refills | Status: DC
  Filled 2023-03-20: qty 11, 14d supply, fill #0

## 2023-03-20 MED ORDER — LEVETIRACETAM 500 MG PO TABS
500.0000 mg | ORAL_TABLET | Freq: Two times a day (BID) | ORAL | 0 refills | Status: DC
  Filled 2023-03-20: qty 60, 30d supply, fill #0

## 2023-03-20 NOTE — Plan of Care (Signed)

## 2023-03-21 ENCOUNTER — Telehealth: Payer: Self-pay

## 2023-03-21 NOTE — Transitions of Care (Post Inpatient/ED Visit) (Unsigned)
   03/21/2023  Name: Anita [name] MRN: [id number] DOB: 08-Dec-1980  Today's TOC FU Call Status: Today's TOC FU Call Status:: Unsuccessul Call (1st Attempt) Unsuccessful Call (1st Attempt) Date: 03/21/23  Attempted to reach the patient regarding the most recent Inpatient/ED visit.  Follow Up Plan: Additional outreach attempts will be made to reach the patient to complete the Transitions of Care (Post Inpatient/ED visit) call.   Signature Karena Addison, LPN [facility] Nurse Health Advisor Direct Dial [phone]

## 2023-03-27 ENCOUNTER — Other Ambulatory Visit: Payer: Self-pay | Admitting: Neurology

## 2023-03-28 ENCOUNTER — Other Ambulatory Visit: Payer: Self-pay

## 2023-03-28 MED ORDER — LAMOTRIGINE 200 MG PO TABS
300.0000 mg | ORAL_TABLET | Freq: Two times a day (BID) | ORAL | 0 refills | Status: AC
  Filled 2023-03-28: qty 90, 30d supply, fill #0

## 2023-04-06 ENCOUNTER — Telehealth: Payer: Self-pay | Admitting: Neurology

## 2023-04-06 ENCOUNTER — Ambulatory Visit: Payer: BC Managed Care – PPO | Admitting: Neurology

## 2023-04-06 MED ORDER — LEVETIRACETAM 500 MG PO TABS: 500.0000 mg | ORAL_TABLET | Freq: Two times a day (BID) | ORAL | 3 refills | Status: DC

## 2023-04-06 NOTE — Telephone Encounter (Signed)
Meds ordered this encounter  Medications   levETIRAcetam (KEPPRA) 500 MG tablet    Sig: Take 1 tablet (500 mg total) by mouth 2 (two) times daily.    Dispense:  60 tablet    Refill:  3

## 2023-04-06 NOTE — Telephone Encounter (Signed)
Pt is requesting a refill for keppra

## 2023-04-06 NOTE — Addendum Note (Signed)
Addended by: Glean Salvo on: 04/06/2023 11:23 AM   Modules accepted: Orders

## 2023-04-11 ENCOUNTER — Inpatient Hospital Stay: Payer: Self-pay | Admitting: Student

## 2023-04-18 ENCOUNTER — Other Ambulatory Visit: Payer: Self-pay | Admitting: Neurology

## 2023-05-04 NOTE — Progress Notes (Deleted)
HPI: palpitations at request of Levert Feinstein, MD. Carotid Dopplers February 2023 showed no significant abnormalities. Echocardiogram February 2023 showed normal LV function, grade 1 diastolic dysfunction.  Brain MRI June 2024 unrevealing.  Note she has a smart watch and has recorded rhythm strips at the time of her symptoms that show sinus rhythm with PVCs.  Since last seen  Current Outpatient Medications  Medication Sig Dispense Refill   clonazePAM (KLONOPIN) 0.5 MG disintegrating tablet Take 2 tablets (1 mg total) by mouth as needed for rescue medication if seizure ongoing for more than 5 minutes or clustering of seizures without return to baseline 10 tablet 0   Lacosamide 100 MG TABS Take 1 tablet (100 mg total) by mouth at bedtime for 7 days, THEN 0.5 tablets (50 mg total) at bedtime for 7 days. After this is completed, stop vimpat. 11 tablet 0   lamoTRIgine (LAMICTAL) 200 MG tablet Take 1.5 tablets (300 mg total) by mouth 2 (two) times daily. Appointment needed for further refills. 180 tablet 0   levETIRAcetam (KEPPRA) 500 MG tablet Take 1 tablet (500 mg total) by mouth 2 (two) times daily. 60 tablet 3   metoprolol succinate (TOPROL XL) 25 MG 24 hr tablet Take 1 tablet (25 mg total) by mouth at bedtime. 90 tablet 3   No current facility-administered medications for this visit.     Past Medical History:  Diagnosis Date   Anemia    Anxiety    H/O: hysterectomy 07/07/2021   Heart murmur    Newborn product of in vitro fertilization (IVF) pregnancy    Seizures (HCC)    Last seizure in 2013   Stroke [facility])    TIA (transient ischemic attack)     Past Surgical History:  Procedure Laterality Date   ABDOMINAL HYSTERECTOMY     arm surgery     CESAREAN SECTION MULTI-GESTATIONAL WITH TUBAL N/A 08/25/2017   Procedure: CESAREAN SECTION MULTI-GESTATIONAL WITH TUBAL;  Surgeon: Candice Camp, MD;  Location: [facility] BIRTHING SUITES;  Service: Obstetrics;  Laterality: N/A;  Primary edc  09/08/17 need RNFA   HYSTEROSCOPY WITH D & C N/A 10/09/2015   Procedure: DILATATION AND CURETTAGE /HYSTEROSCOPY ;  Surgeon: Candice Camp, MD;  Location: [facility];  Service: Gynecology;  Laterality: N/A;   LAPAROSCOPY N/A 10/09/2015   Procedure: LAPAROSCOPY DIAGNOSTIC with CO 2 laser  WITH CHROMOPERTUBATION AND LASER OF ENDOMETRIOSIS WITH LYSIS OF ADHESIONS;  Surgeon: Candice Camp, MD;  Location: [facility] ORS;  Service: Gynecology;  Laterality: N/A;    Social History   Socioeconomic History   Marital status: Married    Spouse name: Not on file   Number of children: 2   Years of education: college   Highest education level: Not on file  Occupational History    Employer: [employer]  Tobacco Use   Smoking status: Never   Smokeless tobacco: Never  Substance and Sexual Activity   Alcohol use: No   Drug use: No   Sexual activity: Yes    Birth control/protection: Surgical  Other Topics Concern   Not on file  Social History Narrative   Patient lives at home with her boyfriend. Patient works full time at [employer].   Education- College   Right handed.   Caffeine- coffee four times a week.   Trying to get pregnant.  05-13-15   Social Determinants of Health   Financial Resource Strain: Low Risk  (05/02/2023)   Received from [facility]   Overall Financial Resource Strain (  CARDIA)    Difficulty of Paying Living Expenses: Not very hard  Food Insecurity: No Food Insecurity (05/02/2023)   Received from [facility]   Hunger Vital Sign    Worried About Running Out of Food in the Last Year: Never true    Ran Out of Food in the Last Year: Never true  Transportation Needs: No Transportation Needs (05/02/2023)   Received from [facility] - Transportation    Lack of Transportation (Medical): No    Lack of Transportation (Non-Medical): No  Physical Activity: Not on file  Stress: Not on file  Social Connections: Unknown (03/20/2023)   Received from [facility]   Social Network    Social  Network: Not on file  Intimate Partner Violence: Unknown (03/20/2023)   Received from [facility]   HITS    Physically Hurt: Not on file    Insult or Talk Down To: Not on file    Threaten Physical Harm: Not on file    Scream or Curse: Not on file    Family History  Problem Relation Age of Onset   Healthy Mother    Healthy Father    Heart disease Maternal Grandmother    Bone cancer Maternal Grandfather     ROS: no fevers or chills, productive cough, hemoptysis, dysphasia, odynophagia, melena, hematochezia, dysuria, hematuria, rash, seizure activity, orthopnea, PND, pedal edema, claudication. Remaining systems are negative.  Physical Exam: Well-developed well-nourished in no acute distress.  Skin is warm and dry.  HEENT is normal.  Neck is supple.  Chest is clear to auscultation with normal expansion.  Cardiovascular exam is regular rate and rhythm.  Abdominal exam nontender or distended. No masses palpated. Extremities show no edema. neuro grossly intact  ECG- personally reviewed  A/P  1 patient's-patient previously recorded rhythm strips with her smart watch at the time of her symptoms.  That showed sinus rhythm with PVCs.  LV function is normal.  Continue Toprol.  Symptoms are reasonable at present.  2 history of seizure disorder-managed by primary care.  Olga Millers, MD

## 2023-05-09 ENCOUNTER — Encounter: Payer: Self-pay | Admitting: Family Medicine

## 2023-05-11 ENCOUNTER — Ambulatory Visit: Payer: BC Managed Care – PPO | Attending: Cardiology | Admitting: Cardiology

## 2023-05-12 MED ORDER — CYCLOBENZAPRINE HCL 10 MG PO TABS: 10.0000 mg | ORAL_TABLET | Freq: Every day | ORAL | 0 refills | Status: DC

## 2023-06-29 ENCOUNTER — Ambulatory Visit: Payer: BC Managed Care – PPO | Admitting: Neurology

## 2023-11-14 ENCOUNTER — Other Ambulatory Visit: Payer: Self-pay | Admitting: Cardiology

## 2023-11-14 DIAGNOSIS — R002 Palpitations: Secondary | ICD-10-CM

## 2023-12-14 ENCOUNTER — Other Ambulatory Visit: Payer: Self-pay | Admitting: Family Medicine

## 2023-12-17 ENCOUNTER — Other Ambulatory Visit: Payer: Self-pay | Admitting: Cardiology

## 2023-12-17 DIAGNOSIS — R002 Palpitations: Secondary | ICD-10-CM

## 2023-12-18 ENCOUNTER — Other Ambulatory Visit: Payer: Self-pay | Admitting: Family Medicine

## 2023-12-18 MED ORDER — CYCLOBENZAPRINE HCL 10 MG PO TABS: 10.0000 mg | ORAL_TABLET | Freq: Every day | ORAL | 0 refills | Status: DC

## 2024-01-24 ENCOUNTER — Ambulatory Visit: Admitting: Family Medicine

## 2024-01-24 ENCOUNTER — Encounter: Admitting: Family Medicine

## 2024-01-24 VITALS — BP 114/74 | HR 72 | Ht 67.0 in | Wt 186.4 lb

## 2024-01-24 DIAGNOSIS — R002 Palpitations: Secondary | ICD-10-CM

## 2024-01-24 DIAGNOSIS — E876 Hypokalemia: Secondary | ICD-10-CM

## 2024-01-24 DIAGNOSIS — Z0000 Encounter for general adult medical examination without abnormal findings: Secondary | ICD-10-CM

## 2024-01-24 DIAGNOSIS — Z90710 Acquired absence of both cervix and uterus: Secondary | ICD-10-CM

## 2024-01-24 DIAGNOSIS — G40309 Generalized idiopathic epilepsy and epileptic syndromes, not intractable, without status epilepticus: Secondary | ICD-10-CM | POA: Diagnosis not present

## 2024-01-24 MED ORDER — LEVETIRACETAM 500 MG PO TABS: 1000.0000 mg | ORAL_TABLET | Freq: Two times a day (BID) | ORAL | Status: AC

## 2024-01-24 NOTE — Patient Instructions (Signed)
I will send you a note about your blood work. Great to see you! 

## 2024-01-25 ENCOUNTER — Ambulatory Visit: Payer: Self-pay | Admitting: Family Medicine

## 2024-01-25 ENCOUNTER — Encounter: Payer: Self-pay | Admitting: Family Medicine

## 2024-01-25 LAB — BASIC METABOLIC PANEL WITH GFR
BUN/Creatinine Ratio: 13 (ref 9–23)
BUN: 9 mg/dL (ref 6–24)
CO2: 20 mmol/L (ref 20–29)
Calcium: 9.5 mg/dL (ref 8.7–10.2)
Chloride: 102 mmol/L (ref 96–106)
Creatinine, Ser: 0.7 mg/dL (ref 0.57–1.00)
Glucose: 84 mg/dL (ref 70–99)
Potassium: 4.4 mmol/L (ref 3.5–5.2)
Sodium: 141 mmol/L (ref 134–144)
eGFR: 109 mL/min/{1.73_m2} (ref >59)

## 2024-01-25 NOTE — Assessment & Plan Note (Signed)
 [facility] and she is very happy with her care.  Since she had breakthrough seizure she is now in the 57-month waiting period once again where she cannot drive.  She is taking all this in stride.

## 2024-01-25 NOTE — Assessment & Plan Note (Signed)
 Having maybe some symptoms of early perimenopause but not bothersome enough to worry about at this time per her report.

## 2024-01-25 NOTE — Assessment & Plan Note (Signed)
 Was noted to have low potassium when she was last in the hospital so we will recheck that today.

## 2024-01-25 NOTE — Assessment & Plan Note (Signed)
 Updated health maintenance.  Does not need Pap as she is status post hysterectomy.  Discussed lifestyle modifications of exercise.  Non-smoker.

## 2024-01-25 NOTE — Assessment & Plan Note (Signed)
 Evaluated by cardiology and placed on beta-blocker which seems to have worked very well for her.  She will continue on that.

## 2024-01-25 NOTE — Progress Notes (Signed)
    CHIEF COMPLAINT / HPI:  Here for check up/well care No current issues except for some occasional floaters in the left eye particularly.  Has an ophthalmology appointment already set up.  Sometimes it looks like flashing lights on that side.  She called her ophthalmologist and they did not seem too concerned and gave her an appointment in a few weeks.  Has not gotten worse.  She notes that it seemed to start after she had her most recent episode of seizure activity where she had 4 seizures centrally and a 1 weekend.   PERTINENT  PMH / PSH: I have reviewed the patient's medications, allergies, past medical and surgical history, smoking status and updated in the EMR as appropriate. Seizure disorder---recent increase and change in some of her meds. Has new Neurologist Working for [employer] now and happy with job Home and family are good  OBJECTIVE:  BP 114/74   Pulse 72   Ht 5\' 7"  (1.702 m)   Wt 186 lb 6.4 oz (84.6 kg)   LMP 05/28/2021   SpO2 99%   BMI 29.19 kg/m  Vital signs reviewed GENERALl: Well developed, well nourished, in no acute distress. HEENT: PERRLA, EOMI, sclerae are nonicteric NECK: Supple, FROM, without lymphadenopathy.  THYROID : normal without nodularity CAROTID ARTERIES: without bruits LUNGS: clear to auscultation bilaterally. No wheezes or rales. Normal respiratory effort HEART: Regular rate and rhythm, no murmurs. Distal pulses are bilaterally symmetrical, 2+. ABDOMEN: soft with positive bowel sounds. No masses noted MSK: MOE x 4. Normal muscle strength, bulk and tone. SKIN no rash. Normal temperature. NEURO: no focal deficits. Normal gait. Normal balance. HEENT: optic disc of B eyes seen fairly well and appears normal as does vasculature  ASSESSMENT / PLAN: #1.  Description of flashing lights and/or eye floaters in her left eye.  I did look at the disc and looks flap.  I told her if this was continuing she need to call her ophthalmologist back and see if they can  see her sooner.  Well adult exam Updated health maintenance.  Does not need Pap as she is status post hysterectomy.  Discussed lifestyle modifications of exercise.  Non-smoker.  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  Since she had breakthrough seizure she is now in the 43-month waiting period once again where she cannot drive.  She is taking all this in stride.  History of hysterectomy,ovaries remain Having maybe some symptoms of early perimenopause but not bothersome enough to worry about at this time per her report.  Hypokalemia Was noted to have low potassium when she was last in the hospital so we will recheck that today.  Heart palpitations Evaluated by cardiology and placed on beta-blocker which seems to have worked very well for her.  She will continue on that.   Violetta Grice MD

## 2024-02-11 ENCOUNTER — Other Ambulatory Visit: Payer: Self-pay | Admitting: Family Medicine

## 2024-02-27 NOTE — Progress Notes (Signed)
 HPI: Follow-up palpitations. Carotid Dopplers February 2023 showed no significant abnormalities. Echocardiogram February 2023 showed normal LV function, grade 1 diastolic dysfunction.  Note she has a smart watch and has recorded rhythm strips at the time of her symptoms that show sinus rhythm with PVCs.  Since last seen she occasionally feels a brief flutter but not sustained.  She denies dyspnea or syncope.  Occasional sharp pain in her chest for 15 seconds not related to activities.  She does not have exertional chest pain.  Current Outpatient Medications  Medication Sig Dispense Refill   cyclobenzaprine  (FLEXERIL ) 10 MG tablet TAKE 1 TABLET BY MOUTH EVERYDAY AT BEDTIME 30 tablet 0   lamoTRIgine  (LAMICTAL ) 200 MG tablet Take 1.5 tablets (300 mg total) by mouth 2 (two) times daily. Appointment needed for further refills. 180 tablet 0   levETIRAcetam  (KEPPRA ) 500 MG tablet Take 2 tablets (1,000 mg total) by mouth 2 (two) times daily. (Patient taking differently: Take 1,000 mg by mouth 2 (two) times daily. 1000 mg in the morning and 1500 at night)     metoprolol  succinate (TOPROL -XL) 25 MG 24 hr tablet TAKE 1 TABLET (25 MG TOTAL) BY MOUTH DAILY. PT NEEDS AN APPOINTMENT 90 tablet 0   No current facility-administered medications for this visit.     Past Medical History:  Diagnosis Date   Anemia    Anxiety    H/O: hysterectomy 07/07/2021   Heart murmur    Newborn product of in vitro fertilization (IVF) pregnancy    Seizures (HCC)    Last seizure in 2013   Stroke [facility])    TIA (transient ischemic attack)     Past Surgical History:  Procedure Laterality Date   ABDOMINAL HYSTERECTOMY     arm surgery     CESAREAN SECTION MULTI-GESTATIONAL WITH TUBAL N/A 08/25/2017   Procedure: CESAREAN SECTION MULTI-GESTATIONAL WITH TUBAL;  Surgeon: Marget Lenis, MD;  Location: [facility] BIRTHING SUITES;  Service: Obstetrics;  Laterality: N/A;  Primary edc 09/08/17 need RNFA   HYSTEROSCOPY WITH D & C N/A  10/09/2015   Procedure: DILATATION AND CURETTAGE /HYSTEROSCOPY ;  Surgeon: Lenis Marget, MD;  Location: [facility];  Service: Gynecology;  Laterality: N/A;   LAPAROSCOPY N/A 10/09/2015   Procedure: LAPAROSCOPY DIAGNOSTIC with CO 2 laser  WITH CHROMOPERTUBATION AND LASER OF ENDOMETRIOSIS WITH LYSIS OF ADHESIONS;  Surgeon: Lenis Marget, MD;  Location: [facility];  Service: Gynecology;  Laterality: N/A;    Social History   Socioeconomic History   Marital status: Married    Spouse name: Not on file   Number of children: 2   Years of education: college   Highest education level: Associate degree: academic program  Occupational History    Employer: [employer]  Tobacco Use   Smoking status: Never   Smokeless tobacco: Never  Substance and Sexual Activity   Alcohol use: No   Drug use: No   Sexual activity: Yes    Birth control/protection: Surgical  Other Topics Concern   Not on file  Social History Narrative   Patient lives at home with her boyfriend. Patient works full time at [employer].   Education- College   Right handed.   Caffeine- coffee four times a week.   Trying to get pregnant.  05-13-15   Social Drivers of Corporate investment banker Strain: Low Risk  (01/17/2024)   Overall Financial Resource Strain (CARDIA)    Difficulty of Paying Living Expenses: Not very hard  Food Insecurity: No Food Insecurity (  01/17/2024)   Hunger Vital Sign    Worried About Running Out of Food in the Last Year: Never true    Ran Out of Food in the Last Year: Never true  Transportation Needs: No Transportation Needs (01/17/2024)   PRAPARE - Administrator, Civil Service (Medical): No    Lack of Transportation (Non-Medical): No  Physical Activity: Unknown (01/17/2024)   Exercise Vital Sign    Days of Exercise per Week: 0 days    Minutes of Exercise per Session: Not on file  Stress: Stress Concern Present (01/17/2024)   [employer] of Occupational Health - Occupational Stress Questionnaire     Feeling of Stress : To some extent  Social Connections: Moderately Isolated (01/17/2024)   Social Connection and Isolation Panel    Frequency of Communication with Friends and Family: More than three times a week    Frequency of Social Gatherings with Friends and Family: Once a week    Attends Religious Services: Never    Database administrator or Organizations: No    Attends Engineer, structural: Not on file    Marital Status: Married  Catering manager Violence: Not At Risk (10/26/2023)   Received from [facility]   HITS    Over the last 12 months how often did your partner physically hurt you?: Never    Over the last 12 months how often did your partner insult you or talk down to you?: Never    Over the last 12 months how often did your partner threaten you with physical harm?: Never    Over the last 12 months how often did your partner scream or curse at you?: Never    Family History  Problem Relation Age of Onset   Healthy Mother    Healthy Father    Heart disease Maternal Grandmother    Bone cancer Maternal Grandfather     ROS: no fevers or chills, productive cough, hemoptysis, dysphasia, odynophagia, melena, hematochezia, dysuria, hematuria, rash, seizure activity, orthopnea, PND, pedal edema, claudication. Remaining systems are negative.  Physical Exam: Well-developed well-nourished in no acute distress.  Skin is warm and dry.  HEENT is normal.  Neck is supple.  Chest is clear to auscultation with normal expansion.  Cardiovascular exam is regular rate and rhythm.  Abdominal exam nontender or distended. No masses palpated. Extremities show no edema. neuro grossly intact  EKG Interpretation Date/Time:  Tuesday March 05 2024 10:44:36 EDT Ventricular Rate:  70 PR Interval:  138 QRS Duration:  82 QT Interval:  390 QTC Calculation: 421 R Axis:   65  Text Interpretation: Normal sinus rhythm Normal ECG Confirmed by Pietro Rogue ([id number]) on 03/05/2024 10:53:01  AM    A/P  1 palpitations-patient previously recorded rhythm strips at the time of her symptoms which showed sinus with PVCs.  Her LV function was normal on prior echocardiogram.  Her symptoms have improved with Toprol .  Will continue.  2 chest pain-occasional sharp pain in her chest for 15 seconds not related to activities.  No exertional chest pain.  Electrocardiogram is normal.  Will not pursue further ischemia evaluation at this point as symptoms are very atypical.  3 history of seizures-managed by primary care.  Rogue Pietro, MD

## 2024-03-05 ENCOUNTER — Ambulatory Visit: Attending: Cardiology | Admitting: Cardiology

## 2024-03-05 ENCOUNTER — Encounter: Payer: Self-pay | Admitting: Cardiology

## 2024-03-05 VITALS — BP 100/78 | HR 70 | Ht 66.5 in | Wt 189.0 lb

## 2024-03-05 DIAGNOSIS — R072 Precordial pain: Secondary | ICD-10-CM

## 2024-03-05 DIAGNOSIS — R002 Palpitations: Secondary | ICD-10-CM

## 2024-03-05 MED ORDER — METOPROLOL SUCCINATE ER 25 MG PO TB24
25.0000 mg | ORAL_TABLET | Freq: Every day | ORAL | 3 refills | Status: DC
Start: 2024-03-05 — End: 2024-07-15

## 2024-03-05 NOTE — Patient Instructions (Signed)

## 2024-03-05 NOTE — Addendum Note (Signed)
 Addended by: RICHIE ADRIEN ORN on: 03/05/2024 11:06 AM   Modules accepted: Orders

## 2024-03-15 ENCOUNTER — Encounter: Payer: Self-pay | Admitting: Advanced Practice Midwife

## 2024-04-15 ENCOUNTER — Other Ambulatory Visit: Payer: Self-pay | Admitting: Family Medicine

## 2024-05-20 ENCOUNTER — Encounter: Payer: Self-pay | Admitting: Family Medicine

## 2024-06-05 ENCOUNTER — Ambulatory Visit: Admitting: Family Medicine

## 2024-06-05 ENCOUNTER — Encounter: Payer: Self-pay | Admitting: Family Medicine

## 2024-06-05 VITALS — BP 126/86 | HR 87 | Ht 66.5 in | Wt 188.6 lb

## 2024-06-05 DIAGNOSIS — R1313 Dysphagia, pharyngeal phase: Secondary | ICD-10-CM | POA: Diagnosis not present

## 2024-06-05 NOTE — Patient Instructions (Addendum)
 1) I am sending a referral for you to see a GI specialist. They may do a scope to take a closer look at your esophagus. This can give us  a clearer picture of what is causing your symptoms.

## 2024-06-05 NOTE — Progress Notes (Signed)
    SUBJECTIVE:   CHIEF COMPLAINT / HPI:   SD is a 43yo F that pf dysphagia.  - Reports about 4 month hx of feeling food gets stuck in her throat. This sensation comes and goes. - For past week, it has started to feel painful to swallow. It feels like a knot in her throat when she tries swallow - This occurs with water, food, jello. - Denies SOB, voice changes, heartburn, fevers - No prior neck surgery - No prior tobacco use, denies alcohol use.   PERTINENT  PMH / PSH: GERD, GAD, epilepsy  OBJECTIVE:   BP 126/86   Pulse 87   Ht 5' 6.5 (1.689 m)   Wt 188 lb 9.6 oz (85.5 kg)   LMP 05/28/2021   SpO2 100%   BMI 29.98 kg/m   General: Alert, pleasant woman. NAD. HEENT: NCAT. MMM.Tenderness to palpation along L anterior neck. No palpated mass or lymph nodes.  CV: RRR, no murmurs. Cap refill <2. Resp: CTAB, no wheezing or crackles. Normal WOB on RA.  Abm: Soft, nontender, nondistended. BS present. Ext: Moves all ext spontaneously Skin: Warm, well perfused   ASSESSMENT/PLAN:   Assessment & Plan Pharyngeal dysphagia Differential includes pharyngeal diverticulum, esophageal dysmotility, pharyngeal mass. No mass palpated or visualized in oropharynx or cerivcal neck. No infectious symptoms such as fever and weight stable. - GI referral for EGD  - Consider CT neck if mass is palpatable     Twyla Nearing, MD [facility]

## 2024-06-26 ENCOUNTER — Ambulatory Visit: Admitting: Family Medicine

## 2024-07-09 ENCOUNTER — Encounter: Payer: Self-pay | Admitting: Cardiology

## 2024-07-11 ENCOUNTER — Ambulatory Visit: Admitting: Cardiology

## 2024-07-14 NOTE — Progress Notes (Unsigned)
 OFFICE NOTE:    Date:  07/15/2024  ID:  Camie Janit Favors, DOB 10-09-1980, MRN [id number] PCP: Rosalynn Camie CROME, MD  [facility] Providers Cardiologist:  Redell Shallow, MD        Palpitations Premature ventricular contractions (PVCs) Echocardiogram: Ejection fraction 60-65%, normal wall motion abnormalities, grade 1 diastolic dysfunction, normal right ventricular systolic function (10/04/2021) Hx of Stroke Carotid ultrasound: No ICA stenosis bilaterally (10/04/2021) Seizure disorder        Discussed the use of AI scribe software for clinical note transcription with the patient, who gave verbal consent to proceed. History of Present Illness Seymone Forlenza is a 43 y.o. female for evaluation of dizziness and nausea when laying down.   She has experienced increased dizziness and nausea over the past two weeks, particularly when lying down. The dizziness is described as feeling 'off' rather than spinning and does not necessarily worsen with sudden head movements. She has been propping herself up to sleep to alleviate these symptoms. No vomiting, but she uses Zofran  to manage nausea.  Her palpitations have been occurring more frequently, at least once a day or every other day, accompanied by chest pain and fatigue. The chest pain is more frequent with physical activity, radiates upwards from the chest, worsens with deep breaths, and sometimes causes coughing. No recent fevers, cough, flu-like symptoms, vomiting, diarrhea, or bloody bowel movements. She does not smoke, drink alcohol, or use drugs. She consumes about half a cup of caffeine daily and has reduced soda intake. She does not engage in regular exercise. Her family history includes her father having valve issues and an aneurysm, her paternal grandfather with coronary artery disease, and her maternal grandmother having undergone triple bypass surgery and valve replacements.    ROS-See HPI    Studies Reviewed:  EKG  Interpretation Date/Time:  Monday July 15 2024 08:04:49 EST Ventricular Rate:  73 PR Interval:  144 QRS Duration:  88 QT Interval:  380 QTC Calculation: 418 R Axis:   68  Text Interpretation: Normal sinus rhythm J point elevation Normal ECG When compared with ECG of 05-Mar-2024 10:44, No significant change was found Confirmed by Lelon Hamilton [phone]) on 07/15/2024 8:07:18 AM           Physical Exam:  VS:  BP 113/75   Pulse 74   Ht 5' 6.5 (1.689 m)   Wt 190 lb 12.8 oz (86.5 kg)   LMP 05/28/2021   SpO2 96%   BMI 30.33 kg/m     Orthostatic VS for the past 24 hrs (Last 3 readings):  BP- Lying Pulse- Lying BP- Sitting Pulse- Sitting BP- Standing at 0 minutes Pulse- Standing at 0 minutes BP- Standing at 3 minutes Pulse- Standing at 3 minutes  07/15/24 0807 113/75 74 110/68 75 108/70 77 118/71 71    Wt Readings from Last 3 Encounters:  07/15/24 190 lb 12.8 oz (86.5 kg)  06/05/24 188 lb 9.6 oz (85.5 kg)  03/05/24 189 lb (85.7 kg)    Constitutional:      Appearance: Healthy appearance. Not in distress.  Neck:     Vascular: JVD normal.  Pulmonary:     Breath sounds: Normal breath sounds. No wheezing. No rales.  Chest:     Chest wall: Not tender to palpatation.  Cardiovascular:     Normal rate. Regular rhythm.     Murmurs: There is no murmur.     No rub.  Edema:    Peripheral edema absent.  Abdominal:  Palpations: Abdomen is soft.       Assessment and Plan:    Assessment & Plan Dizziness Dizziness when lying down.  Orthostatic vital signs do not demonstrate any significant blood pressure drop or heart rate increase.  Her symptoms are suspicious for positional vertigo.  However, she does not describe typical spinning sensation. - I have asked her to follow up with primary care provider for evaluation of possible positional vertigo. - Monitor will be obtained to assess palpitations  Precordial chest pain SOB (shortness of breath) Intermittent chest pain,  fatigue, and exertional shortness of breath, possibly related to PVCs. Symptoms have been more pronounced over the past two weeks.  She does not really have significant risk factors for coronary artery disease other than family history. - Order comprehensive metabolic panel, CBC, and TSH  - Arrange echocardiogram to rule out structural heart disease. - Arrange stress echocardiogram to rule out ischemia. Heart palpitations Increased frequency of PVCs which I think is contributing to her chest discomfort. Previous management with metoprolol  was effective but symptoms have recurred. No significant change on EKG.  - Increase metoprolol  succinate from 25 mg to 37.5 mg daily. - Order 7-day heart monitor to assess PVC burden and other arrhythmias. - Arrange echocardiogram to rule out structural heart disease. Other fatigue Order comprehensive metabolic panel, CBC, and TSH     Informed Consent   Shared Decision Making/Informed Consent The risks [chest pain, shortness of breath, cardiac arrhythmias, dizziness, blood pressure fluctuations, myocardial infarction, stroke/transient ischemic attack, and life-threatening complications (estimated to be 1 in 10,000)], benefits (risk stratification, diagnosing coronary artery disease, treatment guidance) and alternatives of a stress or dobutamine stress echocardiogram were discussed in detail with Ms. Wilkowski and she agrees to proceed.     Dispo:  Return in about 3 months (around 10/15/2024) for Routine Follow Up w/ Dr. Pietro.  Signed, Glendia Ferrier, PA-C

## 2024-07-15 ENCOUNTER — Encounter: Payer: Self-pay | Admitting: Physician Assistant

## 2024-07-15 ENCOUNTER — Ambulatory Visit: Attending: Physician Assistant | Admitting: Physician Assistant

## 2024-07-15 ENCOUNTER — Ambulatory Visit: Attending: Physician Assistant

## 2024-07-15 VITALS — BP 113/75 | HR 74 | Ht 66.5 in | Wt 190.8 lb

## 2024-07-15 DIAGNOSIS — R5383 Other fatigue: Secondary | ICD-10-CM

## 2024-07-15 DIAGNOSIS — R072 Precordial pain: Secondary | ICD-10-CM

## 2024-07-15 DIAGNOSIS — R42 Dizziness and giddiness: Secondary | ICD-10-CM

## 2024-07-15 DIAGNOSIS — R0602 Shortness of breath: Secondary | ICD-10-CM

## 2024-07-15 DIAGNOSIS — R002 Palpitations: Secondary | ICD-10-CM | POA: Diagnosis not present

## 2024-07-15 LAB — CBC

## 2024-07-15 MED ORDER — METOPROLOL SUCCINATE ER 25 MG PO TB24: 37.5000 mg | ORAL_TABLET | Freq: Every day | ORAL | 3 refills | Status: AC

## 2024-07-15 NOTE — Patient Instructions (Signed)
 Medication Instructions:  INCREASE Metoprolol  succinate 25 mg, take one and one half  (1.5), 37.5 mg by mouth once daily *If you need a refill on your cardiac medications before your next appointment, please call your pharmacy*  Lab Work: CBC, CMET, TSH If you have labs (blood work) drawn today and your tests are completely normal, you will receive your results only by: MyChart Message (if you have MyChart) OR A paper copy in the mail If you have any lab test that is abnormal or we need to change your treatment, we will call you to review the results.  Testing/Procedures: ECHOCARDIOGRAM, ECHOCARDIOGRAM STRESS TEST, 7 Day ZIO Monitor  Follow-Up: At [facility], you and your health needs are our priority.  As part of our continuing mission to provide you with exceptional heart care, our providers are all part of one team.  This team includes your primary Cardiologist (physician) and Advanced Practice Providers or APPs (Physician Assistants and Nurse Practitioners) who all work together to provide you with the care you need, when you need it.  Your next appointment:   3 month(s)  Provider:   Redell Shallow, MD    We recommend signing up for the patient portal called MyChart.  Sign up information is provided on this After Visit Summary.  MyChart is used to connect with patients for Virtual Visits (Telemedicine).  Patients are able to view lab/test results, encounter notes, upcoming appointments, etc.  Non-urgent messages can be sent to your provider as well.   To learn more about what you can do with MyChart, go to [email or web address].   Other Instructions Your physician has requested that you have an echocardiogram. Echocardiography is a painless test that uses sound waves to create images of your heart. It provides your doctor with information about the size and shape of your heart and how well your heart's chambers and valves are working. This procedure takes approximately  one hour. There are no restrictions for this procedure. Please do NOT wear cologne, perfume, aftershave, or lotions (deodorant is allowed). Please arrive 15 minutes prior to your appointment time.  Please note: We ask at that you not bring children with you during ultrasound (echo/ vascular) testing. Due to room size and safety concerns, children are not allowed in the ultrasound rooms during exams. Our front office staff cannot provide observation of children in our lobby area while testing is being conducted. An adult accompanying a patient to their appointment will only be allowed in the ultrasound room at the discretion of the ultrasound technician under special circumstances. We apologize for any inconvenience.    Your physician has requested that you have a stress echocardiogram. For further information please visit [email or web address] Please follow instruction sheet as given.  Please note: We ask at that you not bring children with you during ultrasound (echo/ vascular) testing. Due to room size and safety concerns, children are not allowed in the ultrasound rooms during exams. Our front office staff cannot provide observation of children in our lobby area while testing is being conducted. An adult accompanying a patient to their appointment will only be allowed in the ultrasound room at the discretion of the ultrasound technician under special circumstances. We apologize for any inconvenience.  ZIO XT- Long Term Monitor Instructions  Your physician has requested you wear a ZIO patch monitor for 7 days.  This is a single patch monitor. [employer] supplies one patch monitor per enrollment. Additional stickers are not available. Please do not apply patch if you  will be having a Nuclear Stress Test,  Echocardiogram, Cardiac CT, MRI, or Chest Xray during the period you would be wearing the  monitor. The patch cannot be worn during these tests. You cannot remove and re-apply the  ZIO XT patch  monitor.  Your ZIO patch monitor will be mailed 3 day USPS to your address on file. It may take 3-5 days  to receive your monitor after you have been enrolled.  Once you have received your monitor, please review the enclosed instructions. Your monitor  has already been registered assigning a specific monitor serial # to you.  Billing and Patient Assistance Program Information  We have supplied [employer] with any of your insurance information on file for billing purposes. [employer] offers a sliding scale Patient Assistance Program for patients that do not have  insurance, or whose insurance does not completely cover the cost of the ZIO monitor.  You must apply for the Patient Assistance Program to qualify for this discounted rate.  To apply, please call [employer] at [phone], select option 4, select option 2, ask to apply for  Patient Assistance Program. Meredeth will ask your household income, and how many people  are in your household. They will quote your out-of-pocket cost based on that information.  [employer] will also be able to set up a 42-month, interest-free payment plan if needed.  Applying the monitor   Shave hair from upper left chest.  Hold abrader disc by orange tab. Rub abrader in 40 strokes over the upper left chest as  indicated in your monitor instructions.  Clean area with 4 enclosed alcohol pads. Let dry.  Apply patch as indicated in monitor instructions. Patch will be placed under collarbone on left  side of chest with arrow pointing upward.  Rub patch adhesive wings for 2 minutes. Remove white label marked 1. Remove the white  label marked 2. Rub patch adhesive wings for 2 additional minutes.  While looking in a mirror, press and release button in center of patch. A small green light will  flash 3-4 times. This will be your only indicator that the monitor has been turned on.  Do not shower for the first 24 hours. You may shower after the first 24 hours.  Press the  button if you feel a symptom. You will hear a small click. Record Date, Time and  Symptom in the Patient Logbook.  When you are ready to remove the patch, follow instructions on the last 2 pages of Patient  Logbook. Stick patch monitor onto the last page of Patient Logbook.  Place Patient Logbook in the blue and white box. Use locking tab on box and tape box closed  securely. The blue and white box has prepaid postage on it. Please place it in the mailbox as  soon as possible. Your physician should have your test results approximately 7 days after the  monitor has been mailed back to [facility].  Call [facility] Customer Care at [phone] if you have questions regarding  your ZIO XT patch monitor. Call them immediately if you see an orange light blinking on your  monitor.  If your monitor falls off in less than 4 days, contact our Monitor department at [phone].  If your monitor becomes loose or falls off after 4 days call [employer] at [phone] for  suggestions on securing your monitor

## 2024-07-15 NOTE — Assessment & Plan Note (Signed)
 Increased frequency of PVCs which I think is contributing to her chest discomfort. Previous management with metoprolol  was effective but symptoms have recurred. No significant change on EKG.  - Increase metoprolol  succinate from 25 mg to 37.5 mg daily. - Order 7-day heart monitor to assess PVC burden and other arrhythmias. - Arrange echocardiogram to rule out structural heart disease.

## 2024-07-15 NOTE — Progress Notes (Unsigned)
 Enrolled for [employer] to mail a ZIO XT long term holter monitor to the patients address on file.   Dr. Jens Som to read.

## 2024-07-16 ENCOUNTER — Ambulatory Visit: Payer: Self-pay | Admitting: Physician Assistant

## 2024-07-16 ENCOUNTER — Encounter: Payer: Self-pay | Admitting: Cardiology

## 2024-07-16 DIAGNOSIS — R002 Palpitations: Secondary | ICD-10-CM

## 2024-07-16 LAB — CBC
Hematocrit: 39.2 % (ref 34.0–46.6)
Hemoglobin: 12.6 g/dL (ref 11.1–15.9)
MCH: 29.8 pg (ref 26.6–33.0)
MCHC: 32.1 g/dL (ref 31.5–35.7)
MCV: 93 fL (ref 79–97)
Platelets: 399 x10E3/uL (ref 150–450)
RBC: 4.23 x10E6/uL (ref 3.77–5.28)
RDW: 12 % (ref 11.7–15.4)
WBC: 6.9 x10E3/uL (ref 3.4–10.8)

## 2024-07-16 LAB — COMPREHENSIVE METABOLIC PANEL WITH GFR
ALT: 16 IU/L (ref 0–32)
AST: 14 IU/L (ref 0–40)
Albumin: 4.3 g/dL (ref 3.9–4.9)
Alkaline Phosphatase: 81 IU/L (ref 41–116)
BUN/Creatinine Ratio: 12 (ref 9–23)
BUN: 8 mg/dL (ref 6–24)
Bilirubin Total: 0.3 mg/dL (ref 0.0–1.2)
CO2: 22 mmol/L (ref 20–29)
Calcium: 9.5 mg/dL (ref 8.7–10.2)
Chloride: 102 mmol/L (ref 96–106)
Creatinine, Ser: 0.68 mg/dL (ref 0.57–1.00)
Globulin, Total: 2.5 g/dL (ref 1.5–4.5)
Glucose: 82 mg/dL (ref 70–99)
Potassium: 4.5 mmol/L (ref 3.5–5.2)
Sodium: 137 mmol/L (ref 134–144)
Total Protein: 6.8 g/dL (ref 6.0–8.5)
eGFR: 110 mL/min/1.73 (ref >59)

## 2024-07-16 LAB — TSH: TSH: 2.22 u[IU]/mL (ref 0.450–4.500)

## 2024-08-05 ENCOUNTER — Encounter: Payer: Self-pay | Admitting: Physician Assistant

## 2024-08-05 DIAGNOSIS — R42 Dizziness and giddiness: Secondary | ICD-10-CM

## 2024-08-05 DIAGNOSIS — R002 Palpitations: Secondary | ICD-10-CM

## 2024-08-08 ENCOUNTER — Telehealth (HOSPITAL_COMMUNITY): Payer: Self-pay | Admitting: *Deleted

## 2024-08-08 NOTE — Telephone Encounter (Signed)
 Left detailed instructions for stress echo. Hold metoprolol .

## 2024-08-15 ENCOUNTER — Encounter: Payer: Self-pay | Admitting: Physician Assistant

## 2024-08-15 ENCOUNTER — Ambulatory Visit (HOSPITAL_COMMUNITY)
Admission: RE | Admit: 2024-08-15 | Discharge: 2024-08-15 | Attending: Cardiovascular Disease | Admitting: Cardiovascular Disease

## 2024-08-15 ENCOUNTER — Ambulatory Visit (HOSPITAL_COMMUNITY)
Admission: RE | Admit: 2024-08-15 | Discharge: 2024-08-15 | Disposition: A | Source: Ambulatory Visit | Attending: Cardiovascular Disease | Admitting: Cardiovascular Disease

## 2024-08-15 DIAGNOSIS — R072 Precordial pain: Secondary | ICD-10-CM | POA: Insufficient documentation

## 2024-08-15 DIAGNOSIS — R5383 Other fatigue: Secondary | ICD-10-CM | POA: Diagnosis present

## 2024-08-15 DIAGNOSIS — R002 Palpitations: Secondary | ICD-10-CM | POA: Insufficient documentation

## 2024-08-15 DIAGNOSIS — R42 Dizziness and giddiness: Secondary | ICD-10-CM | POA: Diagnosis present

## 2024-08-15 DIAGNOSIS — R0602 Shortness of breath: Secondary | ICD-10-CM | POA: Insufficient documentation

## 2024-08-15 LAB — ECHOCARDIOGRAM STRESS TEST
Area-P 1/2: 2.91 cm2
S' Lateral: 2.8 cm

## 2024-08-15 MED ORDER — PERFLUTREN LIPID MICROSPHERE
1.0000 mL | INTRAVENOUS | Status: AC | PRN
  Administered 2024-08-15: 10:00:00 3 mL via INTRAVENOUS

## 2024-08-20 ENCOUNTER — Other Ambulatory Visit (HOSPITAL_COMMUNITY)

## 2024-08-27 ENCOUNTER — Ambulatory Visit (HOSPITAL_COMMUNITY)
Admission: RE | Admit: 2024-08-27 | Discharge: 2024-08-27 | Disposition: A | Discharge status: Home / Self Care | Source: Ambulatory Visit | Attending: Cardiovascular Disease | Admitting: Cardiovascular Disease

## 2024-08-27 DIAGNOSIS — R5383 Other fatigue: Secondary | ICD-10-CM | POA: Diagnosis not present

## 2024-08-27 DIAGNOSIS — R002 Palpitations: Secondary | ICD-10-CM | POA: Diagnosis not present

## 2024-08-27 DIAGNOSIS — R0602 Shortness of breath: Secondary | ICD-10-CM | POA: Insufficient documentation

## 2024-08-27 DIAGNOSIS — R072 Precordial pain: Secondary | ICD-10-CM | POA: Diagnosis not present

## 2024-08-27 LAB — ECHOCARDIOGRAM COMPLETE
Area-P 1/2: 3.15 cm2
S' Lateral: 2.7 cm

## 2024-09-03 ENCOUNTER — Encounter: Payer: Self-pay | Admitting: Family Medicine

## 2024-09-18 ENCOUNTER — Ambulatory Visit: Admitting: Family Medicine

## 2024-09-18 VITALS — BP 113/65 | HR 64 | Wt 197.6 lb

## 2024-09-18 DIAGNOSIS — E66811 Obesity, class 1: Secondary | ICD-10-CM

## 2024-09-18 MED ORDER — WEGOVY 1.5 MG PO TABS: 1.5000 mg | ORAL_TABLET | Freq: Every day | ORAL | 1 refills | Status: AC

## 2024-09-19 ENCOUNTER — Encounter: Payer: Self-pay | Admitting: Family Medicine

## 2024-09-19 DIAGNOSIS — E66811 Obesity, class 1: Secondary | ICD-10-CM | POA: Insufficient documentation

## 2024-09-19 NOTE — Progress Notes (Signed)
" ° °  Discussed the use of AI scribe software for clinical note transcription with the patient, who gave verbal consent to proceed.  History of Present Illness   Anita [name] is a 44 year old female who presents with concerns about weight management.  Weight management concerns - Desires to return to pre-pregnancy weight of approximately 160 pounds - Current weight is 197 pounds, increased from 193 pounds in 2023 - Weight has been stable over the past few years - Attributes weight gain in part to being busy caring for twins  Dietary interventions - Previously attempted ketogenic diet, which resulted in weight loss but was not sustainable  Medication considerations for weight loss - Not diabetic - Aware that lack of diabetes limits insurance coverage for weight loss medications such as Wegovy  and Ozempic - Partner has experience using these medications for diabetes and weight loss, but cost is a concern without coverage     PERTINENT  PMH / PSH: I have reviewed the patients medications, allergies, past medical and surgical history, smoking status.  Pertinent findings that relate to today's visit / issues include:   Physical Exam   MEASUREMENTS: Weight- 197.    Vital signs reviewed. BP 113/65 P =64 BMI=31.42 GENERAL: Well-developed, well-nourished, no acute distress. CARDIOVASCULAR: Regular rate and rhythm no murmur gallop or rub LUNGS: Clear to auscultation bilaterally, no rales or wheeze. ABDOMEN: Soft positive bowel sounds MSK: Movement of extremity x 4.      Assessment and Plan    Obesity Overweight with weight stable at 197 lbs. Goal weight 176 lbs. Previous keto diet effective but unsustainable. Discussed GLP-1 agonists; cost concerns without diabetes diagnosis. Emphasized exercise and dietary changes. - Prescribed Wegovy  with coupon for cost reduction. - Advised on healthy diet and regular exercise. - Scheduled follow-up in two months to assess progress and  medication tolerance.  30 minutes total time spent including: patient interview, examination, review of chart and history, development of differential diagnoses, decision about testing, discussion of treatment and management options with patient and coordination of care.           "

## 2024-10-04 NOTE — Progress Notes (Unsigned)
 "    HPI: Follow-up palpitations. Carotid Dopplers February 2023 showed no significant abnormalities. Note she has a smart watch and has recorded rhythm strips at the time of her symptoms that show sinus rhythm with PVCs.  Echocardiogram December 2025 showed normal LV function.  Stress echocardiogram December 2025 showed no ischemia.  Monitor December 2025 showed sinus rhythm, rare PAC, 6 beat run of SVT.  Symptoms associated with sinus rhythm.  Since last seen   Current Outpatient Medications  Medication Sig Dispense Refill   lamoTRIgine  (LAMICTAL ) 200 MG tablet Take 1.5 tablets (300 mg total) by mouth 2 (two) times daily. Appointment needed for further refills. 180 tablet 0   levETIRAcetam  (KEPPRA ) 500 MG tablet Take 2 tablets (1,000 mg total) by mouth 2 (two) times daily. (Patient taking differently: Take 1,000 mg by mouth 2 (two) times daily. 1000 mg in the morning and 1500 at night)     metoprolol  succinate (TOPROL -XL) 25 MG 24 hr tablet Take 1.5 tablets (37.5 mg total) by mouth daily. 135 tablet 3   minoxidil (LONITEN) 2.5 MG tablet 1 tablet Orally daily; Duration: 30 days     semaglutide -weight management (WEGOVY ) 1.5 MG tablet Take 1 tablet (1.5 mg total) by mouth daily. Daily in AM on an empty stomach with 4 oz of water. Do not eat or drink for 30 minutes after dose. 30 each 1   No current facility-administered medications for this visit.     Past Medical History:  Diagnosis Date   Anemia    Anxiety    Chest pain    ETT-Echocardiogram 08/15/24: Ex 750', 9.6 METs, no EKG changes, no echocardiogram changes; low risk   H/O: hysterectomy 07/07/2021   Heart murmur    Newborn product of in vitro fertilization (IVF) pregnancy    Palpitations    Monitor 06/2024: Sinus bradycardia, NSR, sinus tachycardia-average heart rate 71; rare PACs, 6 beat run of SVT; symptoms associated with NSR   Seizures (HCC)    Last seizure in 2013   Stroke (HCC)    TIA (transient ischemic attack)      Past Surgical History:  Procedure Laterality Date   ABDOMINAL HYSTERECTOMY     arm surgery     CESAREAN SECTION MULTI-GESTATIONAL WITH TUBAL N/A 08/25/2017   Procedure: CESAREAN SECTION MULTI-GESTATIONAL WITH TUBAL;  Surgeon: Marget Lenis, MD;  Location: [facility] SU[facility];  Service: Obstetrics;  Laterality: N/A;  Primary edc 09/08/17 need RNFA   HYSTEROSCOPY WITH D & C N/A 10/09/2015   Procedure: DILATATION AND CURETTAGE /HYSTEROSCOPY ;  Surgeon: Lenis Marget, MD;  Location: [facility] OR[facility];  Service: Gynecology;  Laterality: N/A;   LAPAROSCOPY N/A 10/09/2015   Procedure: LAPAROSCOPY DIAGNOSTIC with CO 2 laser  WITH CHROMOPERTUBATION AND LASER OF ENDOMETRIOSIS WITH LYSIS OF ADHESIONS;  Surgeon: Lenis Marget, MD;  Location: [facility] OR[facility];  Service: Gynecology;  Laterality: N/A;    Social History   Socioeconomic History   Marital status: Married    Spouse name: Not on file   Number of children: 2   Years of education: college   Highest education level: Associate degree: occupational, scientist, product/process development, or vocational program  Occupational History    Employer: [employer]  Tobacco Use   Smoking status: Never   Smokeless tobacco: Never  Substance and Sexual Activity   Alcohol use: No   Drug use: No   Sexual activity: Yes    Birth control/protection: Surgical  Other Topics Concern   Not on file  Social History Narrative   Patient  lives at home with her boyfriend. Patient works full time at [employer].   Education- College   Right handed.   Caffeine- coffee four times a week.   Trying to get pregnant.  05-13-15   Social Drivers of Health   Tobacco Use: Low Risk (09/30/2024)   Received from [facility]   Patient History    Smoking Tobacco Use: Never    Smokeless Tobacco Use: Never    Passive Exposure: Never  Financial Resource Strain: Low Risk (06/04/2024)   Overall Financial Resource Strain (CARDIA)    Difficulty of Paying Living Expenses: Not hard at all  Food Insecurity: No Food Insecurity  (06/04/2024)   Epic    Worried About Programme Researcher, Broadcasting/film/video in the Last Year: Never true    Ran Out of Food in the Last Year: Never true  Transportation Needs: No Transportation Needs (06/04/2024)   Epic    Lack of Transportation (Medical): No    Lack of Transportation (Non-Medical): No  Physical Activity: Inactive (06/04/2024)   Exercise Vital Sign    Days of Exercise per Week: 0 days    Minutes of Exercise per Session: Not on file  Stress: Stress Concern Present (06/04/2024)   [employer] of Occupational Health - Occupational Stress Questionnaire    Feeling of Stress: To some extent  Social Connections: Moderately Integrated (06/04/2024)   Social Connection and Isolation Panel    Frequency of Communication with Friends and Family: Three times a week    Frequency of Social Gatherings with Friends and Family: Once a week    Attends Religious Services: 1 to 4 times per year    Active Member of [employer] or Organizations: No    Attends Engineer, Structural: Not on file    Marital Status: Married  Catering Manager Violence: Not At Risk (10/26/2023)   Received from [facility]   HITS    Over the last 12 months how often did your partner physically hurt you?: Never    Over the last 12 months how often did your partner insult you or talk down to you?: Never    Over the last 12 months how often did your partner threaten you with physical harm?: Never    Over the last 12 months how often did your partner scream or curse at you?: Never  Depression (PHQ2-9): Low Risk (09/18/2024)   Depression (PHQ2-9)    PHQ-2 Score: 4  Alcohol Screen: Not on file  Housing: Unknown (06/04/2024)   Epic    Unable to Pay for Housing in the Last Year: No    Number of Times Moved in the Last Year: Not on file    Homeless in the Last Year: No  Utilities: Not At Risk (10/26/2023)   Received from [facility] Utilities    Threatened with loss of utilities: No  Health Literacy: Not on file     Family History  Problem Relation Age of Onset   Healthy Mother    Valvular heart disease Father    Aneurysm Father    Heart disease Maternal Grandmother    Bone cancer Maternal Grandfather     ROS: no fevers or chills, productive cough, hemoptysis, dysphasia, odynophagia, melena, hematochezia, dysuria, hematuria, rash, seizure activity, orthopnea, PND, pedal edema, claudication. Remaining systems are negative.  Physical Exam: Well-developed well-nourished in no acute distress.  Skin is warm and dry.  HEENT is normal.  Neck is supple.  Chest is clear to auscultation with normal  expansion.  Cardiovascular exam is regular rate and rhythm.  Abdominal exam nontender or distended. No masses palpated. Extremities show no edema. neuro grossly intact  ECG- personally reviewed  A/P  1 palpitations-previously recorded rhythm strips that showed sinus with PVCs associated with her symptoms.  Recent monitor showed symptoms associated with sinus rhythm.  Will continue Toprol .  2 history of atypical chest pain-recent stress echocardiogram showed no ischemia.  Redell Shallow, MD    "

## 2024-10-15 ENCOUNTER — Ambulatory Visit: Admitting: Cardiology

## 2024-11-27 ENCOUNTER — Ambulatory Visit: Admitting: Family Medicine
